# Patient Record
Sex: Female | Born: 1950 | Race: White | Hispanic: No | Marital: Married | Smoking: Never smoker
Health system: Southern US, Community
[De-identification: ages and names within clinical notes are randomized; demographics above are authoritative.]

## PROBLEM LIST (undated history)

## (undated) ENCOUNTER — Emergency Department (HOSPITAL_COMMUNITY): Admission: EM | Payer: PPO | Source: Home / Self Care

## (undated) DIAGNOSIS — R002 Palpitations: Secondary | ICD-10-CM

## (undated) DIAGNOSIS — K7460 Unspecified cirrhosis of liver: Secondary | ICD-10-CM

## (undated) DIAGNOSIS — G43109 Migraine with aura, not intractable, without status migrainosus: Secondary | ICD-10-CM

## (undated) DIAGNOSIS — K7581 Nonalcoholic steatohepatitis (NASH): Secondary | ICD-10-CM

## (undated) DIAGNOSIS — I480 Paroxysmal atrial fibrillation: Secondary | ICD-10-CM

## (undated) DIAGNOSIS — I10 Essential (primary) hypertension: Secondary | ICD-10-CM

## (undated) HISTORY — DX: Palpitations: R00.2

## (undated) HISTORY — DX: Paroxysmal atrial fibrillation: I48.0

---

## 1992-04-02 HISTORY — PX: BREAST BIOPSY: SHX20

## 1998-05-23 ENCOUNTER — Emergency Department (HOSPITAL_COMMUNITY): Admission: EM | Admit: 1998-05-23 | Discharge: 1998-05-23 | Payer: Self-pay | Admitting: Emergency Medicine

## 1998-11-09 ENCOUNTER — Emergency Department (HOSPITAL_COMMUNITY): Admission: EM | Admit: 1998-11-09 | Discharge: 1998-11-09 | Payer: Self-pay | Admitting: Internal Medicine

## 1999-01-08 ENCOUNTER — Emergency Department (HOSPITAL_COMMUNITY): Admission: EM | Admit: 1999-01-08 | Discharge: 1999-01-08 | Payer: Self-pay | Admitting: Emergency Medicine

## 1999-01-08 ENCOUNTER — Encounter: Payer: Self-pay | Admitting: Emergency Medicine

## 1999-06-21 ENCOUNTER — Other Ambulatory Visit: Admission: RE | Admit: 1999-06-21 | Discharge: 1999-06-21 | Payer: Self-pay | Admitting: Obstetrics and Gynecology

## 2000-05-06 ENCOUNTER — Encounter: Payer: Self-pay | Admitting: Emergency Medicine

## 2000-05-06 ENCOUNTER — Emergency Department (HOSPITAL_COMMUNITY): Admission: EM | Admit: 2000-05-06 | Discharge: 2000-05-06 | Payer: Self-pay | Admitting: Emergency Medicine

## 2000-09-02 ENCOUNTER — Encounter: Admission: RE | Admit: 2000-09-02 | Discharge: 2000-09-02 | Payer: Self-pay | Admitting: Obstetrics and Gynecology

## 2000-09-02 ENCOUNTER — Encounter: Payer: Self-pay | Admitting: Obstetrics and Gynecology

## 2001-02-24 ENCOUNTER — Emergency Department (HOSPITAL_COMMUNITY): Admission: EM | Admit: 2001-02-24 | Discharge: 2001-02-24 | Payer: Self-pay | Admitting: Emergency Medicine

## 2001-02-24 ENCOUNTER — Encounter: Payer: Self-pay | Admitting: Emergency Medicine

## 2001-09-23 ENCOUNTER — Other Ambulatory Visit: Admission: RE | Admit: 2001-09-23 | Discharge: 2001-09-23 | Payer: Self-pay | Admitting: Obstetrics and Gynecology

## 2001-09-26 ENCOUNTER — Encounter: Payer: Self-pay | Admitting: Obstetrics and Gynecology

## 2001-09-26 ENCOUNTER — Encounter: Admission: RE | Admit: 2001-09-26 | Discharge: 2001-09-26 | Payer: Self-pay | Admitting: Obstetrics and Gynecology

## 2001-10-01 ENCOUNTER — Encounter (INDEPENDENT_AMBULATORY_CARE_PROVIDER_SITE_OTHER): Payer: Self-pay | Admitting: *Deleted

## 2001-10-01 ENCOUNTER — Ambulatory Visit (HOSPITAL_COMMUNITY): Admission: RE | Admit: 2001-10-01 | Discharge: 2001-10-01 | Payer: Self-pay | Admitting: Gastroenterology

## 2001-11-07 ENCOUNTER — Encounter: Admission: RE | Admit: 2001-11-07 | Discharge: 2001-11-07 | Payer: Self-pay | Admitting: Obstetrics and Gynecology

## 2001-11-07 ENCOUNTER — Encounter: Payer: Self-pay | Admitting: Obstetrics and Gynecology

## 2002-10-11 ENCOUNTER — Emergency Department (HOSPITAL_COMMUNITY): Admission: EM | Admit: 2002-10-11 | Discharge: 2002-10-11 | Payer: Self-pay | Admitting: Emergency Medicine

## 2002-11-16 ENCOUNTER — Other Ambulatory Visit: Admission: RE | Admit: 2002-11-16 | Discharge: 2002-11-16 | Payer: Self-pay | Admitting: Obstetrics and Gynecology

## 2002-12-29 ENCOUNTER — Encounter: Payer: Self-pay | Admitting: Obstetrics and Gynecology

## 2002-12-29 ENCOUNTER — Encounter: Admission: RE | Admit: 2002-12-29 | Discharge: 2002-12-29 | Payer: Self-pay | Admitting: Obstetrics and Gynecology

## 2003-03-23 ENCOUNTER — Emergency Department (HOSPITAL_COMMUNITY): Admission: EM | Admit: 2003-03-23 | Discharge: 2003-03-23 | Payer: Self-pay | Admitting: Emergency Medicine

## 2003-06-02 ENCOUNTER — Encounter: Admission: RE | Admit: 2003-06-02 | Discharge: 2003-06-02 | Payer: Self-pay | Admitting: Internal Medicine

## 2003-12-23 ENCOUNTER — Emergency Department (HOSPITAL_COMMUNITY): Admission: EM | Admit: 2003-12-23 | Discharge: 2003-12-23 | Payer: Self-pay | Admitting: Emergency Medicine

## 2004-02-14 ENCOUNTER — Emergency Department (HOSPITAL_COMMUNITY): Admission: EM | Admit: 2004-02-14 | Discharge: 2004-02-14 | Payer: Self-pay | Admitting: Family Medicine

## 2004-05-05 ENCOUNTER — Emergency Department (HOSPITAL_COMMUNITY): Admission: EM | Admit: 2004-05-05 | Discharge: 2004-05-05 | Payer: Self-pay | Admitting: Emergency Medicine

## 2004-06-05 ENCOUNTER — Other Ambulatory Visit: Admission: RE | Admit: 2004-06-05 | Discharge: 2004-06-05 | Payer: Self-pay | Admitting: Obstetrics and Gynecology

## 2005-02-13 ENCOUNTER — Encounter: Admission: RE | Admit: 2005-02-13 | Discharge: 2005-02-13 | Payer: Self-pay | Admitting: Obstetrics and Gynecology

## 2005-03-12 HISTORY — PX: NM MYOVIEW LTD: HXRAD82

## 2006-06-12 ENCOUNTER — Emergency Department (HOSPITAL_COMMUNITY): Admission: EM | Admit: 2006-06-12 | Discharge: 2006-06-12 | Payer: Self-pay | Admitting: Emergency Medicine

## 2006-09-10 ENCOUNTER — Emergency Department (HOSPITAL_COMMUNITY): Admission: EM | Admit: 2006-09-10 | Discharge: 2006-09-10 | Payer: Self-pay | Admitting: Emergency Medicine

## 2006-11-28 ENCOUNTER — Encounter: Admission: RE | Admit: 2006-11-28 | Discharge: 2006-11-28 | Payer: Self-pay | Admitting: Obstetrics and Gynecology

## 2007-08-26 ENCOUNTER — Emergency Department (HOSPITAL_COMMUNITY): Admission: EM | Admit: 2007-08-26 | Discharge: 2007-08-26 | Payer: Self-pay | Admitting: Family Medicine

## 2007-11-05 ENCOUNTER — Observation Stay (HOSPITAL_COMMUNITY): Admission: EM | Admit: 2007-11-05 | Discharge: 2007-11-05 | Payer: Self-pay | Admitting: *Deleted

## 2007-11-05 HISTORY — PX: CARDIAC CATHETERIZATION: SHX172

## 2007-11-26 HISTORY — PX: US ECHOCARDIOGRAPHY: HXRAD669

## 2008-06-17 ENCOUNTER — Encounter: Admission: RE | Admit: 2008-06-17 | Discharge: 2008-06-17 | Payer: Self-pay | Admitting: Obstetrics and Gynecology

## 2010-02-15 ENCOUNTER — Emergency Department (HOSPITAL_COMMUNITY): Admission: EM | Admit: 2010-02-15 | Discharge: 2010-02-15 | Payer: Self-pay | Admitting: Family Medicine

## 2010-03-11 ENCOUNTER — Emergency Department (HOSPITAL_COMMUNITY)
Admission: EM | Admit: 2010-03-11 | Discharge: 2010-03-11 | Payer: Self-pay | Source: Home / Self Care | Admitting: Family Medicine

## 2010-04-07 ENCOUNTER — Encounter
Admission: RE | Admit: 2010-04-07 | Discharge: 2010-04-07 | Payer: Self-pay | Source: Home / Self Care | Attending: Obstetrics and Gynecology | Admitting: Obstetrics and Gynecology

## 2010-08-15 NOTE — Discharge Summary (Signed)
NAMEFLOREE, ZUNIGA           ACCOUNT NO.:  [id number]   MEDICAL RECORD NO.:  [id number]          PATIENT TYPE:  INP   LOCATION:  2902                         FACILITY:  [facility]   PHYSICIAN:  Madaline Savage, M.D.DATE OF BIRTH:  [birth year]   DATE OF ADMISSION:  11/05/2007  DATE OF DISCHARGE:  11/05/2007                               DISCHARGE SUMMARY   Ms. Isip is a 60 year old Caucasian female patient of Dr. Clarene Duke  presented to the emergency room earlier this morning with complaints of  chest pain.  For the last four days, she had experienced the chest pain  and tightness, smothering sensation usually such episodes would awaken  her from sleep in the morning and then last for several hours and  usually it is not associated with any activity or exertion.  Last night,  she experienced chest tightness with shortness of breath and smothering  sensation.  It was continued for a few hours and eventually the patient  decided to come to the emergency room for evaluation.  She was seen by  Charmian Muff, nurse practitioner and then later Dr. Allyson Sabal saw the patient  and the decision was made to proceed with coronary angiography for  definitive diagnosis.   Dr. Elsie Lincoln performed coronary angio.  It revealed ejection fraction of  60-70%, all the coronaries were free of disease.   Dr. Elsie Lincoln decided to keep the patient in the bed rest and then let him  go home after her bedrest expires if she remains asymptomatic after  ambulation.   HOSPITAL LABORATORY DATA:  Her cardiac enzymes were negative x2.  Hemoglobin was 13.4, hematocrit 39.7, white blood cell count 7.4,  platelet count 251, sodium 142, potassium 3.5, chloride 105, bicarbonate  26, glucose 95, BUN 6, and creatinine 0.78.   DISCHARGE DIAGNOSES:  1. Chest pain ruled out for myocardial infarction status post      catheterization with normal coronaries today.  2. Hypertension.  3. Paroxysmal atrial fibrillation.  4. Mitral  valve prolapse.  5. Possible gastroesophageal reflux disease.   DISCHARGE MEDICATIONS:  1. Metoprolol 100 mg daily.  2. Benicar 20 mg daily.  3. Aspirin 81 mg daily.  4. Allegra 180 mg daily.  5. Vitamin D one pill daily.  6. Protonix 40 mg daily.   The patient will be seen by Dr. Clarene Duke in our office in two weeks and  she was instructed to call and make an appointment.  She was advised to  avoid driving or lifting greater than 5 pounds for three days post cath  and call our office if there are any problems with groin puncture site.      Raymon Mutton, P.A.       ______________________________  Madaline Savage, M.D.    MK/MEDQ  D:  11/05/2007  T:  11/06/2007  Job:  [id number]   cc:   [facility]  Thereasa Solo. Little, M.D.

## 2010-08-15 NOTE — Cardiovascular Report (Signed)
[name], Amanda Frost           ACCOUNT NO.:  [id number]   MEDICAL RECORD NO.:  [id number]          PATIENT TYPE:  INP   LOCATION:  2902                         FACILITY:  [facility]   PHYSICIAN:  Madaline Savage, M.D.DATE OF BIRTH:  1950-12-24   DATE OF PROCEDURE:  11/05/2007  DATE OF DISCHARGE:  11/05/2007                            CARDIAC CATHETERIZATION   PROCEDURES PERFORMED:  1. Selective coronary angiography by Judkins technique.  2. Retrograde left heart catheterization.  3. Left ventricular angiography.   COMPLICATIONS:  None.   ENTRY SITE:  Right femoral.   DYE USED:  Omnipaque.   CATHETERS USED:  5-French Judkins catheters.   COMPLICATIONS:  None.   PATIENT PROFILE:  The patient is a 60 year old white female who is a  medical patient of Dr. Waynard Edwards and a cardiology patient of Dr. Julieanne Manson who has a history of mitral prolapse, palpitations, and  hypertension.  Today, she presented to the [facility] Emergency Room  with chest pain and shortness of breath.  Her EKG showed a normal sinus  rhythm at a rate of 62 and a normal EKG.  Point-of-care markers have  shown a troponin of 0.05 or less.  She has a potassium of 3.3.  Her  creatinine was 0.88.  She was evaluated by Dr. Nanetta Batty in the  emergency room and felt to be in need of catheterization according to  Dr. Hazle Coca evaluation and I performed that procedure in Cath Lab #4  electively and without any complication.  The patient's coronary  arteries were angiographically patent throughout.  The LVEF was 60-70%.  There was no mitral regurgitation.   FINAL IMPRESSIONS:  1. Recent chest pain with several cardiac risk factors.  2. Angiographically patent coronary arteries with a left circumflex      dominant system.  3. Normal left ventricular systolic function.   PLAN:  The patient will be assessed for possible discharge later today  and will follow up with Dr. Waynard Edwards and Dr. Clarene Duke.     ______________________________  Madaline Savage, M.D.     WHG/MEDQ  D:  11/05/2007  T:  11/06/2007  Job:  [id number]   cc:   Loraine Leriche A. Perini, M.D.  Thereasa Solo. Little, M.D.  [facility]

## 2010-08-18 NOTE — Procedures (Signed)
[place]  Patient:    Amanda Frost, Amanda Frost Visit Number: [id number] MRN: [id number]          Service Type: END Location: ENDO Attending Physician:  Rich Brave Dictated by:   Florencia Reasons, M.D. Proc. Date: 10/01/01 Admit Date:  10/01/2001 Discharge Date: 10/01/2001   CC:         Rodrigo Ran, M.D.  Juluis Mire, M.D.   Procedure Report  PROCEDURE:  Colonoscopy with biopsy.  INDICATION:  This is a 60 year old female who had a recent change in bowel habits, which is now pretty well resolved, but who also was desirous of colon cancer screening.  FINDINGS:  Diminutive sessile polyp, biopsied.  Redundant colon.  DESCRIPTION OF PROCEDURE:  The nature, purpose, and risks of the procedure had been discussed with the patient, who provided written consent.  Sedation was fentanyl 80 mcg and Versed 8 mg IV without arrhythmias or desaturation.  The Olympus adjustable-tension pediatric video colonoscope was advanced with considerable looping, such that on future occasions the adult colonoscope might work better.  With the patient in the supine position, right lateral decubitus position, and eventually back in the left lateral decubitus position and with copious external abdominal compression, we were able to reach the base of the cecum and pullback was then performed.  The quality of the prep was quite good, although there was some stool film in the proximal colon.  It is not felt that any significant or sizeable lesions would have been missed.  In the midcolon was a sessile 2-3 mm polyp removed by cold biopsy.  No large polyps, cancer, colitis, vascular malformations, or diverticulosis were noted, and retroflexion in the rectum was unremarkable.  The patient tolerated the procedure well, and there were no apparent complications.  IMPRESSION:  Diminutive sessile polyp, removed as described above.  PLAN:  Await pathology.   Consider follow-up colonoscopy in five years if the patient is adenomatous in character, otherwise perhaps a flexible sigmoidoscopy in five years. Dictated by:   Florencia Reasons, M.D. Attending Physician:  Rich Brave DD:  10/01/01 TD:  10/04/01 Job: [id number]

## 2010-12-29 LAB — BASIC METABOLIC PANEL
BUN: 6
Chloride: 103
GFR calc Af Amer: >60
GFR calc non Af Amer: >60
Potassium: 3.3 — ABNORMAL LOW
Sodium: 141

## 2010-12-29 LAB — COMPREHENSIVE METABOLIC PANEL
ALT: 27
AST: 30
CO2: 26
Calcium: 9.8
Chloride: 105
Creatinine, Ser: 0.78
GFR calc Af Amer: >60
GFR calc non Af Amer: >60
Glucose, Bld: 95
Total Bilirubin: 0.9

## 2010-12-29 LAB — CBC
HCT: 39.7
Hemoglobin: 13.4
MCHC: 33.7
MCV: 92.4
Platelets: 251
RBC: 4.29
RDW: 13.9
WBC: 7.4

## 2010-12-29 LAB — DIFFERENTIAL
Basophils Absolute: 0.1
Basophils Relative: 1
Eosinophils Absolute: 0.1
Eosinophils Relative: 1
Lymphocytes Relative: 40
Lymphs Abs: 3
Monocytes Absolute: 0.6
Monocytes Relative: 8
Neutro Abs: 3.7
Neutrophils Relative %: 50

## 2010-12-29 LAB — CARDIAC PANEL(CRET KIN+CKTOT+MB+TROPI)
CK, MB: 0.8
Total CK: 81
Troponin I: <0.01

## 2010-12-29 LAB — MAGNESIUM: Magnesium: 1.9

## 2010-12-29 LAB — POCT CARDIAC MARKERS
CKMB, poc: <1 — ABNORMAL LOW
Myoglobin, poc: 45.6
Troponin i, poc: <0.05

## 2010-12-29 LAB — CK TOTAL AND CKMB (NOT AT ARMC)
CK, MB: 0.7
Total CK: 98

## 2010-12-29 LAB — LIPID PANEL
LDL Cholesterol: 71
Triglycerides: 145
VLDL: 29

## 2010-12-29 LAB — PROTIME-INR
INR: 0.9
Prothrombin Time: 11.9

## 2010-12-29 LAB — BASIC METABOLIC PANEL WITH GFR
CO2: 27
Calcium: 9.9
Creatinine, Ser: 0.88
Glucose, Bld: 122 — ABNORMAL HIGH

## 2010-12-29 LAB — APTT: aPTT: 25

## 2011-07-11 ENCOUNTER — Other Ambulatory Visit: Payer: Self-pay | Admitting: Obstetrics and Gynecology

## 2011-07-11 DIAGNOSIS — Z1231 Encounter for screening mammogram for malignant neoplasm of breast: Secondary | ICD-10-CM

## 2011-08-02 ENCOUNTER — Ambulatory Visit
Admission: RE | Admit: 2011-08-02 | Discharge: 2011-08-02 | Disposition: A | Discharge status: Home / Self Care | Payer: 59 | Source: Ambulatory Visit | Attending: Obstetrics and Gynecology | Admitting: Obstetrics and Gynecology

## 2011-08-02 DIAGNOSIS — Z1231 Encounter for screening mammogram for malignant neoplasm of breast: Secondary | ICD-10-CM

## 2012-03-25 ENCOUNTER — Encounter (HOSPITAL_COMMUNITY): Payer: Self-pay | Admitting: *Deleted

## 2012-03-25 ENCOUNTER — Emergency Department (HOSPITAL_COMMUNITY)
Admission: EM | Admit: 2012-03-25 | Discharge: 2012-03-25 | Disposition: A | Discharge status: Home / Self Care | Payer: 59 | Attending: Emergency Medicine | Admitting: Emergency Medicine

## 2012-03-25 DIAGNOSIS — Z79899 Other long term (current) drug therapy: Secondary | ICD-10-CM | POA: Insufficient documentation

## 2012-03-25 DIAGNOSIS — H539 Unspecified visual disturbance: Secondary | ICD-10-CM

## 2012-03-25 DIAGNOSIS — I10 Essential (primary) hypertension: Secondary | ICD-10-CM | POA: Insufficient documentation

## 2012-03-25 DIAGNOSIS — Z8679 Personal history of other diseases of the circulatory system: Secondary | ICD-10-CM | POA: Insufficient documentation

## 2012-03-25 HISTORY — DX: Migraine with aura, not intractable, without status migrainosus: G43.109

## 2012-03-25 HISTORY — DX: Essential (primary) hypertension: I10

## 2012-03-25 NOTE — ED Notes (Signed)
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 Never had migraine pain with that, only vision changes with flashing of lights and "zig zag vision". This am began having same symptoms with visual field changes. Took BP med and ASA this am and symptoms went away on way to ED. Pt became anxious and wanted further eval.

## 2012-03-25 NOTE — ED Provider Notes (Signed)
History     CSN: [id number]  Arrival date & time 03/25/12  1039   First MD Initiated Contact with Patient 03/25/12 1116      Chief Complaint  Patient presents with  . Visual Field Change    (Consider location/radiation/quality/duration/timing/severity/associated sxs/prior treatment) HPI  61 year old female with hx of ocular migraine and hypertension presents for evaluation of visual changes.  Pt reports having hx of ocular migraine 25 years ago with only vision changes with flashes of lights and zig zag vision which lasted for 2 weeks and resolved, no headache.  Today she developed same visual field changes with zig zag vision to both eyes, as if she was staring at the bright light.  Her sxs lasted for 10 minutes and resolved without treatment.  No headache.  She denies fever, headache, double vision, curtain closing vision, eye pain, numbness or weakness.  No changes in eye glasses, no recent trauma.  No n/v/d.  She reports she has been performing a lot of housework duty and only has 2 hrs of sleep.  She has an eye specialist, Dr. Elmer Picker.  No prior hx of stroke. No medication changes.    Past Medical History  Diagnosis Date  . Ocular migraine   . Hypertension     History reviewed. No pertinent past surgical history.  No family history on file.  History  Substance Use Topics  . Smoking status: Never Smoker   . Smokeless tobacco: Not on file  . Alcohol Use: No    OB History    Grav Para Term Preterm Abortions TAB SAB Ect Mult Living                  Review of Systems  Constitutional: Negative for chills.  HENT: Negative for ear pain, sore throat and neck pain.   Eyes: Positive for visual disturbance. Negative for photophobia, pain, discharge, redness and itching.  Skin: Negative for rash.  Neurological: Negative for dizziness, tremors, speech difficulty, weakness, numbness and headaches.    Allergies  Sulfa antibiotics  Home Medications   Current Outpatient Rx   Name  Route  Sig  Dispense  Refill  . ASPIRIN 325 MG PO TABS   Oral   Take 325 mg by mouth every 6 (six) hours as needed. Only takes if she feels if she's having a stroke.  Happened last 24 years ago         . METOPROLOL SUCCINATE ER 100 MG PO TB24   Oral   Take 100 mg by mouth daily. Take with or immediately following a meal.         . OLMESARTAN MEDOXOMIL 20 MG PO TABS   Oral   Take 20 mg by mouth daily.         Marland Kitchen VITAMIN D (ERGOCALCIFEROL) 50000 UNITS PO CAPS   Oral   Take 50,000 Units by mouth every 7 (seven) days. Fridays or Saturdays           BP 156/71  Pulse 64  Temp 98.2 F (36.8 C) (Oral)  Resp 18  SpO2 100%  Physical Exam  Nursing note and vitals reviewed. Constitutional: She is oriented to person, place, and time. She appears well-developed and well-nourished.  HENT:  Head: Normocephalic and atraumatic.  Left Ear: External ear normal.  Mouth/Throat: Oropharynx is clear and moist. No oropharyngeal exudate.  Eyes: Conjunctivae normal, EOM and lids are normal. Pupils are equal, round, and reactive to light. No foreign bodies found. Right eye  exhibits no discharge. No foreign body present in the right eye. Left eye exhibits no discharge. No foreign body present in the left eye. Right conjunctiva is not injected. Left conjunctiva is not injected. No scleral icterus. Right eye exhibits normal extraocular motion and no nystagmus. Left eye exhibits normal extraocular motion and no nystagmus. Pupils are equal.  Fundoscopic exam:      The right eye shows no arteriolar narrowing, no hemorrhage and no papilledema.       The left eye shows no arteriolar narrowing, no hemorrhage and no papilledema.  Neck: Neck supple.  Lymphadenopathy:    She has no cervical adenopathy.  Neurological: She is alert and oriented to person, place, and time.  Skin: Skin is warm. No rash noted.    ED Course  Procedures (including critical care time)  Labs Reviewed - No data to  display No results found.   No diagnosis found.  1. Vision changes  MDM  Pt with transient vision changes lasting 10 minutes, now at baseline.  No other complaints.  Has similar sxs 25 years ago which was thought to be due to hormonal changes after child birth.    She has normal visual acuity (20/20 corrected).  Eye examination unremarkable.  No focal neuro deficits. BP unremarkable.  I discussed with my attending, and we felt pt will benefit f/u with her ophthalmologist outpatient for further care.  No treatment here today.   BP 156/71  Pulse 64  Temp 98.2 F (36.8 C) (Oral)  Resp 18  SpO2 100%        Fayrene Helper, PA-C 03/25/12 1220

## 2012-03-25 NOTE — ED Provider Notes (Signed)
Medical screening examination/treatment/procedure(s) were performed by non-physician practitioner and as supervising physician I was immediately available for consultation/collaboration.  Ethelda Chick, MD 03/25/12 [phone]

## 2012-09-12 ENCOUNTER — Other Ambulatory Visit: Payer: Self-pay | Admitting: Gastroenterology

## 2012-10-26 ENCOUNTER — Encounter: Payer: Self-pay | Admitting: *Deleted

## 2012-10-28 ENCOUNTER — Encounter: Payer: Self-pay | Admitting: Cardiovascular Disease

## 2012-10-29 ENCOUNTER — Encounter: Payer: Self-pay | Admitting: Cardiovascular Disease

## 2012-10-29 ENCOUNTER — Ambulatory Visit (INDEPENDENT_AMBULATORY_CARE_PROVIDER_SITE_OTHER): Payer: 59 | Admitting: Cardiovascular Disease

## 2012-10-29 VITALS — BP 160/92 | HR 61 | Resp 16 | Ht 68.0 in | Wt 182.9 lb

## 2012-10-29 DIAGNOSIS — R002 Palpitations: Secondary | ICD-10-CM

## 2012-10-29 DIAGNOSIS — I471 Supraventricular tachycardia: Secondary | ICD-10-CM

## 2012-10-29 DIAGNOSIS — I10 Essential (primary) hypertension: Secondary | ICD-10-CM

## 2012-10-29 NOTE — Patient Instructions (Signed)
Your physician recommends that you schedule a follow-up appointment in: 1 year. Please take your blood pressure medications every day, preferably around the same time every day. Abruptly skipping metoprolol can cause rebound hypertension.

## 2012-10-29 NOTE — Progress Notes (Signed)
Patient ID: Amanda Frost, female   DOB: April 19, 1950, 62 y.o.   MRN: [id number]     Reason for office visit Hypertension, palpitations, paroxysmal atrial tach  The patient has long-standing systemic hypertension with little if any structural heart disease. There is borderline LVH by echo but normal diastolic function and normal left atrial size. The ventricular ejection fraction was normal. She had normal coronary arteries by previous cardiac catheterization.  As always she is troubled by intermittent palpitations, often related to caffeine use. These are uniformly brief and are not associated with dyspnea, chest pain, syncope or focal neurological symptoms.  She is worried because she has seen numerous commercials for products for atrial fibrillation on television. She is also worried because both her mother and maternal grandmother had strokes.  She has never had a stroke or TIA. She is concerned that she may have atrial fibrillation. For similar symptoms and event monitor was done several years ago that showed frequent PACs and occasional very brief bursts of atrial couplets and rare atrial tachycardia. Atrial fibrillation was not seen.  Pressure is high today. She did not take her medications yesterday. States that she'll sometimes skip her medicines and her blood pressure is "low" (she is actually reporting normal range values that she considers to be low)    Allergies  Allergen Reactions  . Sulfa Antibiotics Other (See Comments)    Unsure, childhood reaction    Current Outpatient Prescriptions  Medication Sig Dispense Refill  . fexofenadine (ALLEGRA) 180 MG tablet Take 180 mg by mouth daily as needed.      . metoprolol succinate (TOPROL-XL) 100 MG 24 hr tablet Take 100 mg by mouth daily. Take with or immediately following a meal.      . Multiple Vitamin (MULTIVITAMIN) tablet Take 1 tablet by mouth daily.      Marland Kitchen olmesartan (BENICAR) 20 MG tablet Take 20 mg by mouth daily.        . Vitamin D, Ergocalciferol, (DRISDOL) 50000 UNITS CAPS Take 50,000 Units by mouth every 7 (seven) days. Fridays or Saturdays       No current facility-administered medications for this visit.    Past Medical History  Diagnosis Date  . Ocular migraine   . Hypertension   . Palpitations     PAC's,PVC's,atrial tachycardia    Past Surgical History  Procedure Laterality Date  . Breast biopsy  1994  . Cardiac catheterization  11/05/2007    normal LV fx,patent coronary arteries  . US echocardiography  11/26/2007    mild LVH,trace MR,TR  . Nm myoview ltd  03/12/2005    normal    Family History  Problem Relation Age of Onset  . Diabetes Mother   . Hypertension Mother   . Kidney disease Mother   . Diabetes Father   . Hypertension Father     History   Social History  . Marital Status: Married    Spouse Name: N/A    Number of Children: N/A  . Years of Education: N/A   Occupational History  . Not on file.   Social History Main Topics  . Smoking status: Never Smoker   . Smokeless tobacco: Not on file  . Alcohol Use: No  . Drug Use: No  . Sexually Active:    Other Topics Concern  . Not on file   Social History Narrative  . No narrative on file    Review of systems: The patient specifically denies any chest pain at rest or with exertion,  dyspnea at rest or with exertion, orthopnea, paroxysmal nocturnal dyspnea, syncope, focal neurological deficits, intermittent claudication, lower extremity edema, unexplained weight gain, cough, hemoptysis or wheezing.  The patient also denies abdominal pain, nausea, vomiting, dysphagia, diarrhea, constipation, polyuria, polydipsia, dysuria, hematuria, frequency, urgency, abnormal bleeding or bruising, fever, chills, unexpected weight changes, mood swings, change in skin or hair texture, change in voice quality, auditory or visual problems, allergic reactions or rashes, new musculoskeletal complaints other than usual "aches and  pains".   PHYSICAL EXAM BP 160/92  Pulse 61  Resp 16  Ht 5\' 8"  (1.727 m)  Wt 182 lb 14.4 oz (82.963 kg)  BMI 27.82 kg/m2  General: Alert, oriented x3, no distress Head: no evidence of trauma, PERRL, EOMI, no exophtalmos or lid lag, no myxedema, no xanthelasma; normal ears, nose and oropharynx Neck: normal jugular venous pulsations and no hepatojugular reflux; brisk carotid pulses without delay and no carotid bruits Chest: clear to auscultation, no signs of consolidation by percussion or palpation, normal fremitus, symmetrical and full respiratory excursions Cardiovascular: normal position and quality of the apical impulse, regular rhythm, normal first and second heart sounds, no murmurs, rubs or gallops Abdomen: no tenderness or distention, no masses by palpation, no abnormal pulsatility or arterial bruits, normal bowel sounds, no hepatosplenomegaly Extremities: no clubbing, cyanosis or edema; 2+ radial, ulnar and brachial pulses bilaterally; 2+ right femoral, posterior tibial and dorsalis pedis pulses; 2+ left femoral, posterior tibial and dorsalis pedis pulses; no subclavian or femoral bruits Neurological: grossly nonfocal   EKG: NSR, normal  Lipid Panel     Component Value Date/Time   CHOL  Value: 136        ATP III CLASSIFICATION:  <200     mg/dL   Desirable  161-096  mg/dL   Borderline High  >=045    mg/dL   High 4/0/9811 9147   TRIG 145 11/05/2007 0821   HDL 36* 11/05/2007 0821   CHOLHDL 3.8 11/05/2007 0821   VLDL 29 11/05/2007 0821   LDLCALC  Value: 71        Total Cholesterol/HDL:CHD Risk Coronary Heart Disease Risk Table                     Men   Women  1/2 Average Risk   3.4   3.3 11/05/2007 0821    BMET    Component Value Date/Time   NA 142 11/05/2007 0821   K 3.5 11/05/2007 0821   CL 105 11/05/2007 0821   CO2 26 11/05/2007 0821   GLUCOSE 95 11/05/2007 0821   BUN 6 11/05/2007 0821   CREATININE 0.78 11/05/2007 0821   CALCIUM 9.8 11/05/2007 0821   GFRNONAA >60 11/05/2007 0821   GFRAA   Value: >60        The eGFR has been calculated using the MDRD equation. This calculation has not been validated in all clinical 11/05/2007 0821     ASSESSMENT AND PLAN Paroxysmal atrial tachycardia Her palpitations are generally brief and this would be unusual with atrial fibrillation. She has very little if any structural heart disease. She does not have any serious valvular problems. Left atrial size is normal. Left ventricular systolic function is normal. I suspect that her symptoms are, as before related to premature atrial contractions and possibly brief bursts of atrial tachycardia. I cannot fully exclude that she's having bursts of atrial fibrillation and have offered monitoring either with an external event monitor or with an implantable loop recorder. I also discussed the  fact that she is at low risk of embolic complications, even if she had atrial fibrillation. Both by the CHADS2 and CHADS2Vasc2 scoring system she would fall in the category that would takes only aspirin for embolism prevention. After we discussed these considerations she feels reassured and rather not have any additional testing  HTN (hypertension) Her blood pressure is high today because she missed her previous dose of medications. She tells me that sometimes at home if she sees that her blood pressure is low normal she'll simply skip the medicine. I have told her that this is not a wise thing to do. She may have rebound hypertension from abrupt cessation of beta blocker therapy. She does not seem to have any symptoms of hypotension I would simply recommend taking the medications on a regular daily basis. If her medications need to be adjusted should be done chronically and not intermittently. View the importance of sodium restriction, weight loss, regular exercise and a general healthy lifestyle.  Orders Placed This Encounter  Procedures  . EKG 12-Lead   No orders of the defined types were placed in this encounter.     Junious Silk, MD, [facility] [phone] office [phone] pager

## 2012-10-29 NOTE — Assessment & Plan Note (Addendum)
Her palpitations are generally brief and this would be unusual with atrial fibrillation. She has very little if any structural heart disease. She does not have any serious valvular problems. Left atrial size is normal. Left ventricular systolic function is normal. I suspect that her symptoms are, as before related to premature atrial contractions and possibly brief bursts of atrial tachycardia. I cannot fully exclude that she's having bursts of atrial fibrillation and have offered monitoring either with an external event monitor or with an implantable loop recorder. I also discussed the fact that she is at low risk of embolic complications, even if she had atrial fibrillation. Both by the CHADS2 and CHADS2Vasc2 scoring system she would fall in the category that would takes only aspirin for embolism prevention. After we discussed these considerations she feels reassured and rather not have any additional testing

## 2012-10-29 NOTE — Assessment & Plan Note (Signed)
Her blood pressure is high today because she missed her previous dose of medications. She tells me that sometimes at home if she sees that her blood pressure is low normal she'll simply skip the medicine. I have told her that this is not a wise thing to do. She may have rebound hypertension from abrupt cessation of beta blocker therapy. She does not seem to have any symptoms of hypotension I would simply recommend taking the medications on a regular daily basis. If her medications need to be adjusted should be done chronically and not intermittently. View the importance of sodium restriction, weight loss, regular exercise and a general healthy lifestyle.

## 2012-12-05 ENCOUNTER — Ambulatory Visit (INDEPENDENT_AMBULATORY_CARE_PROVIDER_SITE_OTHER): Payer: 59 | Admitting: Cardiovascular Disease

## 2012-12-05 ENCOUNTER — Encounter: Payer: Self-pay | Admitting: Cardiovascular Disease

## 2012-12-05 VITALS — BP 120/90 | HR 65 | Ht 68.0 in | Wt 180.9 lb

## 2012-12-05 DIAGNOSIS — I10 Essential (primary) hypertension: Secondary | ICD-10-CM

## 2012-12-05 DIAGNOSIS — I4719 Other supraventricular tachycardia: Secondary | ICD-10-CM

## 2012-12-05 DIAGNOSIS — I471 Supraventricular tachycardia: Secondary | ICD-10-CM

## 2012-12-05 DIAGNOSIS — R0602 Shortness of breath: Secondary | ICD-10-CM

## 2012-12-05 NOTE — Patient Instructions (Addendum)
Your physician has recommended that you have a pulmonary function test. Pulmonary Function Tests are a group of tests that measure how well air moves in and out of your lungs.  Your physician has requested that you have an echocardiogram. Echocardiography is a painless test that uses sound waves to create images of your heart. It provides your doctor with information about the size and shape of your heart and how well your heart's chambers and valves are working. This procedure takes approximately one hour. There are no restrictions for this procedure.  Your physician recommends that you schedule a follow-up appointment in: 6 months

## 2012-12-06 NOTE — Progress Notes (Signed)
Patient ID: Amanda Frost, female   DOB: [birth year], 62 y.o.   MRN: [id number]      Reason for office visit Shortness of breath and palpitations, presyncope  The patient is preparing for her son's wedding and has been taking dance lessons. She is noticed that she becomes extremely short of breath with this activity. She cannot keep up with her husband. She sawSue Drinkard NP and was diagnosed with sinusitis; treated with an antibiotic, albuterol inhaler and scheduled for PFT (chest not yet been performed). Since then she has noted in consistent chest discomfort with exertion, heart racing/punding and becomes lightheaded for split second.  A nuclear stress test in 2006 was normal. An echocardiogram in 2009 was normal. Coronary angiography in 2009 showed normal coronary arteries. Note dominant left circumflex system.  An arrhythmia monitor in the past showed PVCs, PACs and some atrial tachycardia, but did not show atrial fibrillation.    Allergies  Allergen Reactions  . Sulfa Antibiotics Other (See Comments)    Unsure, childhood reaction    Current Outpatient Prescriptions  Medication Sig Dispense Refill  . azithromycin (ZITHROMAX) 250 MG tablet 250 mg.      . fexofenadine (ALLEGRA) 180 MG tablet Take 180 mg by mouth daily as needed.      . meclizine (ANTIVERT) 25 MG tablet       . metoprolol succinate (TOPROL-XL) 100 MG 24 hr tablet Take 100 mg by mouth daily. Take with or immediately following a meal.      . Multiple Vitamin (MULTIVITAMIN) tablet Take 1 tablet by mouth daily.      Marland Kitchen olmesartan (BENICAR) 20 MG tablet Take 20 mg by mouth daily.      . Vitamin D, Ergocalciferol, (DRISDOL) 50000 UNITS CAPS Take 50,000 Units by mouth every 7 (seven) days. Fridays or Saturdays       No current facility-administered medications for this visit.    Past Medical History  Diagnosis Date  . Ocular migraine   . Hypertension   . Palpitations     PAC's,PVC's,atrial tachycardia    Past  Surgical History  Procedure Laterality Date  . Breast biopsy  1994  . Cardiac catheterization  11/05/2007    normal LV fx,patent coronary arteries  . US echocardiography  11/26/2007    mild LVH,trace MR,TR  . Nm myoview ltd  03/12/2005    normal    Family History  Problem Relation Age of Onset  . Diabetes Mother   . Hypertension Mother   . Kidney disease Mother   . Diabetes Father   . Hypertension Father     History   Social History  . Marital Status: Married    Spouse Name: N/A    Number of Children: N/A  . Years of Education: N/A   Occupational History  . Not on file.   Social History Main Topics  . Smoking status: Never Smoker   . Smokeless tobacco: Not on file  . Alcohol Use: No  . Drug Use: No  . Sexual Activity:    Other Topics Concern  . Not on file   Social History Narrative  . No narrative on file    Review of systems: The patient specifically denies any chest pain at rest , dyspnea at rest , orthopnea, paroxysmal nocturnal dyspnea, syncope,  focal neurological deficits, intermittent claudication, lower extremity edema, unexplained weight gain, cough, hemoptysis or wheezing.  The patient also denies abdominal pain, nausea, vomiting, dysphagia, diarrhea, constipation, polyuria, polydipsia, dysuria, hematuria, frequency, urgency,  abnormal bleeding or bruising, fever, chills, unexpected weight changes, mood swings, change in skin or hair texture, change in voice quality, auditory or visual problems, allergic reactions or rashes, new musculoskeletal complaints other than usual "aches and pains".   PHYSICAL EXAM BP 120/90  Pulse 65  Ht 5\' 8"  (1.727 m)  Wt 180 lb 14.4 oz (82.056 kg)  BMI 27.51 kg/m2  General: Alert, oriented x3, no distress Head: no evidence of trauma, PERRL, EOMI, no exophtalmos or lid lag, no myxedema, no xanthelasma; normal ears, nose and oropharynx Neck: normal jugular venous pulsations and no hepatojugular reflux; brisk carotid pulses  without delay and no carotid bruits Chest: clear to auscultation, no signs of consolidation by percussion or palpation, normal fremitus, symmetrical and full respiratory excursions Cardiovascular: normal position and quality of the apical impulse, regular rhythm, normal first and second heart sounds, no murmurs, rubs or gallops Abdomen: no tenderness or distention, no masses by palpation, no abnormal pulsatility or arterial bruits, normal bowel sounds, no hepatosplenomegaly Extremities: no clubbing, cyanosis or edema; 2+ radial, ulnar and brachial pulses bilaterally; 2+ right femoral, posterior tibial and dorsalis pedis pulses; 2+ left femoral, posterior tibial and dorsalis pedis pulses; no subclavian or femoral bruits Neurological: grossly nonfocal   EKG: NSR, normal  Lipid Panel     Component Value Date/Time   CHOL  Value: 136        ATP III CLASSIFICATION:  <200     mg/dL   Desirable  409-811  mg/dL   Borderline High  >=914    mg/dL   High 10/08/2954 2130   TRIG 145 11/05/2007 0821   HDL 36* 11/05/2007 0821   CHOLHDL 3.8 11/05/2007 0821   VLDL 29 11/05/2007 0821   LDLCALC  Value: 71        Total Cholesterol/HDL:CHD Risk Coronary Heart Disease Risk Table                     Men   Women  1/2 Average Risk   3.4   3.3 11/05/2007 0821    BMET    Component Value Date/Time   NA 142 11/05/2007 0821   K 3.5 11/05/2007 0821   CL 105 11/05/2007 0821   CO2 26 11/05/2007 0821   GLUCOSE 95 11/05/2007 0821   BUN 6 11/05/2007 0821   CREATININE 0.78 11/05/2007 0821   CALCIUM 9.8 11/05/2007 0821   GFRNONAA >60 11/05/2007 0821   GFRAA  Value: >60        The eGFR has been calculated using the MDRD equation. This calculation has not been validated in all clinical 11/05/2007 0821     ASSESSMENT AND PLAN  Have recommended that she have a repeat echocardiogram. Her exertional symptoms might be related to cardiac problems, although deconditioning is probably also playing a role. Her palpitations might have been exacerbated by the  bronchodilator inhaler. We will expedite her pulmonary function tests as well.   Orders Placed This Encounter  Procedures  . PFT ONLY (MET-TEST)  . EKG 12-Lead  . 2D Echocardiogram with contrast   Meds ordered this encounter  Medications  . azithromycin (ZITHROMAX) 250 MG tablet    Sig: 250 mg.  . meclizine (ANTIVERT) 25 MG tablet    Sig:     Abie Killian  Thurmon Fair, MD, [facility] [phone] office [phone] pager

## 2012-12-11 ENCOUNTER — Ambulatory Visit (HOSPITAL_COMMUNITY)
Admission: RE | Admit: 2012-12-11 | Discharge: 2012-12-11 | Disposition: A | Discharge status: Home / Self Care | Payer: 59 | Source: Ambulatory Visit | Attending: Cardiovascular Disease | Admitting: Cardiovascular Disease

## 2012-12-11 DIAGNOSIS — I10 Essential (primary) hypertension: Secondary | ICD-10-CM | POA: Insufficient documentation

## 2012-12-11 DIAGNOSIS — R0989 Other specified symptoms and signs involving the circulatory and respiratory systems: Secondary | ICD-10-CM | POA: Insufficient documentation

## 2012-12-11 DIAGNOSIS — R0602 Shortness of breath: Secondary | ICD-10-CM

## 2012-12-11 DIAGNOSIS — R0609 Other forms of dyspnea: Secondary | ICD-10-CM | POA: Insufficient documentation

## 2012-12-11 DIAGNOSIS — I498 Other specified cardiac arrhythmias: Secondary | ICD-10-CM | POA: Insufficient documentation

## 2012-12-11 DIAGNOSIS — I471 Supraventricular tachycardia: Secondary | ICD-10-CM

## 2012-12-11 DIAGNOSIS — R000 Tachycardia, unspecified: Secondary | ICD-10-CM

## 2012-12-11 NOTE — Progress Notes (Signed)
2D Echo Performed 12/11/2012    Ronald Londo, RCS  

## 2012-12-15 ENCOUNTER — Telehealth: Payer: Self-pay | Admitting: Cardiovascular Disease

## 2012-12-15 DIAGNOSIS — R0602 Shortness of breath: Secondary | ICD-10-CM

## 2012-12-15 NOTE — Telephone Encounter (Signed)
Returned call.  Pt informed message received and unfortunately this test is done only on Mondays.  Pt stated she was told to have it done before she goes on vacation and will be gone for a week and a half.  Stated she didn't know if Dr. Salena Saner had any restrictions for her since she can't get the test done.  Pt informed Dr. Salena Saner will be notified in case she has certain restrictions he would like her to follow while on vacation.  Pt verbalized understanding and agreed w/ plan.  Pt was also given echo results and verbalized understanding.  Message forwarded to Dr. Royann Shivers.

## 2012-12-15 NOTE — Telephone Encounter (Signed)
Patient had Met-Test scheduled for today but [place] out.  Per patient Dr. Salena Saner wanted her to have this done prior to going out of town on Wednesday.  She had echo last week.  What to do?

## 2012-12-15 NOTE — Telephone Encounter (Signed)
Please bring her in for a plain Bruce protocol treadmill stress test as soon as the schedule permits, preferably Tuesday

## 2012-12-16 ENCOUNTER — Ambulatory Visit (HOSPITAL_COMMUNITY)
Admission: RE | Admit: 2012-12-16 | Discharge: 2012-12-16 | Disposition: A | Discharge status: Home / Self Care | Payer: 59 | Source: Ambulatory Visit | Attending: Cardiovascular Disease | Admitting: Cardiovascular Disease

## 2012-12-16 DIAGNOSIS — R0602 Shortness of breath: Secondary | ICD-10-CM

## 2012-12-16 NOTE — Telephone Encounter (Signed)
Scheduling notified.  Order placed and appt scheduled for today at 3pm.  No caffeine or tobacco products 3 hrs before test.  No perfume or heels.  Call to pt and informed per Dr. Royann Shivers.  Pt verbalized understanding and agreed w/ plan.

## 2014-01-10 ENCOUNTER — Telehealth: Payer: Self-pay | Admitting: Cardiology

## 2014-01-10 NOTE — Telephone Encounter (Signed)
Paged by patient who [place] and developed AF.  She is currently hospitalized with plan for TEE DCCV. She feels well on the whole. She wanted to know details of the procedure including if she should wait to have the procedure by Dr. Loletha Grayer. Given she is remains in rapid AF despite attempts at rate control I recommended proceed with with DCCV locally. All questions answered. She will schedule follow-up with Dr. Loletha Grayer for after discharge.

## 2014-01-12 ENCOUNTER — Telehealth: Payer: Self-pay | Admitting: *Deleted

## 2014-01-12 ENCOUNTER — Telehealth: Payer: Self-pay | Admitting: Cardiovascular Disease

## 2014-01-12 NOTE — Telephone Encounter (Signed)
Given signed release of information form to obtain patients hospital records from [facility] on 01/12/14 lp

## 2014-01-12 NOTE — Telephone Encounter (Signed)
Pt called in stating that while she was on vacation that she went in to Afib and was hospitalized for 2 days. She says that she received shock therapy to regulated her heart rate(which was 160bpm). She was recommended by the doctor to be seen by her cardiologist as soon as possible.   Dr. Loletha Grayer nor the extenders have anything available any time soon. Please call  Thanks

## 2014-01-12 NOTE — Telephone Encounter (Signed)
Spoke with pt, she was in [facility], aware we will get her records and then will be able to get her an appt to be seen. Pt agreed with this plan.  Release of information given to medical records

## 2014-01-12 NOTE — Telephone Encounter (Signed)
Amanda Frost called patient to let her know a records release has been sent to the [facility].  She is currently on her way back to town.  Wants to be seen ASAP.  She does not think she is back in Atrial Fib but wants to discuss everything going on. If we get the records before Thursday we will work in with Dr. Loletha Grayer at 10:30am.

## 2014-01-14 ENCOUNTER — Telehealth: Payer: Self-pay | Admitting: Cardiovascular Disease

## 2014-01-14 MED ORDER — RIVAROXABAN 20 MG PO TABS
20.0000 mg | ORAL_TABLET | Freq: Every day | ORAL | Status: DC
Start: 2014-01-14 — End: 2014-01-20

## 2014-01-14 NOTE — Telephone Encounter (Signed)
Please offer her an appointment with a PA or NP on a day when I am in the office

## 2014-01-14 NOTE — Telephone Encounter (Signed)
Returned call to patient Dr.Croitoru advised schedule appointment with a extender on a day when he is in office.Schedulers will call back to schedule appointment.Xarelto 20 mg prescription sent to pharmacy.

## 2014-01-14 NOTE — Telephone Encounter (Signed)
Patient notified by Dalene Seltzer that Dr. Loletha Grayer has reviewed her records from [facility].  Does not need to be worked in urgently, on appropriate meds and schedule next available appt.  Instructed to call if she has any problems.

## 2014-01-14 NOTE — Telephone Encounter (Signed)
Pt called in stating that she was given Xarelto 20 mg while in the hospital which had no refills. She would like to get a new prescription for this medication called and sent in on 11/11,she says that she will be out around that time. She would like the medication refilled at the [facility] on [address]. Please call  Thanks

## 2014-01-14 NOTE — Telephone Encounter (Signed)
Returned call to patient she stated she was discharged from [facility] in [place] this past Monday 01/11/14 with new onset atrial fib.Stated she was prescribed Xarelto 20 mg daily.Stated appointment was scheduled with Dr.Croitoru 03/05/14 at 9:45 am.Stated she had several questions for Dr.Croitoru and would like a sooner appointment.Stated she was told at discharge she should follow up with him in 2 weeks.Also needs a [facility].Message sent to Dr.Croitoru.

## 2014-01-15 ENCOUNTER — Emergency Department (HOSPITAL_COMMUNITY): Payer: 59

## 2014-01-15 ENCOUNTER — Emergency Department (HOSPITAL_COMMUNITY)
Admission: EM | Admit: 2014-01-15 | Discharge: 2014-01-15 | Disposition: A | Discharge status: Home / Self Care | Payer: 59 | Attending: Emergency Medicine | Admitting: Emergency Medicine

## 2014-01-15 ENCOUNTER — Telehealth: Payer: Self-pay | Admitting: Cardiovascular Disease

## 2014-01-15 ENCOUNTER — Encounter (HOSPITAL_COMMUNITY): Payer: Self-pay | Admitting: Emergency Medicine

## 2014-01-15 DIAGNOSIS — R079 Chest pain, unspecified: Secondary | ICD-10-CM | POA: Diagnosis present

## 2014-01-15 DIAGNOSIS — I10 Essential (primary) hypertension: Secondary | ICD-10-CM | POA: Insufficient documentation

## 2014-01-15 DIAGNOSIS — Z9889 Other specified postprocedural states: Secondary | ICD-10-CM | POA: Insufficient documentation

## 2014-01-15 DIAGNOSIS — Z79899 Other long term (current) drug therapy: Secondary | ICD-10-CM | POA: Insufficient documentation

## 2014-01-15 DIAGNOSIS — I4891 Unspecified atrial fibrillation: Secondary | ICD-10-CM | POA: Insufficient documentation

## 2014-01-15 DIAGNOSIS — Z8719 Personal history of other diseases of the digestive system: Secondary | ICD-10-CM | POA: Diagnosis not present

## 2014-01-15 DIAGNOSIS — G43819 Other migraine, intractable, without status migrainosus: Secondary | ICD-10-CM | POA: Insufficient documentation

## 2014-01-15 HISTORY — DX: Nonalcoholic steatohepatitis (NASH): K75.81

## 2014-01-15 HISTORY — DX: Unspecified cirrhosis of liver: K74.60

## 2014-01-15 LAB — CBC WITH DIFFERENTIAL/PLATELET
Basophils Absolute: 0 10*3/uL (ref 0.0–0.1)
Basophils Relative: 1 % (ref 0–1)
Eosinophils Absolute: 0.1 10*3/uL (ref 0.0–0.7)
Eosinophils Relative: 2 % (ref 0–5)
HEMATOCRIT: 42 % (ref 36.0–46.0)
Hemoglobin: 13.8 g/dL (ref 12.0–15.0)
LYMPHS ABS: 1.9 10*3/uL (ref 0.7–4.0)
LYMPHS PCT: 37 % (ref 12–46)
MCH: 29.9 pg (ref 26.0–34.0)
MCHC: 32.9 g/dL (ref 30.0–36.0)
MCV: 91.1 fL (ref 78.0–100.0)
MONO ABS: 0.4 10*3/uL (ref 0.1–1.0)
Monocytes Relative: 8 % (ref 3–12)
NEUTROS ABS: 2.7 10*3/uL (ref 1.7–7.7)
Neutrophils Relative %: 52 % (ref 43–77)
Platelets: 252 10*3/uL (ref 150–400)
RBC: 4.61 MIL/uL (ref 3.87–5.11)
RDW: 13.9 % (ref 11.5–15.5)
WBC: 5.2 10*3/uL (ref 4.0–10.5)

## 2014-01-15 LAB — COMPREHENSIVE METABOLIC PANEL
ALK PHOS: 157 U/L — AB (ref 39–117)
ALT: 179 U/L — AB (ref 0–35)
AST: 173 U/L — ABNORMAL HIGH (ref 0–37)
Albumin: 4.2 g/dL (ref 3.5–5.2)
Anion gap: 14 (ref 5–15)
BUN: 10 mg/dL (ref 6–23)
CHLORIDE: 102 meq/L (ref 96–112)
CO2: 23 mEq/L (ref 19–32)
Calcium: 9.9 mg/dL (ref 8.4–10.5)
Creatinine, Ser: 0.77 mg/dL (ref 0.50–1.10)
GFR calc Af Amer: >90 mL/min (ref >90)
GFR calc non Af Amer: 88 mL/min — ABNORMAL LOW (ref >90)
GLUCOSE: 113 mg/dL — AB (ref 70–99)
POTASSIUM: 4.4 meq/L (ref 3.7–5.3)
Sodium: 139 mEq/L (ref 137–147)
TOTAL PROTEIN: 7.8 g/dL (ref 6.0–8.3)
Total Bilirubin: 1.3 mg/dL — ABNORMAL HIGH (ref 0.3–1.2)

## 2014-01-15 LAB — LIPASE, BLOOD: Lipase: 37 U/L (ref 11–59)

## 2014-01-15 LAB — I-STAT TROPONIN, ED
TROPONIN I, POC: 0.01 ng/mL (ref 0.00–0.08)
Troponin i, poc: 0.01 ng/mL (ref 0.00–0.08)

## 2014-01-15 NOTE — Discharge Instructions (Signed)
Continue all of your regular medications. Follow-up with Dr. Sallyanne Kuster next week as previously scheduled. Return to the ED for new concerns.

## 2014-01-15 NOTE — ED Provider Notes (Signed)
CSN: [id number]     Arrival date & time 01/15/14  1138 History   First MD Initiated Contact with Patient 01/15/14 1145     Chief Complaint  Patient presents with  . Chest Pain     (Consider location/radiation/quality/duration/timing/severity/associated sxs/prior Treatment) Patient is a 63 y.o. female presenting with chest pain. The history is provided by the patient and medical records.  Chest Pain  This is a 63 year old female with past history significant for palpitations, and newly diagnosed A. Fib on 01/11/14, presented to the ED for chest pain. Patient states she had an episode of midsternal chest pain earlier this morning. There was some radiation to her upper abdomen. She denies any shortness of breath, dizziness, paresthesias, weakness, nausea, diaphoresis, or vomiting. States pain lasted for a few minutes before resolving completely.  Denies recent cough, fever, chills, illness.  No known sick contacts. Patient is followed by cardiology, Dr. Sallyanne Kuster, scheduled to see him next week on 01/20/14.  Patient does note she has had palpitations intermittently since age 5. She has worn event monitor twice, and told that all episodes were benign. Most recent echocardiogram by Dr. Sallyanne Kuster 12-11-12 with EF estimated at 60-65%.  Patient also reports negative stress test last year.  VS stable on arrival.  Past Medical History  Diagnosis Date  . Ocular migraine   . Hypertension   . Palpitations     PAC's,PVC's,atrial tachycardia  . A-fib   . Liver cirrhosis secondary to NASH (nonalcoholic steatohepatitis)    Past Surgical History  Procedure Laterality Date  . Breast biopsy  1994  . Cardiac catheterization  11/05/2007    normal LV fx,patent coronary arteries  . US echocardiography  11/26/2007    mild LVH,trace MR,TR  . Nm myoview ltd  03/12/2005    normal   Family History  Problem Relation Age of Onset  . Diabetes Mother   . Hypertension Mother   . Kidney disease Mother   . Diabetes  Father   . Hypertension Father    History  Substance Use Topics  . Smoking status: Never Smoker   . Smokeless tobacco: Not on file  . Alcohol Use: No   OB History   Grav Para Term Preterm Abortions TAB SAB Ect Mult Living                 Review of Systems  Cardiovascular: Positive for chest pain.  All other systems reviewed and are negative.     Allergies  Sulfa antibiotics  Home Medications   Prior to Admission medications   Medication Sig Start Date End Date Taking? Authorizing Provider  azithromycin (ZITHROMAX) 250 MG tablet 250 mg. 12/04/12   Historical Provider, MD  fexofenadine (ALLEGRA) 180 MG tablet Take 180 mg by mouth daily as needed.    Historical Provider, MD  meclizine (ANTIVERT) 25 MG tablet  12/04/12   Historical Provider, MD  metoprolol succinate (TOPROL-XL) 100 MG 24 hr tablet Take 100 mg by mouth daily. Take with or immediately following a meal.    Historical Provider, MD  Multiple Vitamin (MULTIVITAMIN) tablet Take 1 tablet by mouth daily.    Historical Provider, MD  olmesartan (BENICAR) 20 MG tablet Take 20 mg by mouth daily.    Historical Provider, MD  rivaroxaban (XARELTO) 20 MG TABS tablet Take 1 tablet (20 mg total) by mouth daily with supper. 01/14/14   Mihai Croitoru, MD  Vitamin D, Ergocalciferol, (DRISDOL) 50000 UNITS CAPS Take 50,000 Units by mouth every 7 (seven) days. Fridays  or Saturdays    Historical Provider, MD   BP 144/91  Pulse 75  Temp(Src) 98 F (36.7 C) (Oral)  Resp 18  SpO2 99%  Physical Exam  Nursing note and vitals reviewed. Constitutional: She is oriented to person, place, and time. She appears well-developed and well-nourished. No distress.  HENT:  Head: Normocephalic and atraumatic.  Mouth/Throat: Oropharynx is clear and moist.  Eyes: Conjunctivae and EOM are normal. Pupils are equal, round, and reactive to light.  Neck: Normal range of motion. Neck supple.  Cardiovascular: Normal rate, regular rhythm and normal heart  sounds.   Pulmonary/Chest: Effort normal and breath sounds normal. No respiratory distress. She has no wheezes.  Abdominal: Soft. Bowel sounds are normal. There is no tenderness. There is no guarding.  Musculoskeletal: Normal range of motion.  Neurological: She is alert and oriented to person, place, and time.  Skin: Skin is warm and dry. She is not diaphoretic.  Psychiatric: Her mood appears anxious.  Anxious appearing    ED Course  Procedures (including critical care time) Labs Review Labs Reviewed  COMPREHENSIVE METABOLIC PANEL - Abnormal; Notable for the following:    Glucose, Bld 113 (*)    AST 173 (*)    ALT 179 (*)    Alkaline Phosphatase 157 (*)    Total Bilirubin 1.3 (*)    GFR calc non Af Amer 88 (*)    All other components within normal limits  CBC WITH DIFFERENTIAL  LIPASE, BLOOD  I-STAT TROPOININ, ED  Randolm Idol, ED    Imaging Review Dg Chest 2 View  01/15/2014   CLINICAL DATA:  Mid chest pain .  EXAM: CHEST  2 VIEW  COMPARISON:  11/05/2007.  FINDINGS: The heart size and mediastinal contours are within normal limits. Both lungs are clear. The visualized skeletal structures are unremarkable.  IMPRESSION: No active cardiopulmonary disease.   Electronically Signed   By: Marcello Moores  Register   On: 01/15/2014 13:19     EKG Interpretation   Date/Time:  Friday January 15 2014 11:44:52 EDT Ventricular Rate:  73 PR Interval:  148 QRS Duration: 88 QT Interval:  392 QTC Calculation: 431 R Axis:   50 Text Interpretation:  Normal sinus rhythm Normal ECG No significant change  since last tracing Confirmed by YAO  MD, DAVID ([id number]) on 01/15/2014  11:56:01 AM      MDM   Final diagnoses:  Chest pain, unspecified chest pain type   63 year old female with recently diagnosed atrial fibrillation, presenting to the need for episode of chest pain. States that there was localized and midsternal region, lasting a few minutes before resolving completely. On arrival to the  ED, patient is currently pain free.  She is currently on daily xarelto for anticoagulation, PE unlikely.  EKG sinus rhythm, no acute ischemic changes. Troponin negative. Lab work is reassuring-- patient does have elevated LFT's which she states is not new.  Given cardiac hx, will obtain delta trop.  Delta trop negative.  Patient remains CP free, VS stable on RA.  Low suspicion for ACS, PE, dissection, or other acute cardiac event at this time.  Feel patient safe for discharge home.  She will FU with cardiology next week as previously scheduled.  Discussed plan with patient, he/she acknowledged understanding and agreed with plan of care.  Return precautions given for new or worsening symptoms.  Case discussed with attending physician, Dr. Darl Householder, who agrees with assessment and plan of care.  Larene Pickett, PA-C 01/15/14 1600

## 2014-01-15 NOTE — ED Notes (Signed)
New dx of AFIB on 10/12 had cp this am  No sob, but  had pain in her neck

## 2014-01-15 NOTE — Telephone Encounter (Signed)
01-15-14  Called patient to inform her of her appointment with Dr. Sallyanne Kuster on Wednesday, 01-20-14 at 10:30.

## 2014-01-15 NOTE — ED Provider Notes (Signed)
Medical screening examination/treatment/procedure(s) were performed by non-physician practitioner and as supervising physician I was immediately available for consultation/collaboration.   EKG Interpretation   Date/Time:  Friday January 15 2014 11:44:52 EDT Ventricular Rate:  73 PR Interval:  148 QRS Duration: 88 QT Interval:  392 QTC Calculation: 431 R Axis:   50 Text Interpretation:  Normal sinus rhythm Normal ECG No significant change  since last tracing Confirmed by Phyllicia Dudek  MD, Kalem Rockwell ([id number]) on 01/15/2014  11:56:01 AM        Wandra Arthurs, MD 01/15/14 1911

## 2014-01-20 ENCOUNTER — Encounter: Payer: Self-pay | Admitting: Cardiovascular Disease

## 2014-01-20 ENCOUNTER — Ambulatory Visit (INDEPENDENT_AMBULATORY_CARE_PROVIDER_SITE_OTHER): Payer: 59 | Admitting: Cardiovascular Disease

## 2014-01-20 VITALS — BP 148/86 | HR 65 | Resp 16 | Ht 68.0 in | Wt 178.7 lb

## 2014-01-20 DIAGNOSIS — K7460 Unspecified cirrhosis of liver: Secondary | ICD-10-CM

## 2014-01-20 DIAGNOSIS — I480 Paroxysmal atrial fibrillation: Secondary | ICD-10-CM

## 2014-01-20 DIAGNOSIS — I4891 Unspecified atrial fibrillation: Secondary | ICD-10-CM

## 2014-01-20 DIAGNOSIS — I10 Essential (primary) hypertension: Secondary | ICD-10-CM

## 2014-01-20 DIAGNOSIS — K7581 Nonalcoholic steatohepatitis (NASH): Secondary | ICD-10-CM

## 2014-01-20 DIAGNOSIS — I471 Supraventricular tachycardia: Secondary | ICD-10-CM

## 2014-01-20 MED ORDER — METOPROLOL SUCCINATE ER 50 MG PO TB24: ORAL_TABLET | ORAL | Status: DC

## 2014-01-20 MED ORDER — ASPIRIN EC 81 MG PO TBEC: 81.0000 mg | DELAYED_RELEASE_TABLET | Freq: Every day | ORAL | Status: DC

## 2014-01-20 NOTE — Patient Instructions (Signed)
INCREASE METOPROLOL TO 150MG  DAILY (A NEW RX HAS BEEN SENT TO YOUR PHARMACY FOR THE 50MG  TABLETS).  YOU CAN TAKE AN EXTRA 50MG  WITH INCREASED PALPITATIONS.  STOP XARELTO.  START ASPIRIN 81MG  DAILY.  Dr. Sallyanne Kuster recommends that you schedule a follow-up appointment in: 3 MONTHS.

## 2014-01-21 ENCOUNTER — Encounter: Payer: Self-pay | Admitting: Cardiovascular Disease

## 2014-01-21 DIAGNOSIS — K7581 Nonalcoholic steatohepatitis (NASH): Secondary | ICD-10-CM

## 2014-01-21 DIAGNOSIS — K7460 Unspecified cirrhosis of liver: Secondary | ICD-10-CM | POA: Insufficient documentation

## 2014-01-21 DIAGNOSIS — I480 Paroxysmal atrial fibrillation: Secondary | ICD-10-CM | POA: Insufficient documentation

## 2014-01-21 NOTE — Progress Notes (Signed)
Patient ID: Amanda Frost, female   DOB: 1950/04/06, 63 y.o.   MRN: [id number]     Reason for office visit Recent atrial fibrillation, newly diagnosed  Amanda Frost had paroxysmal atrial fibrillation with RVR during a trip to [facility] and was briefly hospitalized. Xarelto was started and she is still on a beta blocker. The whole episode lasted just over 24 hours (onset around 2AM).   She had normal coronary angio in 2009, normal stress test in Sep 2014 and had an echo at [facility] on January 14, 2014. Both atria are normal in size, the LVEF is normal, no valvular problems.  She does not have DM, CHF, history of stroke/TIA and is young. In the past she has had brief paroxysmal atrial tachycardia, but this is the first AF episode. She has NASH with liver cirrhosis, well compensated.  Allergies  Allergen Reactions  . Sulfa Antibiotics Other (See Comments)    Unsure, childhood reaction    Current Outpatient Prescriptions  Medication Sig Dispense Refill  . metoprolol succinate (TOPROL-XL) 50 MG 24 hr tablet Take with or immediately following a meal.  You can take an extra 50mg  with increased palpitations.  90 tablet  6  . valsartan (DIOVAN) 160 MG tablet Take 160 mg by mouth daily.      . Vitamin D, Ergocalciferol, (DRISDOL) 50000 UNITS CAPS Take 50,000 Units by mouth every 7 (seven) days. Fridays or Saturdays      . aspirin EC 81 MG tablet Take 1 tablet (81 mg total) by mouth daily.  90 tablet  3   No current facility-administered medications for this visit.    Past Medical History  Diagnosis Date  . Ocular migraine   . Hypertension   . Palpitations     PAC's,PVC's,atrial tachycardia  . A-fib   . Liver cirrhosis secondary to NASH (nonalcoholic steatohepatitis)     Past Surgical History  Procedure Laterality Date  . Breast biopsy  1994  . Cardiac catheterization  11/05/2007    normal LV fx,patent coronary arteries  . US echocardiography  11/26/2007    mild  LVH,trace MR,TR  . Nm myoview ltd  03/12/2005    normal    Family History  Problem Relation Age of Onset  . Diabetes Mother   . Hypertension Mother   . Kidney disease Mother   . Diabetes Father   . Hypertension Father     History   Social History  . Marital Status: Married    Spouse Name: N/A    Number of Children: N/A  . Years of Education: N/A   Occupational History  . Not on file.   Social History Main Topics  . Smoking status: Never Smoker   . Smokeless tobacco: Not on file  . Alcohol Use: No  . Drug Use: No  . Sexual Activity: Not on file   Other Topics Concern  . Not on file   Social History Narrative  . No narrative on file    Review of systems: The patient specifically denies any chest pain at rest or with exertion, dyspnea at rest or with exertion, orthopnea, paroxysmal nocturnal dyspnea, syncope, palpitations, focal neurological deficits, intermittent claudication, lower extremity edema, unexplained weight gain, cough, hemoptysis or wheezing.  The patient also denies abdominal pain, nausea, vomiting, dysphagia, diarrhea, constipation, polyuria, polydipsia, dysuria, hematuria, frequency, urgency, abnormal bleeding or bruising, fever, chills, unexpected weight changes, mood swings, change in skin or hair texture, change in voice quality, auditory or visual problems, allergic  reactions or rashes, new musculoskeletal complaints other than usual "aches and pains".   PHYSICAL EXAM BP 148/86  Pulse 65  Resp 16  Ht 5\' 8"  (1.727 m)  Wt 178 lb 11.2 oz (81.058 kg)  BMI 27.18 kg/m2  General: Alert, oriented x3, no distress Head: no evidence of trauma, PERRL, EOMI, no exophtalmos or lid lag, no myxedema, no xanthelasma; normal ears, nose and oropharynx Neck: normal jugular venous pulsations and no hepatojugular reflux; brisk carotid pulses without delay and no carotid bruits Chest: clear to auscultation, no signs of consolidation by percussion or palpation, normal  fremitus, symmetrical and full respiratory excursions Cardiovascular: normal position and quality of the apical impulse, regular rhythm, normal first and second heart sounds, no murmurs, rubs or gallops Abdomen: no tenderness or distention, no masses by palpation, no abnormal pulsatility or arterial bruits, normal bowel sounds, no hepatosplenomegaly Extremities: no clubbing, cyanosis or edema; 2+ radial, ulnar and brachial pulses bilaterally; 2+ right femoral, posterior tibial and dorsalis pedis pulses; 2+ left femoral, posterior tibial and dorsalis pedis pulses; no subclavian or femoral bruits Neurological: grossly nonfocal   EKG: NSR  Lipid Panel     Component Value Date/Time   CHOL  Value: 136        ATP III CLASSIFICATION:  <200     mg/dL   Desirable  200-239  mg/dL   Borderline High  >=240    mg/dL   High 11/05/2007 0821   TRIG 145 11/05/2007 0821   HDL 36* 11/05/2007 0821   CHOLHDL 3.8 11/05/2007 0821   VLDL 29 11/05/2007 0821   LDLCALC  Value: 71        Total Cholesterol/HDL:CHD Risk Coronary Heart Disease Risk Table                     Men   Women  1/2 Average Risk   3.4   3.3 11/05/2007 0821    BMET    Component Value Date/Time   NA 139 01/15/2014 1215   K 4.4 01/15/2014 1215   CL 102 01/15/2014 1215   CO2 23 01/15/2014 1215   GLUCOSE 113* 01/15/2014 1215   BUN 10 01/15/2014 1215   CREATININE 0.77 01/15/2014 1215   CALCIUM 9.9 01/15/2014 1215   GFRNONAA 88* 01/15/2014 1215   GFRAA >90 01/15/2014 1215     ASSESSMENT AND PLAN  Orders Placed This Encounter  Procedures  . EKG 12-Lead   Meds ordered this encounter  Medications  . metoprolol succinate (TOPROL-XL) 50 MG 24 hr tablet    Sig: Take with or immediately following a meal.  You can take an extra 50mg  with increased palpitations.    Dispense:  90 tablet    Refill:  6  . aspirin EC 81 MG tablet    Sig: Take 1 tablet (81 mg total) by mouth daily.    Dispense:  90 tablet    Refill:  [place]  Dela Sweeny, MD, [facility] [phone] office [phone] pager

## 2014-01-21 NOTE — Assessment & Plan Note (Signed)
A little high, may reach normal range with increase in beta blocker

## 2014-01-21 NOTE — Assessment & Plan Note (Signed)
She had RVR on the current metoprolol dose. Increase to 150 mg daily. Consider flecainide or Multaq if the arrhythmia is frequent and symptomatic. Better yet, she may be an excellent candidate for RF ablation with no structural heart disease, normal LA size and history of PAT and PAF. CHADSVasc score is low (2 - female gender and HTN) and HAS-BLED score is elevated due to liver disease. The risk of major bleed with Xarelto (3.1%) is higher than the stroke risk while on aspirin (2.3%). I think she should stop anticoagulation and take ASA.

## 2014-02-19 ENCOUNTER — Encounter: Payer: Self-pay | Admitting: Cardiovascular Disease

## 2014-02-24 ENCOUNTER — Telehealth: Payer: Self-pay | Admitting: Cardiovascular Disease

## 2014-02-24 MED ORDER — METOPROLOL SUCCINATE ER 50 MG PO TB24: 150.0000 mg | ORAL_TABLET | Freq: Every day | ORAL | Status: DC

## 2014-02-24 NOTE — Telephone Encounter (Signed)
Spoke with pt and pharm, aware script is correct at the [facility]

## 2014-02-24 NOTE — Telephone Encounter (Signed)
Please call her,concerning the quainity of her Metoprolol.

## 2014-02-24 NOTE — Telephone Encounter (Signed)
They need a new prescription for her Metoprolol.Thie new prescription will need to reflect new directions and new quainity if this is what is needed.

## 2014-03-01 ENCOUNTER — Encounter: Payer: Self-pay | Admitting: Cardiovascular Disease

## 2014-03-05 ENCOUNTER — Ambulatory Visit: Payer: 59 | Admitting: Cardiovascular Disease

## 2014-04-29 ENCOUNTER — Ambulatory Visit (INDEPENDENT_AMBULATORY_CARE_PROVIDER_SITE_OTHER): Payer: 59 | Admitting: Cardiovascular Disease

## 2014-04-29 ENCOUNTER — Encounter: Payer: Self-pay | Admitting: Cardiovascular Disease

## 2014-04-29 VITALS — BP 148/82 | HR 70 | Resp 16 | Ht 68.0 in | Wt 182.6 lb

## 2014-04-29 DIAGNOSIS — I471 Supraventricular tachycardia: Secondary | ICD-10-CM

## 2014-04-29 DIAGNOSIS — K7460 Unspecified cirrhosis of liver: Secondary | ICD-10-CM

## 2014-04-29 DIAGNOSIS — I480 Paroxysmal atrial fibrillation: Secondary | ICD-10-CM

## 2014-04-29 DIAGNOSIS — K7581 Nonalcoholic steatohepatitis (NASH): Secondary | ICD-10-CM

## 2014-04-29 DIAGNOSIS — I10 Essential (primary) hypertension: Secondary | ICD-10-CM

## 2014-04-29 NOTE — Patient Instructions (Signed)
You have been referred to Dr. Thompson Grayer to discuss radiofrequency ablation for Atrial Fibrillation.   Dr. Sallyanne Kuster recommends that you schedule a follow-up appointment in: 6 months

## 2014-04-30 ENCOUNTER — Encounter: Payer: Self-pay | Admitting: Cardiovascular Disease

## 2014-04-30 NOTE — Progress Notes (Signed)
Patient ID: Amanda Frost, female   DOB: 02-07-1951, 64 y.o.   MRN: [id number]     Reason for office visit Paroxysmal atrial fibrillation, paroxysmal atrial tachycardia  Amanda Frost returns in follow-up for arrhythmia. She has had years of brief episodes of paroxysmal atrial tachycardia and has had a single 24 hour episode of atrial fibrillation that occurred months ago while she was on a trip to [facility]. She does not have a history of stroke or TIA. Her risk factors for embolic events are limited to hypertension and female gender. By echocardiography, her left atrium is normal in size. Since she has a history of liver cirrhosis, I have been ambivalent about prescribing full anticoagulation since the risk of bleeding seems to statistically exceed the benefit and stroke reduction. She is taking aspirin without problems.  The episodes of palpitations have definitely increased in prevalence compared with a year ago. On the average to occur 3-4 times a day. They usually last for 2-3 hours, described as a "rumbling" sensation in her chest. Interestingly she feels is differently than her previous episodes of tachycardia, which she is described as "butterflies in her chest". Other than palpitations she denies any cardiovascular complaints. She has not needed to take any additional "as needed" doses of beta blocker for the palpitations.  Her blood pressure slightly elevated today, but atypical blood pressure recording at home is around 120/70 mmHg.   Allergies  Allergen Reactions  . Sulfa Antibiotics Other (See Comments)    Unsure, childhood reaction    Current Outpatient Prescriptions  Medication Sig Dispense Refill  . aspirin EC 81 MG tablet Take 1 tablet (81 mg total) by mouth daily. 90 tablet 3  . metoprolol succinate (TOPROL-XL) 50 MG 24 hr tablet Take 3 tablets (150 mg total) by mouth daily. Take with or immediately following a meal.  You can take an extra 50mg  with increased palpitations.  90 tablet 6  . valsartan (DIOVAN) 160 MG tablet Take 160 mg by mouth daily.    . Vitamin D, Ergocalciferol, (DRISDOL) 50000 UNITS CAPS Take 50,000 Units by mouth every 7 (seven) days. Fridays or Saturdays     No current facility-administered medications for this visit.    Past Medical History  Diagnosis Date  . Ocular migraine   . Hypertension   . Palpitations     PAC's,PVC's,atrial tachycardia  . A-fib   . Liver cirrhosis secondary to NASH (nonalcoholic steatohepatitis)     Past Surgical History  Procedure Laterality Date  . Breast biopsy  1994  . Cardiac catheterization  11/05/2007    normal LV fx,patent coronary arteries  . US echocardiography  11/26/2007    mild LVH,trace MR,TR  . Nm myoview ltd  03/12/2005    normal    Family History  Problem Relation Age of Onset  . Diabetes Mother   . Hypertension Mother   . Kidney disease Mother   . Diabetes Father   . Hypertension Father     History   Social History  . Marital Status: Married    Spouse Name: N/A    Number of Children: N/A  . Years of Education: N/A   Occupational History  . Not on file.   Social History Main Topics  . Smoking status: Never Smoker   . Smokeless tobacco: Not on file  . Alcohol Use: No  . Drug Use: No  . Sexual Activity: Not on file   Other Topics Concern  . Not on file   Social History  Narrative    Review of systems: The patient specifically denies any chest pain at rest or with exertion, dyspnea at rest or with exertion, orthopnea, paroxysmal nocturnal dyspnea, syncope, palpitations, focal neurological deficits, intermittent claudication, lower extremity edema, unexplained weight gain, cough, hemoptysis or wheezing.  The patient also denies abdominal pain, nausea, vomiting, dysphagia, diarrhea, constipation, polyuria, polydipsia, dysuria, hematuria, frequency, urgency, abnormal bleeding or bruising, fever, chills, unexpected weight changes, mood swings, change in skin or hair  texture, change in voice quality, auditory or visual problems, allergic reactions or rashes, new musculoskeletal complaints other than usual "aches and pains".   PHYSICAL EXAM BP 148/82 mmHg  Pulse 70  Resp 16  Ht 5\' 8"  (1.727 m)  Wt 182 lb 9.6 oz (82.827 kg)  BMI 27.77 kg/m2  General: Alert, oriented x3, no distress Head: no evidence of trauma, PERRL, EOMI, no exophtalmos or lid lag, no myxedema, no xanthelasma; normal ears, nose and oropharynx Neck: normal jugular venous pulsations and no hepatojugular reflux; brisk carotid pulses without delay and no carotid bruits Chest: clear to auscultation, no signs of consolidation by percussion or palpation, normal fremitus, symmetrical and full respiratory excursions Cardiovascular: normal position and quality of the apical impulse, regular rhythm, normal first and second heart sounds, no murmurs, rubs or gallops Abdomen: no tenderness or distention, no masses by palpation, no abnormal pulsatility or arterial bruits, normal bowel sounds, no hepatosplenomegaly Extremities: no clubbing, cyanosis or edema; 2+ radial, ulnar and brachial pulses bilaterally; 2+ right femoral, posterior tibial and dorsalis pedis pulses; 2+ left femoral, posterior tibial and dorsalis pedis pulses; no subclavian or femoral bruits Neurological: grossly nonfocal  Lipid Panel     Component Value Date/Time   CHOL  11/05/2007 0821    136        ATP III CLASSIFICATION:  <200     mg/dL   Desirable  200-239  mg/dL   Borderline High  >=240    mg/dL   High   TRIG 145 11/05/2007 0821   HDL 36* 11/05/2007 0821   CHOLHDL 3.8 11/05/2007 0821   VLDL 29 11/05/2007 0821   LDLCALC  11/05/2007 0821    71        Total Cholesterol/HDL:CHD Risk Coronary Heart Disease Risk Table                     Men   Women  1/2 Average Risk   3.4   3.3    BMET    Component Value Date/Time   NA 139 01/15/2014 1215   K 4.4 01/15/2014 1215   CL 102 01/15/2014 1215   CO2 23 01/15/2014 1215     GLUCOSE 113* 01/15/2014 1215   BUN 10 01/15/2014 1215   CREATININE 0.77 01/15/2014 1215   CALCIUM 9.9 01/15/2014 1215   GFRNONAA 88* 01/15/2014 1215   GFRAA >90 01/15/2014 1215     ASSESSMENT AND PLAN  Amanda Frost symptoms are becoming much more frequent, although there are not particularly severe. I do think we may have to proceed to a more aggressive rhythm control strategy and would like to seek advice regarding the benefits of radiofrequency ablation. I will refer her to Dr. Thompson Grayer. 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 Episodes of PAT have proceeded the atrial fibrillation for years. She had normal coronary arteries by angiography in 2009 and a normal stress test in 2014. Antiarrhythmics are obviously another option.  I would also appreciate his  insight into the risk/benefit ratio of anticoagulation in this relatively young woman with a small left atrium and well compensated NASH-associated liver cirrhosis. CHADSVasc2 is elevated at 2, but I believe her personal embolic risk is relatively low.  Orders Placed This Encounter  Procedures  . Ambulatory referral to Cardiac Electrophysiology   No orders of the defined types were placed in this encounter.    Holli Humbles, MD, [place] [phone] office [phone] pager

## 2014-05-05 ENCOUNTER — Ambulatory Visit (INDEPENDENT_AMBULATORY_CARE_PROVIDER_SITE_OTHER): Payer: 59 | Admitting: Internal Medicine

## 2014-05-05 ENCOUNTER — Encounter: Payer: Self-pay | Admitting: Internal Medicine

## 2014-05-05 VITALS — BP 128/84 | HR 62 | Ht 68.0 in | Wt 180.0 lb

## 2014-05-05 DIAGNOSIS — I10 Essential (primary) hypertension: Secondary | ICD-10-CM

## 2014-05-05 DIAGNOSIS — I471 Supraventricular tachycardia: Secondary | ICD-10-CM

## 2014-05-05 DIAGNOSIS — I480 Paroxysmal atrial fibrillation: Secondary | ICD-10-CM

## 2014-05-05 NOTE — Progress Notes (Signed)
Electrophysiology Office Note   Date:  05/05/2014   ID:  Amanda Frost, DOB [birth year], MRN [id number]  PCP:  Jerlyn Ly, MD  Cardiologist:  Dr Sallyanne Kuster Primary Electrophysiologist: Thompson Grayer, MD    Chief Complaint  Patient presents with  . Atrial Fibrillation     History of Present Illness: Amanda Frost is a 64 y.o. female who presents today for electrophysiology evaluation.   She reports having brief palpitations for "years".  She describes these as last only several seconds but recurring in waves.  She has been found to have pacs, pvcs and atrial tachycardia as the cause.  More recently, she had an episode of sustained irregular tachypalpitations 10/11/15s on vacation and presented to [facility] in [place].  She was diagnosed with atrial fibrillation at that time and initiated on xarelto.  Her metoprolol was increased from 100mg  daily to 150mg  daily.  She has had brief (only lasting seconds) episodes of palpitations occuring throughout the day.  Occasionally this induces a cough response.  She has not had any further sustained atrial fibrillation.  She has NASH and therefore medical therapy has been limited. 1 Today, she denies symptoms of chest pain, shortness of breath, orthopnea, PND, lower extremity edema, claudication, dizziness, presyncope, syncope, bleeding, or neurologic sequela. The patient is tolerating medications without difficulties and is otherwise without complaint today.    Past Medical History  Diagnosis Date  . Ocular migraine   . Hypertension   . Palpitations     PAC's,PVC's,atrial tachycardia  . Paroxysmal atrial fibrillation   . Liver cirrhosis secondary to NASH (nonalcoholic steatohepatitis)    Past Surgical History  Procedure Laterality Date  . Breast biopsy  1994  . Cardiac catheterization  11/05/2007    normal LV fx,patent coronary arteries  . US echocardiography  11/26/2007    mild LVH,trace MR,TR  . Nm myoview ltd   03/12/2005    normal     Current Outpatient Prescriptions  Medication Sig Dispense Refill  . adapalene (DIFFERIN) 0.1 % cream Apply 1 application topically at bedtime as needed (face).    Marland Kitchen aspirin EC 81 MG tablet Take 1 tablet (81 mg total) by mouth daily. 90 tablet 3  . metoprolol succinate (TOPROL-XL) 50 MG 24 hr tablet Take 3 tablets (150 mg total) by mouth daily. Take with or immediately following a meal.  You can take an extra 50mg  with increased palpitations. 90 tablet 6  . valsartan (DIOVAN) 160 MG tablet Take 160 mg by mouth daily.    . Vitamin D, Ergocalciferol, (DRISDOL) 50000 UNITS CAPS Take 50,000 Units by mouth every 7 (seven) days. Fridays or Saturdays    . tretinoin (RETIN-A) 0.025 % cream Apply 1 application topically at bedtime.     No current facility-administered medications for this visit.    Allergies:   Sulfa antibiotics   Social History:  The patient  reports that she has never smoked. She does not have any smokeless tobacco history on file. She reports that she does not drink alcohol or use illicit drugs.   Family History:  The patient's  family history includes Diabetes in her father and mother; Hypertension in her father and mother; Kidney disease in her mother.    ROS:  Please see the history of present illness.   All other systems are reviewed and negative.    PHYSICAL EXAM: VS:  BP 128/84 mmHg  Pulse 62  Ht 5\' 8"  (1.727 m)  Wt 180 lb (81.647 kg)  BMI 27.38 kg/m2 , BMI Body mass index is 27.38 kg/(m^2). GEN: Well nourished, well developed, in no acute distress HEENT: normal Neck: no JVD, carotid bruits, or masses Cardiac: RRR; no murmurs, rubs, or gallops,no edema  Respiratory:  clear to auscultation bilaterally, normal work of breathing GI: soft, nontender, nondistended, + BS MS: no deformity or atrophy Skin: warm and dry  Neuro:  Strength and sensation are intact Psych: euthymic mood, full affect  EKG:  EKG is ordered today. The ekg ordered  today shows sinus rhythm 62 bpm, otherwise normal ekg   Recent Labs: 01/15/2014: ALT 179*; BUN 10; Creatinine 0.77; Hemoglobin 13.8; Platelets 252; Potassium 4.4; Sodium 139    Lipid Panel     Component Value Date/Time   CHOL  11/05/2007 0821    136        ATP III CLASSIFICATION:  <200     mg/dL   Desirable  200-239  mg/dL   Borderline High  >=240    mg/dL   High   TRIG 145 11/05/2007 0821   HDL 36* 11/05/2007 0821   CHOLHDL 3.8 11/05/2007 0821   VLDL 29 11/05/2007 0821   LDLCALC  11/05/2007 0821    71        Total Cholesterol/HDL:CHD Risk Coronary Heart Disease Risk Table                     Men   Women  1/2 Average Risk   3.4   3.3     Wt Readings from Last 3 Encounters:  05/05/14 180 lb (81.647 kg)  04/29/14 182 lb 9.6 oz (82.827 kg)  01/20/14 178 lb 11.2 oz (81.058 kg)      Other studies Reviewed: Additional studies/ records that were reviewed today include: echo 01/10/14 and also Dr Croitoru's notes  Review of the above records today demonstrates: preserved EF without significant valvular disease, LA size 39 mm   ASSESSMENT AND PLAN:  1.  Paroxysmal atrial fibrillation/ PACs She has had documented afib.  She also has frequent PACs and a h/o atach.  She has failed medical therapy with metoprolol.  She has not tried AAD therapy. Her chads2vasc score is 2.  She is not presently on anticoagulation as per Dr Victorino December notes.  Given AVERROES data, I think that anticoagulation with eliquis would be very reasonable.  She will discuss this further with Dr Sallyanne Kuster on follow-up. Therapeutic strategies for afib including medicine and ablation were discussed in detail with the patient today. Risk, benefits, and alternatives to EP study and radiofrequency ablation for afib were also discussed in detail today.   At this time, she would like to avoid ablation.  She would like to continue her current strategy for now. I think that should her AF burden increase, the best approach  for her would be to initiate flecainide.  As flecainide is metabolized by the liver, I would advise 50mg  BID and then to obtain a peak level after 5 doses.  I also think that diltiazem would be a reasonable option.  Again, given liver metabolism, I would suggest low dosing to begin with.  2. HTN Stable No change required today   Current medicines are reviewed at length with the patient today.   The patient does not have concerns regarding her medicines.  The following changes were made today:  none   Follow-up:  She will follow-up with Dr Sallyanne Kuster as scheduled.  I will see as needed going forward.  She will contact  my office should she decide to consider ablation in the future.   SignedThompson Grayer, MD  05/05/2014 3:35 PM     [address] [place] [phone] (office) [phone] (fax)

## 2014-05-05 NOTE — Patient Instructions (Signed)
Your physician recommends that you schedule a follow-up appointment as needed with Dr Allred   

## 2014-05-10 ENCOUNTER — Telehealth: Payer: Self-pay | Admitting: Cardiovascular Disease

## 2014-05-10 ENCOUNTER — Telehealth: Payer: Self-pay | Admitting: *Deleted

## 2014-05-10 MED ORDER — APIXABAN 5 MG PO TABS: 5.0000 mg | ORAL_TABLET | Freq: Two times a day (BID) | ORAL | Status: DC

## 2014-05-10 NOTE — Telephone Encounter (Signed)
Patient notified to start Eliquis 5mg  bid and stop Aspirin.  Rx sent to pharmacy.  Patient voiced understanding.

## 2014-05-10 NOTE — Telephone Encounter (Signed)
Pt called in stating that she just picked up her prescription for Eliquis and she noticed that the instructions on the paper said that if the pt had a liver disease to consult the physician before taking it. She was concerned because of her non-alcoholic fatty liver disease. Please f/u with pt  Thanks

## 2014-05-10 NOTE — Telephone Encounter (Signed)
Please advise 

## 2014-05-10 NOTE — Telephone Encounter (Signed)
-----   Message from Sanda Klein, MD sent at 05/07/2014 10:32 AM EST ----- Thank you, Jeneen Rinks. Pamala Hurry, please ask her to start Eliquis 5 mg BID and stop aspirin MCr ----- Message -----    From: Thompson Grayer, MD    Sent: 05/06/2014  10:32 PM      To: Sanda Klein, MD

## 2014-05-11 NOTE — Telephone Encounter (Signed)
Explained Dr. Lurline Del recommendation to patient, she voiced understanding.  She also had questions related to metoprolol and PRN OTC meds. These were answered as well.

## 2014-05-11 NOTE — Telephone Encounter (Signed)
Pt says she is stilling waiting to hear something from her call from yesterday.Since she is not having hardly any symptoms of atrial fib,she was wondering why Dr C had put her on a blood thinner.

## 2014-05-11 NOTE — Telephone Encounter (Signed)
Unfortunately, the presence or absence of symptoms of atrial fibrillation does not have any bearing on the risk of a stroke with atrial fibrillation. I was also hesitant to start an anticoagulant due to her history of liver disease (all the anticoagulants carry the same risk as far as liver disease), but our arrhythmia specialist (who saw her in consultation) recommended anticoagulation.

## 2014-06-30 ENCOUNTER — Encounter: Payer: Self-pay | Admitting: Cardiovascular Disease

## 2014-06-30 ENCOUNTER — Ambulatory Visit (INDEPENDENT_AMBULATORY_CARE_PROVIDER_SITE_OTHER): Payer: 59 | Admitting: Cardiovascular Disease

## 2014-06-30 VITALS — BP 152/86 | HR 61 | Resp 16 | Ht 68.0 in | Wt 172.4 lb

## 2014-06-30 DIAGNOSIS — I10 Essential (primary) hypertension: Secondary | ICD-10-CM | POA: Diagnosis not present

## 2014-06-30 DIAGNOSIS — I480 Paroxysmal atrial fibrillation: Secondary | ICD-10-CM

## 2014-06-30 DIAGNOSIS — K7581 Nonalcoholic steatohepatitis (NASH): Secondary | ICD-10-CM | POA: Diagnosis not present

## 2014-06-30 DIAGNOSIS — K7460 Unspecified cirrhosis of liver: Secondary | ICD-10-CM

## 2014-06-30 NOTE — Progress Notes (Signed)
Patient ID: Amanda Frost, female   DOB: 17-Jul-1950, 64 y.o.   MRN: [id number]     Cardiology Office Note   Date:  07/03/2014   ID:  Amanda Frost, DOB 12-07-1950, MRN [id number]  PCP:  Jerlyn Ly, MD  Cardiologist:   Sanda Klein, MD   Chief Complaint  Patient presents with  . Follow-up    last OV Jan 2016, was having palps and fluttering but has since seen Dr.Allred.       History of Present Illness: Amanda Frost is a 64 y.o. female who presents for follow up of paroxysmal atrial fibrillation, paroxysmal trial tachycardia and HTN. She is tolerating Eliquis without bleeding complications. No history of CVA/TIA. She has occasional palpitations, but they are short lived. BP is uncharacteristically high today. She discussed ablation, flecainide and diltiazem with Dr. Rayann Heman. So far she prefers the most conservative approach.   Past Medical History  Diagnosis Date  . Ocular migraine   . Hypertension   . Palpitations     PAC's,PVC's,atrial tachycardia  . Paroxysmal atrial fibrillation   . Liver cirrhosis secondary to NASH (nonalcoholic steatohepatitis)     Past Surgical History  Procedure Laterality Date  . Breast biopsy  1994  . Cardiac catheterization  11/05/2007    normal LV fx,patent coronary arteries  . US echocardiography  11/26/2007    mild LVH,trace MR,TR  . Nm myoview ltd  03/12/2005    normal     Current Outpatient Prescriptions  Medication Sig Dispense Refill  . adapalene (DIFFERIN) 0.1 % cream Apply 1 application topically at bedtime as needed (face).    Marland Kitchen apixaban (ELIQUIS) 5 MG TABS tablet Take 1 tablet (5 mg total) by mouth 2 (two) times daily. 60 tablet 6  . metoprolol succinate (TOPROL-XL) 50 MG 24 hr tablet Take 3 tablets (150 mg total) by mouth daily. Take with or immediately following a meal.  You can take an extra 50mg  with increased palpitations. 90 tablet 6  . valsartan (DIOVAN) 160 MG tablet Take 160 mg by mouth daily.    .  Vitamin D, Ergocalciferol, (DRISDOL) 50000 UNITS CAPS Take 50,000 Units by mouth every 7 (seven) days. Fridays or Saturdays    . LORazepam (ATIVAN) 0.5 MG tablet Take 0.5 mg by mouth as needed.  0   No current facility-administered medications for this visit.    Allergies:   Sulfa antibiotics    Social History:  The patient  reports that she has never smoked. She does not have any smokeless tobacco history on file. She reports that she does not drink alcohol or use illicit drugs.   Family History:  The patient's family history includes Diabetes in her father and mother; Hypertension in her father and mother; Kidney disease in her mother.    ROS:  Please see the history of present illness.    Otherwise, review of systems positive for none.   All other systems are reviewed and negative.    PHYSICAL EXAM: VS:  BP 152/86 mmHg  Pulse 61  Resp 16  Ht 5\' 8"  (1.727 m)  Wt 172 lb 6.4 oz (78.2 kg)  BMI 26.22 kg/m2 , BMI Body mass index is 26.22 kg/(m^2).  General: Alert, oriented x3, no distress Head: no evidence of trauma, PERRL, EOMI, no exophtalmos or lid lag, no myxedema, no xanthelasma; normal ears, nose and oropharynx Neck: normal jugular venous pulsations and no hepatojugular reflux; brisk carotid pulses without delay and no carotid bruits Chest: clear to auscultation, no  signs of consolidation by percussion or palpation, normal fremitus, symmetrical and full respiratory excursions Cardiovascular: normal position and quality of the apical impulse, regular rhythm, normal first and second heart sounds, no murmurs, rubs or gallops Abdomen: no tenderness or distention, no masses by palpation, no abnormal pulsatility or arterial bruits, normal bowel sounds, no hepatosplenomegaly Extremities: no clubbing, cyanosis or edema; 2+ radial, ulnar and brachial pulses bilaterally; 2+ right femoral, posterior tibial and dorsalis pedis pulses; 2+ left femoral, posterior tibial and dorsalis pedis  pulses; no subclavian or femoral bruits Neurological: grossly nonfocal Psych: euthymic mood, full affect   EKG:  EKG is ordered today. The ekg ordered today demonstrates NSR, QTc 408 ms   Recent Labs: 01/15/2014: ALT 179*; BUN 10; Creatinine 0.77; Hemoglobin 13.8; Platelets 252; Potassium 4.4; Sodium 139    Lipid Panel    Component Value Date/Time   CHOL  11/05/2007 0821    136        ATP III CLASSIFICATION:  <200     mg/dL   Desirable  200-239  mg/dL   Borderline High  >=240    mg/dL   High   TRIG 145 11/05/2007 0821   HDL 36* 11/05/2007 0821   CHOLHDL 3.8 11/05/2007 0821   VLDL 29 11/05/2007 0821   LDLCALC  11/05/2007 0821    71        Total Cholesterol/HDL:CHD Risk Coronary Heart Disease Risk Table                     Men   Women  1/2 Average Risk   3.4   3.3      Wt Readings from Last 3 Encounters:  06/30/14 172 lb 6.4 oz (78.2 kg)  05/05/14 180 lb (81.647 kg)  04/29/14 182 lb 9.6 oz (82.827 kg)      Other studies Reviewed: Additional studies/ records that were reviewed today include: Dr. Jackalyn Lombard note. Review of the above records demonstrates: enhanced rate control with diltiazem, flecainide antiarrhythmic therapy or ablation all offered.   ASSESSMENT AND PLAN:  Consider flecainide if the arrhythmia is more frequent and symptomatic.  She may be an excellent candidate for RF ablation with no structural heart disease, normal LA size and history of PAT and PAF. Monitor BP - usually much lower at home. May need more meds for BP control  Current medicines are reviewed at length with the patient today.  The patient does not have concerns regarding medicines.  The following changes have been made:  no change  Labs/ tests ordered today include:  No orders of the defined types were placed in this encounter.   Patient Instructions  Dr. Sallyanne Kuster recommends that you schedule a follow-up appointment in: ONE YEAR       SignedSanda Klein, MD  07/03/2014  9:08 PM    Sanda Klein, MD, [facility] [phone] office [phone] pager

## 2014-06-30 NOTE — Patient Instructions (Signed)
Dr. Croitoru recommends that you schedule a follow-up appointment in: ONE YEAR   

## 2014-07-03 ENCOUNTER — Encounter: Payer: Self-pay | Admitting: Cardiovascular Disease

## 2014-07-22 ENCOUNTER — Telehealth: Payer: Self-pay | Admitting: Cardiovascular Disease

## 2014-07-22 NOTE — Telephone Encounter (Signed)
She does not need antibiotics and probably does not need to hold her anticoagulant (unless a tooth extraction or other procedure with higher bleeding risk is planned).

## 2014-07-22 NOTE — Telephone Encounter (Signed)
Returned call to patient no answer.Left message on personal voice mail will send message to Dr.Croitoru for advice.

## 2014-07-22 NOTE — Telephone Encounter (Signed)
Pt called in stating that she will be going to the dentist on Monday and she would like to know if there are any precautions she needs to take . Please f/u with pt  Thanks

## 2014-07-22 NOTE — Telephone Encounter (Signed)
Returned call to patient Dr.Croitoru advised does not need antibiotics,does not need to hold eliquis unless having a tooth extracted or having a procedure with bleeding.Stated she is having dental cleaning.

## 2014-08-29 ENCOUNTER — Emergency Department (HOSPITAL_COMMUNITY): Payer: 59

## 2014-08-29 ENCOUNTER — Encounter (HOSPITAL_COMMUNITY): Payer: Self-pay | Admitting: Family Medicine

## 2014-08-29 ENCOUNTER — Emergency Department (HOSPITAL_COMMUNITY)
Admission: EM | Admit: 2014-08-29 | Discharge: 2014-08-29 | Disposition: A | Discharge status: Home / Self Care | Payer: 59 | Attending: Emergency Medicine | Admitting: Emergency Medicine

## 2014-08-29 DIAGNOSIS — Z7901 Long term (current) use of anticoagulants: Secondary | ICD-10-CM | POA: Diagnosis not present

## 2014-08-29 DIAGNOSIS — G43809 Other migraine, not intractable, without status migrainosus: Secondary | ICD-10-CM | POA: Diagnosis not present

## 2014-08-29 DIAGNOSIS — I480 Paroxysmal atrial fibrillation: Secondary | ICD-10-CM | POA: Diagnosis not present

## 2014-08-29 DIAGNOSIS — Z8719 Personal history of other diseases of the digestive system: Secondary | ICD-10-CM | POA: Diagnosis not present

## 2014-08-29 DIAGNOSIS — I10 Essential (primary) hypertension: Secondary | ICD-10-CM | POA: Diagnosis not present

## 2014-08-29 DIAGNOSIS — Z79899 Other long term (current) drug therapy: Secondary | ICD-10-CM | POA: Insufficient documentation

## 2014-08-29 DIAGNOSIS — Z9889 Other specified postprocedural states: Secondary | ICD-10-CM | POA: Insufficient documentation

## 2014-08-29 DIAGNOSIS — R1011 Right upper quadrant pain: Secondary | ICD-10-CM | POA: Diagnosis present

## 2014-08-29 DIAGNOSIS — R079 Chest pain, unspecified: Secondary | ICD-10-CM | POA: Diagnosis not present

## 2014-08-29 DIAGNOSIS — K219 Gastro-esophageal reflux disease without esophagitis: Secondary | ICD-10-CM | POA: Diagnosis not present

## 2014-08-29 LAB — COMPREHENSIVE METABOLIC PANEL
ALBUMIN: 4.1 g/dL (ref 3.5–5.0)
ALT: 43 U/L (ref 14–54)
AST: 42 U/L — ABNORMAL HIGH (ref 15–41)
Alkaline Phosphatase: 116 U/L (ref 38–126)
Anion gap: 9 (ref 5–15)
BUN: 12 mg/dL (ref 6–20)
CALCIUM: 9.3 mg/dL (ref 8.9–10.3)
CO2: 27 mmol/L (ref 22–32)
Chloride: 102 mmol/L (ref 101–111)
Creatinine, Ser: 0.96 mg/dL (ref 0.44–1.00)
GFR calc Af Amer: >60 mL/min (ref >60)
Glucose, Bld: 162 mg/dL — ABNORMAL HIGH (ref 65–99)
Potassium: 4.1 mmol/L (ref 3.5–5.1)
Sodium: 138 mmol/L (ref 135–145)
TOTAL PROTEIN: 7.5 g/dL (ref 6.5–8.1)
Total Bilirubin: 1 mg/dL (ref 0.3–1.2)

## 2014-08-29 LAB — CBC WITH DIFFERENTIAL/PLATELET
BASOS PCT: 0 % (ref 0–1)
Basophils Absolute: 0 10*3/uL (ref 0.0–0.1)
EOS ABS: 0.2 10*3/uL (ref 0.0–0.7)
Eosinophils Relative: 3 % (ref 0–5)
HCT: 39.6 % (ref 36.0–46.0)
Hemoglobin: 13.2 g/dL (ref 12.0–15.0)
LYMPHS ABS: 3.6 10*3/uL (ref 0.7–4.0)
Lymphocytes Relative: 40 % (ref 12–46)
MCH: 31.2 pg (ref 26.0–34.0)
MCHC: 33.3 g/dL (ref 30.0–36.0)
MCV: 93.6 fL (ref 78.0–100.0)
Monocytes Absolute: 0.8 10*3/uL (ref 0.1–1.0)
Monocytes Relative: 8 % (ref 3–12)
NEUTROS ABS: 4.5 10*3/uL (ref 1.7–7.7)
Neutrophils Relative %: 49 % (ref 43–77)
Platelets: 256 10*3/uL (ref 150–400)
RBC: 4.23 MIL/uL (ref 3.87–5.11)
RDW: 14.1 % (ref 11.5–15.5)
WBC: 9 10*3/uL (ref 4.0–10.5)

## 2014-08-29 LAB — LIPASE, BLOOD: LIPASE: 35 U/L (ref 22–51)

## 2014-08-29 LAB — I-STAT TROPONIN, ED: Troponin i, poc: 0.01 ng/mL (ref 0.00–0.08)

## 2014-08-29 MED ORDER — ONDANSETRON HCL 4 MG/2ML IJ SOLN
4.0000 mg | Freq: Once | INTRAMUSCULAR | Status: AC
  Administered 2014-08-29: 4 mg via INTRAVENOUS
  Filled 2014-08-29: qty 2

## 2014-08-29 MED ORDER — PANTOPRAZOLE SODIUM 40 MG IV SOLR
40.0000 mg | Freq: Once | INTRAVENOUS | Status: AC
  Administered 2014-08-29: 40 mg via INTRAVENOUS
  Filled 2014-08-29: qty 40

## 2014-08-29 MED ORDER — GI COCKTAIL ~~LOC~~
30.0000 mL | Freq: Once | ORAL | Status: AC
  Administered 2014-08-29: 30 mL via ORAL
  Filled 2014-08-29: qty 30

## 2014-08-29 MED ORDER — SODIUM CHLORIDE 0.9 % IV SOLN
1000.0000 mL | INTRAVENOUS | Status: DC
  Administered 2014-08-29: 1000 mL via INTRAVENOUS

## 2014-08-29 MED ORDER — PANTOPRAZOLE SODIUM 20 MG PO TBEC: 20.0000 mg | DELAYED_RELEASE_TABLET | Freq: Every day | ORAL | Status: DC

## 2014-08-29 MED ORDER — SODIUM CHLORIDE 0.9 % IV SOLN
1000.0000 mL | Freq: Once | INTRAVENOUS | Status: AC
  Administered 2014-08-29: 1000 mL via INTRAVENOUS

## 2014-08-29 NOTE — ED Notes (Signed)
THE PT HAS HAD RT SIDED UPPER CHEST AND EPIGASTRIC PAIN FOIR 2 WEEKS WORSE TODAY,  MORE FREQUENT  HEART BURN  SHE HAS PAIN THAT RADIATES POSTERIUOIRLY.  AT PRESENT  MILD PAIN THAT ESCALATES  INTERMITTENTLY

## 2014-08-29 NOTE — ED Notes (Signed)
The pt has not had chest pain since shge had the med

## 2014-08-29 NOTE — Discharge Instructions (Signed)
Abdominal Pain Many things can cause abdominal pain. Usually, abdominal pain is not caused by a disease and will improve without treatment. It can often be observed and treated at home. Your health care provider will do a physical exam and possibly order blood tests and X-rays to help determine the seriousness of your pain. However, in many cases, more time must pass before a clear cause of the pain can be found. Before that point, your health care provider may not know if you need more testing or further treatment. HOME CARE INSTRUCTIONS  Monitor your abdominal pain for any changes. The following actions may help to alleviate any discomfort you are experiencing:  Only take over-the-counter or prescription medicines as directed by your health care provider.  Do not take laxatives unless directed to do so by your health care provider.  Try a clear liquid diet (broth, tea, or water) as directed by your health care provider. Slowly move to a bland diet as tolerated. SEEK MEDICAL CARE IF:  You have unexplained abdominal pain.  You have abdominal pain associated with nausea or diarrhea.  You have pain when you urinate or have a bowel movement.  You experience abdominal pain that wakes you in the night.  You have abdominal pain that is worsened or improved by eating food.  You have abdominal pain that is worsened with eating fatty foods.  You have a fever. SEEK IMMEDIATE MEDICAL CARE IF:   Your pain does not go away within 2 hours.  You keep throwing up (vomiting).  Your pain is felt only in portions of the abdomen, such as the right side or the left lower portion of the abdomen.  You pass bloody or black tarry stools. MAKE SURE YOU:  Understand these instructions.   Will watch your condition.   Will get help right away if you are not doing well or get worse.  Document Released: 12/27/2004 Document Revised: 03/24/2013 Document Reviewed: 11/26/2012 [facility] Patient Information  2015 [place], [place]. This information is not intended to replace advice given to you by your health care provider. Make sure you discuss any questions you have with your health care provider.  Gastroesophageal Reflux Disease, Adult Gastroesophageal reflux disease (GERD) happens when acid from your stomach goes into your food pipe (esophagus). The acid can cause a burning feeling in your chest. Over time, the acid can make small holes (ulcers) in your food pipe.  HOME CARE  Ask your doctor for advice about:  Losing weight.  Quitting smoking.  Alcohol use.  Avoid foods and drinks that make your problems worse. You may want to avoid:  Caffeine and alcohol.  Chocolate.  Mints.  Garlic and onions.  Spicy foods.  Citrus fruits, such as oranges, lemons, or limes.  Foods that contain tomato, such as sauce, chili, salsa, and pizza.  Fried and fatty foods.  Avoid lying down for 3 hours before you go to bed or before you take a nap.  Eat small meals often, instead of large meals.  Wear loose-fitting clothing. Do not wear anything tight around your waist.  Raise (elevate) the head of your bed 6 to 8 inches with wood blocks. Using extra pillows does not help.  Only take medicines as told by your doctor.  Do not take aspirin or ibuprofen. GET HELP RIGHT AWAY IF:   You have pain in your arms, neck, jaw, teeth, or back.  Your pain gets worse or changes.  You feel sick to your stomach (nauseous), throw up (  vomit), or sweat (diaphoresis).  You feel short of breath, or you pass out (faint).  Your throw up is green, yellow, black, or looks like coffee grounds or blood.  Your poop (stool) is red, bloody, or black. MAKE SURE YOU:   Understand these instructions.  Will watch your condition.  Will get help right away if you are not doing well or get worse. Document Released: 09/05/2007 Document Revised: 06/11/2011 Document Reviewed: 10/06/2010 [facility] Patient Information  2015 [place], [place]. This information is not intended to replace advice given to you by your health care provider. Make sure you discuss any questions you have with your health care provider.

## 2014-08-29 NOTE — ED Notes (Signed)
THE PT IS VERY NERVOUS AND APPREHENSIVE

## 2014-08-29 NOTE — ED Notes (Signed)
Iv placed med given 

## 2014-08-29 NOTE — ED Notes (Signed)
1st liter infused 2nd hung

## 2014-08-29 NOTE — ED Notes (Addendum)
Pt here for RUQ pain radiating into right chest. Denies N,V,D. sts it feels like gas and had similar episode 2 weeks ago. sts she took 3 Tums without relief.

## 2014-08-29 NOTE — ED Provider Notes (Signed)
CSN: [id number]     Arrival date & time 08/29/14  1858 History   First MD Initiated Contact with Patient 08/29/14 1947     Chief Complaint  Patient presents with  . Chest Pain  . Abdominal Pain    Patient is a 64 y.o. female presenting with abdominal pain. The history is provided by the patient.  Abdominal Pain Pain location:  RUQ Pain quality: aching and pressure   Pain radiates to:  Chest (right side) Pain severity:  Moderate Onset quality:  Gradual Duration:  1 hour Timing:  Constant Chronicity:  Recurrent (similar episode a few weeks ago.) Relieved by:  Nothing Worsened by:  Deep breathing Associated symptoms: chest pain   Associated symptoms: no cough, no fever, no shortness of breath and no vomiting   Pt feels like it is gurgling and burning inside.  Past Medical History  Diagnosis Date  . Ocular migraine   . Hypertension   . Palpitations     PAC's,PVC's,atrial tachycardia  . Paroxysmal atrial fibrillation   . Liver cirrhosis secondary to NASH (nonalcoholic steatohepatitis)    Past Surgical History  Procedure Laterality Date  . Breast biopsy  1994  . Cardiac catheterization  11/05/2007    normal LV fx,patent coronary arteries  . US echocardiography  11/26/2007    mild LVH,trace MR,TR  . Nm myoview ltd  03/12/2005    normal   Family History  Problem Relation Age of Onset  . Diabetes Mother   . Hypertension Mother   . Kidney disease Mother   . Diabetes Father   . Hypertension Father    History  Substance Use Topics  . Smoking status: Never Smoker   . Smokeless tobacco: Not on file  . Alcohol Use: No   OB History    No data available     Review of Systems  Constitutional: Negative for fever.  Respiratory: Negative for cough and shortness of breath.   Cardiovascular: Positive for chest pain.  Gastrointestinal: Positive for abdominal pain. Negative for vomiting.  All other systems reviewed and are negative.     Allergies  Sulfa antibiotics  Home  Medications   Prior to Admission medications   Medication Sig Start Date End Date Taking? Authorizing Provider  apixaban (ELIQUIS) 5 MG TABS tablet Take 1 tablet (5 mg total) by mouth 2 (two) times daily. 05/10/14  Yes Mihai Croitoru, MD  metoprolol succinate (TOPROL-XL) 50 MG 24 hr tablet Take 3 tablets (150 mg total) by mouth daily. Take with or immediately following a meal.  You can take an extra 50mg  with increased palpitations. Patient taking differently: Take 50 mg by mouth 3 (three) times daily. Take with or immediately following a meal.  You can take an extra 50mg  with increased palpitations. 02/24/14  Yes Mihai Croitoru, MD  valsartan (DIOVAN) 160 MG tablet Take 160 mg by mouth daily.   Yes Historical Provider, MD  Vitamin D, Ergocalciferol, (DRISDOL) 50000 UNITS CAPS Take 50,000 Units by mouth every 7 (seven) days. Fridays or Saturdays   Yes Historical Provider, MD  pantoprazole (PROTONIX) 20 MG tablet Take 1 tablet (20 mg total) by mouth daily. 08/29/14   Dorie Rank, MD   BP 138/65 mmHg  Pulse 62  Temp(Src) 98.4 F (36.9 C) (Oral)  Resp 15  SpO2 94% Physical Exam  Constitutional: She appears well-developed and well-nourished. No distress.  HENT:  Head: Normocephalic and atraumatic.  Right Ear: External ear normal.  Left Ear: External ear normal.  Eyes: Conjunctivae are  normal. Right eye exhibits no discharge. Left eye exhibits no discharge. No scleral icterus.  Neck: Neck supple. No tracheal deviation present.  Cardiovascular: Normal rate, regular rhythm and intact distal pulses.   Pulmonary/Chest: Effort normal and breath sounds normal. No stridor. No respiratory distress. She has no wheezes. She has no rales.  Abdominal: Soft. Bowel sounds are normal. She exhibits no distension. There is tenderness in the epigastric area. There is no rebound and no guarding.  Musculoskeletal: She exhibits no edema or tenderness.  Neurological: She is alert. She has normal strength. No cranial  nerve deficit (no facial droop, extraocular movements intact, no slurred speech) or sensory deficit. She exhibits normal muscle tone. She displays no seizure activity. Coordination normal.  Skin: Skin is warm and dry. No rash noted.  Psychiatric: She has a normal mood and affect.  Nursing note and vitals reviewed.   ED Course  Procedures (including critical care time) Labs Review Labs Reviewed  COMPREHENSIVE METABOLIC PANEL - Abnormal; Notable for the following:    Glucose, Bld 162 (*)    AST 42 (*)    All other components within normal limits  CBC WITH DIFFERENTIAL/PLATELET  LIPASE, BLOOD  URINALYSIS, ROUTINE W REFLEX MICROSCOPIC (NOT AT [facility])  Randolm Idol, ED    Imaging Review Dg Chest 2 View  08/29/2014   CLINICAL DATA:  Left-sided chest pain.  EXAM: CHEST - 2 VIEW  COMPARISON:  Two-view chest 01/15/2014.  FINDINGS: The heart size is normal. Mild pulmonary vascular congestion is new since the prior study. No focal airspace disease is evident. The visualized soft tissues and bony thorax are unremarkable.  IMPRESSION: 1. New mild pulmonary vascular congestion without frank edema. 2. No focal airspace disease.   Electronically Signed   By: San Morelle M.D.   On: 08/29/2014 21:12   US Abdomen Complete  08/29/2014   CLINICAL DATA:  RIGHT upper quadrant pain beginning at 5 p.m. History of liver cirrhosis secondary to nonalcoholic steatohepatitis.  EXAM: ULTRASOUND ABDOMEN COMPLETE  COMPARISON:  None.  FINDINGS: Gallbladder: Contracted gallbladder, gallbladder wall measures 3-4 mm, no pericholecystic fluid, gallstones. No sonographic Murphy's sign elicited.  Common bile duct: Diameter: 7.8 mm  Liver: Diffusely echogenic, with focal fatty sparing adjacent to the gallbladder fossa. No intrahepatic biliary dilatation. Hepatopetal portal vein.  IVC: No abnormality visualized.  Pancreas: Visualized portion unremarkable.  Spleen: Size and appearance within normal limits. Focal echogenic  avascular 1.4 x 1.6 x 1.7 cm lesion.  Right Kidney: Length: 1.9 cm. Echogenicity within normal limits. No mass or hydronephrosis visualized. Mild pelviectasis.  Left Kidney: Length: 11.8 cm. Echogenicity within normal limits. No mass or hydronephrosis visualized.  Abdominal aorta: No aneurysm visualized.  Other findings: None.  IMPRESSION: Contracted gallbladder without sonographic findings of acute cholecystitis. Mildly prominent Common bile duct without sonographic findings of choledocholithiasis.  Avascular 1.7 cm splenic lesion could reflect atypical hemangioma or scarring, without suspicious features.  Echogenic liver consistent with steatosis.   Electronically Signed   By: Elon Alas M.D.   On: 08/29/2014 22:01     EKG Interpretation   Date/Time:  Sunday Aug 29 2014 19:07:13 EDT Ventricular Rate:  74 PR Interval:  140 QRS Duration: 86 QT Interval:  400 QTC Calculation: 444 R Axis:   23 Text Interpretation:  Normal sinus rhythm Normal ECG No significant change  since last tracing Confirmed by Tinita Brooker  MD-J, Niomi Valent ([id number]) on 08/29/2014  8:02:20 PM      MDM   Final diagnoses:  RUQ  pain  Gastroesophageal reflux disease, esophagitis presence not specified    Patient's treatment in improved with a GI cocktail. Suspect her symptoms were related to gastroesophageal reflux disease. Her symptoms are atypical for acute coronary syndrome. EKG and cardiac enzymes are unremarkable.  Have any evidence of cholelithiasis on her ultrasound.  Patient feels much better and is anxious to go home. I discussed the findings with the patient and her husband. She is ready for discharge.    Dorie Rank, MD 08/29/14 [phone]

## 2014-09-02 ENCOUNTER — Other Ambulatory Visit: Payer: Self-pay | Admitting: Obstetrics and Gynecology

## 2014-09-03 LAB — CYTOLOGY - PAP

## 2014-09-13 ENCOUNTER — Telehealth: Payer: Self-pay | Admitting: Cardiovascular Disease

## 2014-09-13 NOTE — Telephone Encounter (Signed)
Pt having filling/cleaning done on Thursday - per protocol, advised no need to d/c Eliquis. Pt voiced understanding.

## 2014-09-13 NOTE — Telephone Encounter (Signed)
Pt is going to have dental work on Thursday. What does she need to do about her Eliquis?

## 2014-11-01 ENCOUNTER — Telehealth: Payer: Self-pay | Admitting: Cardiovascular Disease

## 2014-11-01 NOTE — Telephone Encounter (Signed)
Patient notified of Kristin's advice. She voiced understanding.

## 2014-11-01 NOTE — Telephone Encounter (Signed)
Ok to take flurbiprofen for 2-3 days (with food).  After that please go back to acetaminophen

## 2014-11-01 NOTE — Telephone Encounter (Signed)
Tooth extracted last Thursday. Off eliquis 3 days prior. Pain from this. Was just taking tylenol - didn't want to take codeine. Dentist prescribed Flurbiprofen - NSAID. She is unsure what to do about taking this b/c she is on eliquis.   Routed to [place], PharmD to advise

## 2014-11-01 NOTE — Telephone Encounter (Signed)
Please call,question about some medicine(Flurbiprofen) her dentist prescribed.

## 2014-12-07 ENCOUNTER — Telehealth: Payer: Self-pay | Admitting: Cardiovascular Disease

## 2014-12-07 ENCOUNTER — Ambulatory Visit: Payer: 59

## 2014-12-07 DIAGNOSIS — R002 Palpitations: Secondary | ICD-10-CM

## 2014-12-07 DIAGNOSIS — I4891 Unspecified atrial fibrillation: Secondary | ICD-10-CM

## 2014-12-07 NOTE — Telephone Encounter (Signed)
Pt says she was waiting  to hear something. Dr C came and said for her to come on in for an EKG.Pt was notified as I was on the phone with her.

## 2014-12-07 NOTE — Telephone Encounter (Signed)
Patient called and complains of a tickling sensation at the base of her neck and going down a little farther and relieved by cough that occurs about every 10 minutes today.  Concerned that it could be something related to her arrhythmias. Patient was wondering if it is necessary to have an EKG  Said her HR is in the 60's without irregularity  Goes away with a cough.  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    Will route to Dr. Sallyanne Kuster

## 2014-12-07 NOTE — Telephone Encounter (Signed)
Patient present at office.  She is her to have EKG done. RN completed EKG . EKG reviewed by DR Croitoru. Per order by Dr Sallyanne Kuster , 24 hour holter monitor applied instead of 48 hour . Instruction given to patient.she verbalized understanding. RN escorted patient to her car. She uneasy about riding an elevator by herself.

## 2014-12-07 NOTE — Telephone Encounter (Signed)
Pt is calling in stating that this morning she has been experiencing some flutters and she thinks that she could possibly need an EKG. Please call and speak with her   Thanks

## 2014-12-07 NOTE — Telephone Encounter (Signed)
Would prefer a Holter monitor, since a 10 second ECG might miss an event that occurs only every 10 minutes. OK to come in for ECG, but to get Holter when she comes if the ECG is nondiagnostic

## 2014-12-07 NOTE — Telephone Encounter (Signed)
Called patient LVM to come in for EKG before 5pm today or tomorrow morning.  If EKG non-diagnostic to have Holter monitor.  Both orders have been placed in the system.  Needs nurse visit for EKG

## 2014-12-09 ENCOUNTER — Ambulatory Visit (INDEPENDENT_AMBULATORY_CARE_PROVIDER_SITE_OTHER): Payer: 59

## 2014-12-09 DIAGNOSIS — I4891 Unspecified atrial fibrillation: Secondary | ICD-10-CM | POA: Diagnosis not present

## 2014-12-09 DIAGNOSIS — R002 Palpitations: Secondary | ICD-10-CM | POA: Diagnosis not present

## 2014-12-09 NOTE — Progress Notes (Signed)
Patient came in for EKG. See telephone encounter from 12/07/14.   EKG read and reviewed by Dr Sallyanne Kuster. MC recommended patient wear a 24 hour holter monitor.  Monitor ordered and placed on patient. Patient given instructions and materials for monitor.

## 2014-12-14 ENCOUNTER — Telehealth: Payer: Self-pay | Admitting: Cardiovascular Disease

## 2014-12-14 NOTE — Telephone Encounter (Signed)
Returning your call. °

## 2014-12-16 NOTE — Telephone Encounter (Signed)
Holter results given to patient 12/14/14.  Patient voiced understanding.

## 2015-01-01 ENCOUNTER — Other Ambulatory Visit: Payer: Self-pay | Admitting: Cardiovascular Disease

## 2015-02-05 ENCOUNTER — Encounter (HOSPITAL_COMMUNITY): Payer: Self-pay

## 2015-02-05 ENCOUNTER — Emergency Department (HOSPITAL_COMMUNITY)
Admission: EM | Admit: 2015-02-05 | Discharge: 2015-02-05 | Disposition: A | Discharge status: Home / Self Care | Payer: 59 | Attending: Emergency Medicine | Admitting: Emergency Medicine

## 2015-02-05 DIAGNOSIS — R079 Chest pain, unspecified: Secondary | ICD-10-CM | POA: Diagnosis not present

## 2015-02-05 DIAGNOSIS — T378X5A Adverse effect of other specified systemic anti-infectives and antiparasitics, initial encounter: Secondary | ICD-10-CM | POA: Insufficient documentation

## 2015-02-05 DIAGNOSIS — I480 Paroxysmal atrial fibrillation: Secondary | ICD-10-CM | POA: Insufficient documentation

## 2015-02-05 DIAGNOSIS — T7840XA Allergy, unspecified, initial encounter: Secondary | ICD-10-CM

## 2015-02-05 DIAGNOSIS — L271 Localized skin eruption due to drugs and medicaments taken internally: Secondary | ICD-10-CM | POA: Diagnosis not present

## 2015-02-05 DIAGNOSIS — Z9889 Other specified postprocedural states: Secondary | ICD-10-CM | POA: Diagnosis not present

## 2015-02-05 DIAGNOSIS — I10 Essential (primary) hypertension: Secondary | ICD-10-CM | POA: Diagnosis not present

## 2015-02-05 DIAGNOSIS — Z8719 Personal history of other diseases of the digestive system: Secondary | ICD-10-CM | POA: Insufficient documentation

## 2015-02-05 DIAGNOSIS — R21 Rash and other nonspecific skin eruption: Secondary | ICD-10-CM | POA: Diagnosis present

## 2015-02-05 DIAGNOSIS — Z79899 Other long term (current) drug therapy: Secondary | ICD-10-CM | POA: Diagnosis not present

## 2015-02-05 MED ORDER — FAMOTIDINE 20 MG PO TABS
40.0000 mg | ORAL_TABLET | Freq: Once | ORAL | Status: AC
  Administered 2015-02-05: 40 mg via ORAL
  Filled 2015-02-05: qty 2

## 2015-02-05 MED ORDER — FAMOTIDINE 20 MG PO TABS: 20.0000 mg | ORAL_TABLET | Freq: Two times a day (BID) | ORAL | Status: DC

## 2015-02-05 MED ORDER — CEPHALEXIN 500 MG PO CAPS: 500.0000 mg | ORAL_CAPSULE | Freq: Three times a day (TID) | ORAL | Status: DC

## 2015-02-05 MED ORDER — DIPHENHYDRAMINE HCL 25 MG PO CAPS
25.0000 mg | ORAL_CAPSULE | Freq: Once | ORAL | Status: AC
  Administered 2015-02-05: 25 mg via ORAL
  Filled 2015-02-05: qty 1

## 2015-02-05 MED ORDER — PREDNISONE 10 MG PO TABS: 40.0000 mg | ORAL_TABLET | Freq: Every day | ORAL | Status: DC

## 2015-02-05 MED ORDER — PREDNISONE 20 MG PO TABS
60.0000 mg | ORAL_TABLET | Freq: Once | ORAL | Status: AC
  Administered 2015-02-05: 60 mg via ORAL
  Filled 2015-02-05: qty 3

## 2015-02-05 MED ORDER — EPINEPHRINE 0.3 MG/0.3ML IJ SOAJ: 0.3000 mg | Freq: Once | INTRAMUSCULAR | Status: DC

## 2015-02-05 MED ORDER — DIPHENHYDRAMINE HCL 25 MG PO TABS: 25.0000 mg | ORAL_TABLET | Freq: Four times a day (QID) | ORAL | Status: DC

## 2015-02-05 NOTE — ED Notes (Signed)
She c/o "burning" rash at hands and feet; the commencement of which consides with her beginning to take a course of Nitrofurantoin for uti.  She c/o hx of anxiety, with which she gets some chest discomfort, of which she is complaining now--EKG performed at triage.  She is in no distress.

## 2015-02-05 NOTE — ED Notes (Signed)
EDPs at bedside 

## 2015-02-05 NOTE — ED Notes (Signed)
Patient states she took [place] @ 11:30 this morning for a UTI.  Patient states approximately 2 hours after taking that medication, she developed urticaria to abdomen, back, arms, legs, feet and hands.  Patient denies wheezing or SOB.  Patient denies N/V and sensation that her airway is occluded.  Patient states she had one episode of chest pain for approximately 20 minutes en route to ED.  On exam, no stridor is heard.  Patient's airway is patent.  Lungs clear to auscultation bilaterally, no wheezing.  Patient's heart sounds, S1/S2, no rub, murmur or gallop.  RRR

## 2015-02-05 NOTE — Discharge Instructions (Signed)

## 2015-02-05 NOTE — ED Provider Notes (Signed)
CSN: [id number]     Arrival date & time 02/05/15  1330 History   First MD Initiated Contact with Patient 02/05/15 1352     Chief Complaint  Patient presents with  . Allergic Reaction     (Consider location/radiation/quality/duration/timing/severity/associated sxs/prior Treatment) Patient is a 64 y.o. female presenting with allergic reaction. The history is provided by the patient.  Allergic Reaction Presenting symptoms: rash    patient presents with a rash. Began around 2 hours after taking Macrobid for UTI. She's had Macrobid before without problems. The rashes on her palms and feet and his itchy. It is been worsening little bit. Previous history of allergies to Bactrim when she was child. No trouble breathing. States she got a little anxious and developed a chest pain, which is not unusual for her with her anxiety. No swelling in her mouth.  Past Medical History  Diagnosis Date  . Ocular migraine   . Hypertension   . Palpitations     PAC's,PVC's,atrial tachycardia  . Paroxysmal atrial fibrillation (HCC)   . Liver cirrhosis secondary to NASH (nonalcoholic steatohepatitis)    Past Surgical History  Procedure Laterality Date  . Breast biopsy  1994  . Cardiac catheterization  11/05/2007    normal LV fx,patent coronary arteries  . US echocardiography  11/26/2007    mild LVH,trace MR,TR  . Nm myoview ltd  03/12/2005    normal   Family History  Problem Relation Age of Onset  . Diabetes Mother   . Hypertension Mother   . Kidney disease Mother   . Diabetes Father   . Hypertension Father    Social History  Substance Use Topics  . Smoking status: Never Smoker   . Smokeless tobacco: None  . Alcohol Use: No   OB History    No data available     Review of Systems  Constitutional: Negative for fever.  HENT: Negative for congestion.   Respiratory: Negative for shortness of breath.   Cardiovascular: Positive for chest pain.  Gastrointestinal: Negative for abdominal pain.   Genitourinary: Negative for flank pain.  Musculoskeletal: Negative for back pain.  Skin: Positive for rash.      Allergies  Sulfa antibiotics  Home Medications   Prior to Admission medications   Medication Sig Start Date End Date Taking? Authorizing Provider  acetaminophen (TYLENOL) 325 MG tablet Take 325 mg by mouth every 6 (six) hours as needed for moderate pain or headache.   Yes Historical Provider, MD  adapalene (DIFFERIN) 0.1 % cream Apply 1 application topically daily as needed (breakouts).   Yes Historical Provider, MD  ELIQUIS 5 MG TABS tablet TAKE 1 TABLET (5 MG TOTAL) BY MOUTH 2 (TWO) TIMES DAILY. 01/03/15  Yes Mihai Croitoru, MD  fexofenadine (ALLEGRA) 180 MG tablet Take 180 mg by mouth daily as needed for allergies or rhinitis.   Yes Historical Provider, MD  metoprolol succinate (TOPROL-XL) 50 MG 24 hr tablet Take 3 tablets (150 mg total) by mouth daily. Take with or immediately following a meal.  You can take an extra 50mg  with increased palpitations. Patient taking differently: Take 50-100 mg by mouth 3 (three) times daily. Take 100mg  in the morning and 50mg  in the evening. 02/24/14  Yes Mihai Croitoru, MD  nitrofurantoin, macrocrystal-monohydrate, (MACROBID) 100 MG capsule Take 100 mg by mouth 2 (two) times daily. 02/05/15  Yes Historical Provider, MD  valsartan (DIOVAN) 160 MG tablet Take 160 mg by mouth daily.   Yes Historical Provider, MD  Vitamin D, Ergocalciferol, (DRISDOL)  50000 UNITS CAPS Take 50,000 Units by mouth every 7 (seven) days. Thursdays   Yes Historical Provider, MD  pantoprazole (PROTONIX) 20 MG tablet Take 1 tablet (20 mg total) by mouth daily. 08/29/14   Dorie Rank, MD   BP 124/73 mmHg  Pulse 85  Temp(Src) 98.3 F (36.8 C) (Oral)  Resp 17  SpO2 94% Physical Exam  Constitutional: She appears well-developed.  HENT:  Head: Atraumatic.  Cardiovascular: Normal rate.   Pulmonary/Chest: Effort normal.  Neurological: She is alert.  Skin: Skin is warm.   Hives to hands and feet. Some form abdomen involvement. Worsened over reevaluation.    ED Course  Procedures (including critical care time) Labs Review Labs Reviewed - No data to display  Imaging Review No results found. I have personally reviewed and evaluated these images and lab results as part of my medical decision-making.   EKG Interpretation   Date/Time:  Saturday February 05 2015 13:36:52 EDT Ventricular Rate:  89 PR Interval:  161 QRS Duration: 83 QT Interval:  375 QTC Calculation: 456 R Axis:   29 Text Interpretation:  Sinus rhythm Consider anterior infarct Confirmed by  Alvino Chapel  MD, Ovid Curd [id number]) on 02/05/2015 2:10:31 PM      MDM   Final diagnoses:  Allergic reaction, initial encounter    Patient with apparent allergic reaction likely to Macrobid. Has worsened during evaluation. Still no airway involvement. Will re-dose with Benadryl and give Pepcid. Care turned over to Dr. Melba Coon, MD 02/05/15 1534

## 2015-02-12 ENCOUNTER — Emergency Department (HOSPITAL_COMMUNITY)
Admission: EM | Admit: 2015-02-12 | Discharge: 2015-02-12 | Disposition: A | Discharge status: Home / Self Care | Payer: 59 | Attending: Emergency Medicine | Admitting: Emergency Medicine

## 2015-02-12 ENCOUNTER — Encounter (HOSPITAL_COMMUNITY): Payer: Self-pay | Admitting: Emergency Medicine

## 2015-02-12 DIAGNOSIS — Z79899 Other long term (current) drug therapy: Secondary | ICD-10-CM | POA: Insufficient documentation

## 2015-02-12 DIAGNOSIS — Z792 Long term (current) use of antibiotics: Secondary | ICD-10-CM | POA: Insufficient documentation

## 2015-02-12 DIAGNOSIS — Z9889 Other specified postprocedural states: Secondary | ICD-10-CM | POA: Insufficient documentation

## 2015-02-12 DIAGNOSIS — Z8719 Personal history of other diseases of the digestive system: Secondary | ICD-10-CM | POA: Diagnosis not present

## 2015-02-12 DIAGNOSIS — R0602 Shortness of breath: Secondary | ICD-10-CM | POA: Diagnosis present

## 2015-02-12 DIAGNOSIS — I10 Essential (primary) hypertension: Secondary | ICD-10-CM | POA: Diagnosis not present

## 2015-02-12 DIAGNOSIS — Z7901 Long term (current) use of anticoagulants: Secondary | ICD-10-CM | POA: Diagnosis not present

## 2015-02-12 DIAGNOSIS — F419 Anxiety disorder, unspecified: Secondary | ICD-10-CM | POA: Diagnosis not present

## 2015-02-12 DIAGNOSIS — I480 Paroxysmal atrial fibrillation: Secondary | ICD-10-CM

## 2015-02-12 LAB — COMPREHENSIVE METABOLIC PANEL
ALBUMIN: 4.3 g/dL (ref 3.5–5.0)
ALK PHOS: 127 U/L — AB (ref 38–126)
ALT: 61 U/L — AB (ref 14–54)
ANION GAP: 12 (ref 5–15)
AST: 49 U/L — ABNORMAL HIGH (ref 15–41)
BUN: 6 mg/dL (ref 6–20)
CALCIUM: 9.7 mg/dL (ref 8.9–10.3)
CHLORIDE: 100 mmol/L — AB (ref 101–111)
CO2: 25 mmol/L (ref 22–32)
Creatinine, Ser: 0.91 mg/dL (ref 0.44–1.00)
GFR calc non Af Amer: >60 mL/min (ref >60)
GLUCOSE: 115 mg/dL — AB (ref 65–99)
Potassium: 3.8 mmol/L (ref 3.5–5.1)
SODIUM: 137 mmol/L (ref 135–145)
Total Bilirubin: 1.2 mg/dL (ref 0.3–1.2)
Total Protein: 7 g/dL (ref 6.5–8.1)

## 2015-02-12 LAB — TROPONIN I

## 2015-02-12 LAB — CBC
HEMATOCRIT: 40.9 % (ref 36.0–46.0)
HEMOGLOBIN: 13.4 g/dL (ref 12.0–15.0)
MCH: 30.6 pg (ref 26.0–34.0)
MCHC: 32.8 g/dL (ref 30.0–36.0)
MCV: 93.4 fL (ref 78.0–100.0)
Platelets: 307 10*3/uL (ref 150–400)
RBC: 4.38 MIL/uL (ref 3.87–5.11)
RDW: 13.8 % (ref 11.5–15.5)
WBC: 13 10*3/uL — AB (ref 4.0–10.5)

## 2015-02-12 LAB — MAGNESIUM: Magnesium: 1.9 mg/dL (ref 1.7–2.4)

## 2015-02-12 MED ORDER — FLECAINIDE ACETATE 100 MG PO TABS
300.0000 mg | ORAL_TABLET | Freq: Once | ORAL | Status: AC
  Administered 2015-02-12: 300 mg via ORAL
  Filled 2015-02-12: qty 3

## 2015-02-12 MED ORDER — LORAZEPAM 2 MG/ML IJ SOLN
1.0000 mg | Freq: Once | INTRAMUSCULAR | Status: AC
  Administered 2015-02-12: 1 mg via INTRAVENOUS
  Filled 2015-02-12: qty 1

## 2015-02-12 MED ORDER — DILTIAZEM HCL 25 MG/5ML IV SOLN
10.0000 mg | Freq: Once | INTRAVENOUS | Status: AC
  Administered 2015-02-12: 10 mg via INTRAVENOUS
  Filled 2015-02-12: qty 5

## 2015-02-12 NOTE — ED Provider Notes (Addendum)
CSN: [id number]     Arrival date & time 02/12/15  0135 History  By signing my name below, I, Amanda Frost, attest that this documentation has been prepared under the direction and in the presence of [name], MD . Electronically Signed: Evelene Frost, Scribe. 02/12/2015. 3:45 AM.    Chief Complaint  Patient presents with  . Atrial Fibrillation    The history is provided by the patient. No language interpreter was used.     HPI Comments:  Amanda Frost is a 64 y.o. female  who presents to the Emergency Department complaining of palpations with associated SOB that woke her from her sleep this AM. Pt states she feels like her heart is going to explode. She notes she was fine before bed. Pt has a h/o same secondary to Afib with her first episode ~ 1 year ago. She is currently on metoprolol and denies recent increase in caffeine intake. No alleviating factors noted.  Past Medical History  Diagnosis Date  . Ocular migraine   . Hypertension   . Palpitations     PAC's,PVC's,atrial tachycardia  . Paroxysmal atrial fibrillation (HCC)   . Liver cirrhosis secondary to NASH (nonalcoholic steatohepatitis)    Past Surgical History  Procedure Laterality Date  . Breast biopsy  1994  . Cardiac catheterization  11/05/2007    normal LV fx,patent coronary arteries  . US echocardiography  11/26/2007    mild LVH,trace MR,TR  . Nm myoview ltd  03/12/2005    normal   Family History  Problem Relation Age of Onset  . Diabetes Mother   . Hypertension Mother   . Kidney disease Mother   . Diabetes Father   . Hypertension Father    Social History  Substance Use Topics  . Smoking status: Never Smoker   . Smokeless tobacco: None  . Alcohol Use: No   OB History    No data available     Review of Systems  Constitutional: Negative for fever.  Respiratory: Positive for shortness of breath.   Cardiovascular: Positive for palpitations.  All other systems reviewed and are  negative.   Allergies  Macrodantin and Sulfa antibiotics  Home Medications   Prior to Admission medications   Medication Sig Start Date End Date Taking? Authorizing Provider  acetaminophen (TYLENOL) 325 MG tablet Take 325 mg by mouth every 6 (six) hours as needed for moderate pain or headache.   Yes Historical Provider, MD  adapalene (DIFFERIN) 0.1 % cream Apply 1 application topically daily as needed (breakouts).   Yes Historical Provider, MD  cephALEXin (KEFLEX) 500 MG capsule Take 1 capsule (500 mg total) by mouth 3 (three) times daily. 02/05/15  Yes Leo Grosser, MD  ELIQUIS 5 MG TABS tablet TAKE 1 TABLET (5 MG TOTAL) BY MOUTH 2 (TWO) TIMES DAILY. 01/03/15  Yes Mihai Croitoru, MD  EPINEPHrine 0.3 mg/0.3 mL IJ SOAJ injection Inject 0.3 mLs (0.3 mg total) into the muscle once. Patient taking differently: Inject 0.3 mg into the muscle daily as needed (allergic reaction).  02/05/15  Yes Leo Grosser, MD  fexofenadine (ALLEGRA) 180 MG tablet Take 180 mg by mouth daily as needed for allergies or rhinitis.   Yes Historical Provider, MD  metoprolol succinate (TOPROL-XL) 50 MG 24 hr tablet Take 3 tablets (150 mg total) by mouth daily. Take with or immediately following a meal.  You can take an extra 50mg  with increased palpitations. Patient taking differently: Take 50-100 mg by mouth 3 (three) times daily. Take 100mg  in  the morning and 50mg  in the evening. 02/24/14  Yes Mihai Croitoru, MD  valsartan (DIOVAN) 160 MG tablet Take 160 mg by mouth daily.   Yes Historical Provider, MD  Vitamin D, Ergocalciferol, (DRISDOL) 50000 UNITS CAPS Take 50,000 Units by mouth every 7 (seven) days. Thursdays   Yes Historical Provider, MD   BP 117/77 mmHg  Pulse 108  Temp(Src) 99.4 F (37.4 C) (Oral)  Resp 19  SpO2 95% Physical Exam  Constitutional: She is oriented to person, place, and time. She appears well-developed and well-nourished. No distress.  HENT:  Head: Normocephalic and atraumatic.  Eyes: EOM are  normal.  Neck: Normal range of motion.  Cardiovascular: Normal heart sounds.  An irregularly irregular rhythm present. Tachycardia present.   Pulmonary/Chest: Effort normal and breath sounds normal.  Abdominal: Soft. She exhibits no distension. There is no tenderness.  Musculoskeletal: Normal range of motion.  Neurological: She is alert and oriented to person, place, and time.  Skin: Skin is warm and dry.  Psychiatric: Judgment normal. Her mood appears anxious.  Nursing note and vitals reviewed.   ED Course  Procedures   DIAGNOSTIC STUDIES:  Oxygen Saturation is 95% on RA, adequate by my interpretation.    COORDINATION OF CARE:  1:59 AM Will administer flecainide in the ED. Discussed treatment plan with pt at bedside and pt agreed to plan.  Labs Review Labs Reviewed  CBC - Abnormal; Notable for the following:    WBC 13.0 (*)    All other components within normal limits  COMPREHENSIVE METABOLIC PANEL - Abnormal; Notable for the following:    Chloride 100 (*)    Glucose, Bld 115 (*)    AST 49 (*)    ALT 61 (*)    Alkaline Phosphatase 127 (*)    All other components within normal limits  TROPONIN I  MAGNESIUM    Imaging Review No results found. I have personally reviewed and evaluated these lab results as part of my medical decision-making.   EKG Interpretation #1 Date/Time:  Saturday February 12 2015 01:42:22 EST Ventricular Rate:  168 PR Interval:    QRS Duration: 72 QT Interval:  244 QTC Calculation: 407 R Axis:   48 Text Interpretation:  Atrial fibrillation with rapid ventricular response  Marked ST abnormality, possible inferior subendocardial injury Abnormal  ECG No significant change was found Confirmed by Aaradhya Kysar  MD, Derry Arbogast ([id number])  on 02/12/2015 4:45:45 AM  ECG interpretation #2  Date: 02/12/2015 0529  Rate:  90  Rhythm: atrial fibrillation  QRS Axis: normal  Intervals: normal  ST/T Wave abnormalities: normal  Conduction Disutrbances: none   Narrative Interpretation:   Old EKG Reviewed: rate improved. Remains in afib   ECG interpretation #3  Date: 02/12/2015  Rate: 80  Rhythm: normal sinus rhythm  QRS Axis: normal  Intervals: normal  ST/T Wave abnormalities: normal  Conduction Disutrbances: none  Narrative Interpretation:   Old EKG Reviewed: afib resolved           MDM   Final diagnoses:  None    6:34 AM Patient feels much better at this time.  She converted to normal sinus rhythm.  She converted with pharmacologic therapy with lacunae.  It took 3.5 hours for her to convert with flecainide.  She'll follow-up with her cardiologist.  No change to her medications.  She understands to return to the emergency department for new or worsening symptoms   I personally performed the services described in this documentation, which was scribed in my  presence. The recorded information has been reviewed and is accurate.       [name], MD 02/12/15 [place], MD 02/12/15 [phone]

## 2015-02-12 NOTE — ED Notes (Signed)
MD at bedside. 

## 2015-02-12 NOTE — ED Notes (Signed)
Pt c/o heart beating fast and sob that woke her up approx 30 min ago.  Afib with RVR on arrival.  Hx of same.

## 2015-02-16 ENCOUNTER — Ambulatory Visit (INDEPENDENT_AMBULATORY_CARE_PROVIDER_SITE_OTHER): Payer: 59 | Admitting: Physician Assistant

## 2015-02-16 ENCOUNTER — Encounter: Payer: Self-pay | Admitting: Physician Assistant

## 2015-02-16 VITALS — BP 126/80 | HR 61 | Ht 68.0 in | Wt 177.1 lb

## 2015-02-16 DIAGNOSIS — I480 Paroxysmal atrial fibrillation: Secondary | ICD-10-CM

## 2015-02-16 MED ORDER — FLECAINIDE ACETATE 150 MG PO TABS: 150.0000 mg | ORAL_TABLET | Freq: Two times a day (BID) | ORAL | Status: DC

## 2015-02-16 NOTE — Progress Notes (Signed)
Patient ID: Amanda Frost, female   DOB: [birth year], 64 y.o.   MRN: [id number]    Date:  02/16/2015   ID:  Amanda Frost, DOB 12/27/1950, MRN [id number]  PCP:  Jerlyn Ly, MD  Primary Cardiologist:  Croitoru   Chief complaint:  Atrial flutter with rapid ventricular response   History of Present Illness: Amanda Frost is a 64 y.o. female  female who presents for follow up of paroxysmal atrial fibrillation, paroxysmal trial tachycardia and HTN.  No history of CVA/TIA.   She was seen in the emergency room on February 12, 2015 with atrial fibrillation with a rapid ventricular response. Her EKG rate was 168 bpm.  She was converted with flecainide which took about 3-1/2 half hours.  She reports being under a lot of stress that particular day but no had no excessive caffeine or alcohol. She has discussed ablation, flecainide and diltiazem with Dr. Rayann Heman in the past.   She does report some external hemorrhoid bleeding the other day and did not take the Elliquis for 1 day. This was after the The ER visit.     The patient currently denies nausea, vomiting, fever, chest pain, shortness of breath, orthopnea, dizziness, PND, cough, congestion, abdominal pain, melena, lower extremity edema, claudication.  Wt Readings from Last 3 Encounters:  02/16/15 177 lb 1.6 oz (80.332 kg)  06/30/14 172 lb 6.4 oz (78.2 kg)  05/05/14 180 lb (81.647 kg)     Past Medical History  Diagnosis Date  . Ocular migraine   . Hypertension   . Palpitations     PAC's,PVC's,atrial tachycardia  . Paroxysmal atrial fibrillation (HCC)   . Liver cirrhosis secondary to NASH (nonalcoholic steatohepatitis)     Current Outpatient Prescriptions  Medication Sig Dispense Refill  . acetaminophen (TYLENOL) 325 MG tablet Take 325 mg by mouth every 6 (six) hours as needed for moderate pain or headache.    Marland Kitchen adapalene (DIFFERIN) 0.1 % cream Apply 1 application topically daily as needed (breakouts).    Marland Kitchen ELIQUIS 5 MG  TABS tablet TAKE 1 TABLET (5 MG TOTAL) BY MOUTH 2 (TWO) TIMES DAILY. 60 tablet 6  . EPINEPHrine 0.3 mg/0.3 mL IJ SOAJ injection Inject 0.3 mg into the muscle as needed. (Anaphylaxis)  0  . fexofenadine (ALLEGRA) 180 MG tablet Take 180 mg by mouth daily as needed for allergies or rhinitis.    . metoprolol succinate (TOPROL-XL) 50 MG 24 hr tablet Take two (2) tablets (100 mg total) by mouth every morning and take one (1) tablet (50 mg total) by mouth every evening.  99  . valsartan (DIOVAN) 160 MG tablet Take 160 mg by mouth daily.    . Vitamin D, Ergocalciferol, (DRISDOL) 50000 UNITS CAPS Take 50,000 Units by mouth every 7 (seven) days. Thursdays    . flecainide (TAMBOCOR) 150 MG tablet Take 1 tablet (150 mg total) by mouth 2 (two) times daily. 2 tablet 0   No current facility-administered medications for this visit.    Allergies:    Allergies  Allergen Reactions  . Macrodantin [Nitrofurantoin Macrocrystal] Anaphylaxis  . Sulfa Antibiotics Other (See Comments)    Unsure, childhood reaction    Social History:  The patient  reports that she has never smoked. She has never used smokeless tobacco. She reports that she does not drink alcohol or use illicit drugs.   Family history:   Family History  Problem Relation Age of Onset  . Diabetes Mother   . Hypertension Mother   .  Kidney disease Mother   . Diabetes Father   . Hypertension Father     ROS:  Please see the history of present illness.  All other systems reviewed and negative.   PHYSICAL EXAM: VS:  BP 126/80 mmHg  Pulse 61  Ht 5\' 8"  (1.727 m)  Wt 177 lb 1.6 oz (80.332 kg)  BMI 26.93 kg/m2 Well nourished, well developed, in no acute distress HEENT: Pupils are equal round react to light accommodation extraocular movements are intact.  Neck: no JVDNo cervical lymphadenopathy. Cardiac: Regular rate and rhythm without murmurs rubs or gallops. Lungs:  clear to auscultation bilaterally, no wheezing, rhonchi or rales Ext: no lower  extremity edema.  2+ radial and dorsalis pedis pulses. Skin: warm and dry Neuro:  Grossly normal  EKG:  Normal sinus rhythm rate 61 bpm  ASSESSMENT AND PLAN:  Problem List Items Addressed This Visit    Paroxysmal atrial fibrillation (HCC) - Primary   Relevant Medications   EPINEPHrine 0.3 mg/0.3 mL IJ SOAJ injection   metoprolol succinate (TOPROL-XL) 50 MG 24 hr tablet   flecainide (TAMBOCOR) 150 MG tablet   Other Relevant Orders   EKG 12-Lead     Paroxysmal atrial fibrillation. Patient converted in the emergency room with flecainide. Continue that approach for now the pill in the pocket 300 mg. She is on Elliquis.  If she continues to have problems with the external hemorrhoid bleeding, she will follow-up with her primary care provider before stopping the Elliquis. Continue Toprol 100 mg every morning and 50 mg every afternoon.  Pressures well-controlled. Will have follow-up in 3 months or sooner if she's having more frequent episodes.

## 2015-02-16 NOTE — Patient Instructions (Signed)
Your physician recommends that you schedule a follow-up appointment in: 3 Months with Dr Sallyanne Kuster  Your physician has recommended you make the following change in your medication: TAKE Flecainiade 150 mg, take 2 tablets (300 mg) if heart racing.Marland Kitchen

## 2015-02-28 ENCOUNTER — Ambulatory Visit: Payer: 59 | Admitting: Physician Assistant

## 2015-04-20 ENCOUNTER — Telehealth: Payer: Self-pay | Admitting: Cardiovascular Disease

## 2015-04-20 NOTE — Telephone Encounter (Signed)
Pt prescription had already been transferred,did not need this encounter.

## 2015-05-02 ENCOUNTER — Other Ambulatory Visit: Payer: Self-pay | Admitting: Cardiovascular Disease

## 2015-05-02 NOTE — Telephone Encounter (Signed)
Rx refill sent to pharmacy. 

## 2015-05-19 ENCOUNTER — Other Ambulatory Visit: Payer: Self-pay | Admitting: Cardiovascular Disease

## 2015-05-20 NOTE — Telephone Encounter (Signed)
Rx(s) sent to pharmacy electronically.  

## 2015-05-25 ENCOUNTER — Encounter: Payer: Self-pay | Admitting: Cardiovascular Disease

## 2015-05-25 ENCOUNTER — Ambulatory Visit (INDEPENDENT_AMBULATORY_CARE_PROVIDER_SITE_OTHER): Payer: PRIVATE HEALTH INSURANCE | Admitting: Cardiovascular Disease

## 2015-05-25 VITALS — BP 134/74 | HR 67 | Ht 67.0 in | Wt 177.0 lb

## 2015-05-25 DIAGNOSIS — I480 Paroxysmal atrial fibrillation: Secondary | ICD-10-CM | POA: Diagnosis not present

## 2015-05-25 DIAGNOSIS — I10 Essential (primary) hypertension: Secondary | ICD-10-CM

## 2015-05-25 NOTE — Progress Notes (Signed)
Patient ID: Amanda Frost, female   DOB: 02-19-1951, 65 y.o.   MRN: [id number]     Cardiology Office Note    Date:  05/25/2015   ID:  Amanda Frost, DOB 22-May-1950, MRN [id number]  PCP:  Amanda Ly, MD  Cardiologist:   Amanda Klein, MD   Chief Complaint  Patient presents with  . 3 MONTHS    NO COMPLAINTS.    History of Present Illness:  Amanda Frost is a 65 y.o. 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   She has relatively frequent isolated single beat palpitations. Much less frequently she has longer episodes of "vibration" in her chest lasting for 1 or 2 hours. These only happen once every 4-6 months. They are not associated with dizziness, dyspnea, chest discomfort or other cardiac vascular complaints. Last November she was seen in the emergency room with atrial fibrillation rapid ventricular response and converted after receiving flecainide. However, the conversion happened 3-1/2 hours after the flecainide was administered and it is unclear whether it was really a drug induced cardioversion.  She has not had any focal neurological events. She has occasional nuisance bleeding such as easy bruising after injury, mild gingival bleeding, a droplet to hemorrhoidal bleeding after stools. She has not had any serious bleeding such as hematemesis, melena, frank rectal bleeding, etc.  Past Medical History  Diagnosis Date  . Ocular migraine   . Hypertension   . Palpitations     PAC's,PVC's,atrial tachycardia  . Paroxysmal atrial fibrillation (HCC)   . Liver cirrhosis secondary to NASH (nonalcoholic steatohepatitis)     Past Surgical History  Procedure Laterality Date  . Breast biopsy  1994  . Cardiac catheterization  11/05/2007    normal LV fx,patent coronary arteries  . US  echocardiography  11/26/2007    mild LVH,trace MR,TR  . Nm myoview ltd  03/12/2005    normal    Outpatient Prescriptions Prior to Visit  Medication Sig Dispense Refill  . adapalene (DIFFERIN) 0.1 % cream Apply 1 application topically daily as needed (breakouts).    Marland Kitchen ELIQUIS 5 MG TABS tablet TAKE 1 TABLET (5 MG TOTAL) BY MOUTH 2 (TWO) TIMES DAILY. 60 tablet 6  . fexofenadine (ALLEGRA) 180 MG tablet Take 180 mg by mouth daily as needed for allergies or rhinitis.    . metoprolol succinate (TOPROL-XL) 50 MG 24 hr tablet Take 2 tablets (100 mg total) by mouth every morning and take 1 tablet (50 mg total) by mouth every evening. 270 tablet 3  . valsartan (DIOVAN) 160 MG tablet Take 160 mg by mouth daily.    . Vitamin D, Ergocalciferol, (DRISDOL) 50000 UNITS CAPS Take 50,000 Units by mouth every 7 (seven) days. Thursdays    . acetaminophen (TYLENOL) 325 MG tablet Take 325 mg by mouth every 6 (six) hours as needed for moderate pain or headache.    Marland Kitchen EPINEPHrine 0.3 mg/0.3 mL IJ SOAJ injection Inject 0.3 mg into the muscle as needed. (Anaphylaxis)  0  . flecainide (TAMBOCOR) 150 MG tablet Take 1 tablet (150 mg total) by mouth 2 (two) times daily. 2 tablet 0   No facility-administered medications prior to visit.     Allergies:   Macrodantin and Sulfa antibiotics   Social History   Social History  . Marital Status: Married    Spouse Name: N/A  . Number of Children: N/A  .  Years of Education: N/A   Social History Main Topics  . Smoking status: Never Smoker   . Smokeless tobacco: Never Used  . Alcohol Use: No  . Drug Use: No  . Sexual Activity: Not Asked   Other Topics Concern  . None   Social History Narrative   Pt lives in [place] with spouse.  Retired Control and instrumentation engineer (kindergarten)     Family History:  The patient's family history includes Diabetes in her father and mother; Hypertension in her father and mother; Kidney disease in her mother.   ROS:   Please see the history  of present illness.    ROS All other systems reviewed and are negative.   PHYSICAL EXAM:   VS:  BP 134/74 mmHg  Pulse 67  Ht 5\' 7"  (1.702 m)  Wt 80.287 kg (177 lb)  BMI 27.72 kg/m2   GEN: Well nourished, well developed, in no acute distress HEENT: normal Neck: no JVD, carotid bruits, or masses Cardiac: RRR; no murmurs, rubs, or gallops,no edema  Respiratory:  clear to auscultation bilaterally, normal work of breathing GI: soft, nontender, nondistended, + BS MS: no deformity or atrophy Skin: warm and dry, no rash Neuro:  Alert and Oriented x 3, Strength and sensation are intact Psych: euthymic mood, full affect  Wt Readings from Last 3 Encounters:  05/25/15 80.287 kg (177 lb)  02/16/15 80.332 kg (177 lb 1.6 oz)  06/30/14 78.2 kg (172 lb 6.4 oz)      Studies/Labs Reviewed:   EKG:  EKG is ordered today.  The ekg ordered today demonstrates normal sinus rhythm  Recent Labs: 02/12/2015: ALT 61*; BUN 6; Creatinine, Ser 0.91; Hemoglobin 13.4; Magnesium 1.9; Platelets 307; Potassium 3.8; Sodium 137   Lipid Panel    Component Value Date/Time   CHOL  11/05/2007 0821    136        ATP III CLASSIFICATION:  <200     mg/dL   Desirable  200-239  mg/dL   Borderline High  >=240    mg/dL   High   TRIG 145 11/05/2007 0821   HDL 36* 11/05/2007 0821   CHOLHDL 3.8 11/05/2007 0821   VLDL 29 11/05/2007 0821   LDLCALC  11/05/2007 0821    71        Total Cholesterol/HDL:CHD Risk Coronary Heart Disease Risk Table                     Men   Women  1/2 Average Risk   3.4   3.3     ASSESSMENT:    1. Paroxysmal atrial fibrillation (HCC)   2. Essential hypertension      PLAN:  In order of problems listed above:  Infrequent episodes of symptomatic atrial arrhythmia, well controlled symptomatically on a fairly high dose of beta blocker. At this point daily antiarrhythmic therapy does not appear to be necessary. Consider flecainide "pill-in-the-pocket" for longer events that last more  than several hours, which has not been the case since last November. Continue anticoagulation. CHADSVasc 2 (gender, HTN), will increase to 3 later this year.  Blood pressure is well controlled on current regimen.  Medication Adjustments/Labs and Tests Ordered: Current medicines are reviewed at length with the patient today.  Concerns regarding medicines are outlined above.  Medication changes, Labs and Tests ordered today are listed in the Patient Instructions below. There are no Patient Instructions on file for this visit.     Mikael Spray, MD  05/25/2015 2:41 PM  [facility] [address], [place], [place]  [address] Phone: [phone]; Fax: [phone]

## 2015-05-25 NOTE — Patient Instructions (Signed)
Dr. Croitoru recommends that you schedule a follow-up appointment in: ONE YEAR   

## 2015-05-31 ENCOUNTER — Telehealth: Payer: Self-pay | Admitting: Cardiovascular Disease

## 2015-05-31 NOTE — Telephone Encounter (Signed)
Pt notes hx of atrial fib and palpitations. Questions answered about flecainide "pill in the pocket" dose for sustained events.  Further questions answered about non-sustained, more brief events and considerations for this. Pt had noted an increase in these since appt last week, which she thinks are stress related.  She notes no symptoms other than increased freq of palpitations. These last a few minutes at a time. Could consider extra 1/2 tab of metoprolol if needed for bothersome palpitations more durative than 10-20 minutes, before doing flecainide. She understands use of flecainide was recommended if events sustained for several hours. Advised pt to check BP before taking metoprolol, if doing so, and note if effective. Advised to call if needed. Routed to Dr Sallyanne Kuster for further recommendations.

## 2015-05-31 NOTE — Telephone Encounter (Signed)
Please call,she have something going and she is concerned about it.

## 2015-05-31 NOTE — Telephone Encounter (Signed)
Recommendations communicated to patient.

## 2015-05-31 NOTE — Telephone Encounter (Signed)
I agree. I would not advise flecainide for the frequent but very brief spells that she is describing. I think the metoprolol is a better choice since it is longer lasting and would be best suited to help prevent emotional stress-related events like that. I would reserve treatment with flecainide "pill in the pocket" only for lengthy sustained episodes of atrial fibrillation.

## 2015-07-18 ENCOUNTER — Telehealth: Payer: Self-pay | Admitting: Cardiovascular Disease

## 2015-07-18 NOTE — Telephone Encounter (Signed)
Pt called in stating that she would like to speak with Dr.C about if she should be monitoring her Eliquis dosage or not. Please f/u with her

## 2015-07-18 NOTE — Telephone Encounter (Signed)
SPOKE TO PATIENT  INFORMED HER THAT NO LABS ARE NEEDED FOR DOSING HER ELIQUIS SHE HAD LABS DONE IN NOV 2016 UNLESS SHE HAS SYMPTOMS , CONTACT OFFICE. PATIENT VERBALIZED UNDERSTANDING.

## 2015-08-31 ENCOUNTER — Other Ambulatory Visit: Payer: Self-pay | Admitting: *Deleted

## 2015-08-31 MED ORDER — APIXABAN 5 MG PO TABS: ORAL_TABLET | ORAL | Status: DC

## 2015-09-12 ENCOUNTER — Telehealth: Payer: Self-pay | Admitting: Cardiovascular Disease

## 2015-09-12 NOTE — Telephone Encounter (Deleted)
error 

## 2015-11-23 DIAGNOSIS — N390 Urinary tract infection, site not specified: Secondary | ICD-10-CM | POA: Diagnosis not present

## 2015-11-23 DIAGNOSIS — E559 Vitamin D deficiency, unspecified: Secondary | ICD-10-CM | POA: Diagnosis not present

## 2015-11-23 DIAGNOSIS — E119 Type 2 diabetes mellitus without complications: Secondary | ICD-10-CM | POA: Diagnosis not present

## 2015-11-23 DIAGNOSIS — I10 Essential (primary) hypertension: Secondary | ICD-10-CM | POA: Diagnosis not present

## 2015-11-23 DIAGNOSIS — E784 Other hyperlipidemia: Secondary | ICD-10-CM | POA: Diagnosis not present

## 2015-11-23 DIAGNOSIS — R8299 Other abnormal findings in urine: Secondary | ICD-10-CM | POA: Diagnosis not present

## 2015-11-29 DIAGNOSIS — H6123 Impacted cerumen, bilateral: Secondary | ICD-10-CM | POA: Diagnosis not present

## 2015-11-29 DIAGNOSIS — J45998 Other asthma: Secondary | ICD-10-CM | POA: Diagnosis not present

## 2015-11-29 DIAGNOSIS — Z6827 Body mass index (BMI) 27.0-27.9, adult: Secondary | ICD-10-CM | POA: Diagnosis not present

## 2015-11-29 DIAGNOSIS — R350 Frequency of micturition: Secondary | ICD-10-CM | POA: Diagnosis not present

## 2015-11-29 DIAGNOSIS — I480 Paroxysmal atrial fibrillation: Secondary | ICD-10-CM | POA: Diagnosis not present

## 2015-11-29 DIAGNOSIS — L298 Other pruritus: Secondary | ICD-10-CM | POA: Diagnosis not present

## 2015-11-29 DIAGNOSIS — Z1389 Encounter for screening for other disorder: Secondary | ICD-10-CM | POA: Diagnosis not present

## 2015-11-29 DIAGNOSIS — E784 Other hyperlipidemia: Secondary | ICD-10-CM | POA: Diagnosis not present

## 2015-11-29 DIAGNOSIS — Z0000 Encounter for general adult medical examination without abnormal findings: Secondary | ICD-10-CM | POA: Diagnosis not present

## 2015-11-29 DIAGNOSIS — K760 Fatty (change of) liver, not elsewhere classified: Secondary | ICD-10-CM | POA: Diagnosis not present

## 2015-11-29 DIAGNOSIS — E119 Type 2 diabetes mellitus without complications: Secondary | ICD-10-CM | POA: Diagnosis not present

## 2015-11-29 DIAGNOSIS — D126 Benign neoplasm of colon, unspecified: Secondary | ICD-10-CM | POA: Diagnosis not present

## 2015-12-09 DIAGNOSIS — R8299 Other abnormal findings in urine: Secondary | ICD-10-CM | POA: Diagnosis not present

## 2015-12-09 DIAGNOSIS — R358 Other polyuria: Secondary | ICD-10-CM | POA: Diagnosis not present

## 2015-12-31 DIAGNOSIS — Z23 Encounter for immunization: Secondary | ICD-10-CM | POA: Diagnosis not present

## 2016-02-07 ENCOUNTER — Telehealth: Payer: Self-pay | Admitting: Cardiovascular Disease

## 2016-02-07 NOTE — Telephone Encounter (Signed)
Re: Pill in pocket for A Fib Also refer to documentation from 05/31/15  Spoke to patient, concerning her request. She noted she was not sure what to do with her 2 pills of flecainide that she's been holding on to. I reminded her of our discussion earlier in the year regarding "pill in pocket" and Dr. Victorino December recommendations. She states understanding & just needed reminder about instruction. She states she's not had any recent episodes of palpitations/breakthrough A Fib, noted no cause for concern.   Explains she had recently spoken with a friend who makes use of pill-in-pocket flecainide on a more routine basis & wondered if she should be carrying this with her when she is out. Advised not a bad idea to keep med on hand, but that if she hasn't needed it yet and is well controlled on current regimen, I would want her to call office in the event she has symptoms that she doesn't seek ED evaluation for.   She is aware the best use of this medication would be in the event of a sustained rapid rate.  Pt is advised to call should she have further inquiries, and all of her questions today were addressed to satisfaction. She voiced thanks for call.

## 2016-02-07 NOTE — Telephone Encounter (Signed)
New Message  Pt voiced wanting more information about the pocketpill for afib.  Pt voiced she does not know the name of it but needs more information about it.  Please f/u with pt

## 2016-02-07 NOTE — Telephone Encounter (Signed)
Thanks, agree with all your advice

## 2016-02-15 DIAGNOSIS — E119 Type 2 diabetes mellitus without complications: Secondary | ICD-10-CM | POA: Diagnosis not present

## 2016-02-15 DIAGNOSIS — E784 Other hyperlipidemia: Secondary | ICD-10-CM | POA: Diagnosis not present

## 2016-02-15 DIAGNOSIS — I10 Essential (primary) hypertension: Secondary | ICD-10-CM | POA: Diagnosis not present

## 2016-02-15 DIAGNOSIS — Z6827 Body mass index (BMI) 27.0-27.9, adult: Secondary | ICD-10-CM | POA: Diagnosis not present

## 2016-02-20 DIAGNOSIS — H40013 Open angle with borderline findings, low risk, bilateral: Secondary | ICD-10-CM | POA: Diagnosis not present

## 2016-03-12 DIAGNOSIS — N390 Urinary tract infection, site not specified: Secondary | ICD-10-CM | POA: Diagnosis not present

## 2016-04-11 DIAGNOSIS — L820 Inflamed seborrheic keratosis: Secondary | ICD-10-CM | POA: Diagnosis not present

## 2016-04-11 DIAGNOSIS — Z85828 Personal history of other malignant neoplasm of skin: Secondary | ICD-10-CM | POA: Diagnosis not present

## 2016-04-11 DIAGNOSIS — L821 Other seborrheic keratosis: Secondary | ICD-10-CM | POA: Diagnosis not present

## 2016-05-14 DIAGNOSIS — N390 Urinary tract infection, site not specified: Secondary | ICD-10-CM | POA: Diagnosis not present

## 2016-05-14 DIAGNOSIS — Z124 Encounter for screening for malignant neoplasm of cervix: Secondary | ICD-10-CM | POA: Diagnosis not present

## 2016-05-14 DIAGNOSIS — Z1231 Encounter for screening mammogram for malignant neoplasm of breast: Secondary | ICD-10-CM | POA: Diagnosis not present

## 2016-05-14 DIAGNOSIS — Z6826 Body mass index (BMI) 26.0-26.9, adult: Secondary | ICD-10-CM | POA: Diagnosis not present

## 2016-05-14 DIAGNOSIS — Z01419 Encounter for gynecological examination (general) (routine) without abnormal findings: Secondary | ICD-10-CM | POA: Diagnosis not present

## 2016-05-21 DIAGNOSIS — N3941 Urge incontinence: Secondary | ICD-10-CM | POA: Diagnosis not present

## 2016-05-21 DIAGNOSIS — R351 Nocturia: Secondary | ICD-10-CM | POA: Diagnosis not present

## 2016-05-21 DIAGNOSIS — N390 Urinary tract infection, site not specified: Secondary | ICD-10-CM | POA: Diagnosis not present

## 2016-05-21 DIAGNOSIS — R350 Frequency of micturition: Secondary | ICD-10-CM | POA: Diagnosis not present

## 2016-06-04 DIAGNOSIS — F418 Other specified anxiety disorders: Secondary | ICD-10-CM | POA: Diagnosis not present

## 2016-06-04 DIAGNOSIS — N390 Urinary tract infection, site not specified: Secondary | ICD-10-CM | POA: Diagnosis not present

## 2016-06-04 DIAGNOSIS — I480 Paroxysmal atrial fibrillation: Secondary | ICD-10-CM | POA: Diagnosis not present

## 2016-06-04 DIAGNOSIS — Z6825 Body mass index (BMI) 25.0-25.9, adult: Secondary | ICD-10-CM | POA: Diagnosis not present

## 2016-06-04 DIAGNOSIS — J302 Other seasonal allergic rhinitis: Secondary | ICD-10-CM | POA: Diagnosis not present

## 2016-06-04 DIAGNOSIS — E119 Type 2 diabetes mellitus without complications: Secondary | ICD-10-CM | POA: Diagnosis not present

## 2016-06-04 DIAGNOSIS — I10 Essential (primary) hypertension: Secondary | ICD-10-CM | POA: Diagnosis not present

## 2016-06-22 ENCOUNTER — Other Ambulatory Visit: Payer: Self-pay | Admitting: Cardiovascular Disease

## 2016-06-25 DIAGNOSIS — R351 Nocturia: Secondary | ICD-10-CM | POA: Diagnosis not present

## 2016-06-25 DIAGNOSIS — N3941 Urge incontinence: Secondary | ICD-10-CM | POA: Diagnosis not present

## 2016-06-25 DIAGNOSIS — N390 Urinary tract infection, site not specified: Secondary | ICD-10-CM | POA: Diagnosis not present

## 2016-07-18 DIAGNOSIS — N3000 Acute cystitis without hematuria: Secondary | ICD-10-CM | POA: Diagnosis not present

## 2016-07-18 DIAGNOSIS — R350 Frequency of micturition: Secondary | ICD-10-CM | POA: Diagnosis not present

## 2016-08-23 ENCOUNTER — Telehealth: Payer: Self-pay | Admitting: Cardiovascular Disease

## 2016-08-23 NOTE — Telephone Encounter (Signed)
Spoke with the patient. She stated that she had been having periods where she felt like she was going to black out. Her blood pressure has been running in the 90/60's with heart rate in the high 50's. Her PCP discontinued her Valsartan and decreased her Metoprolol to 25 mg bid. Per Dr. Sallyanne Kuster, discontinuing the valsartan was okay. The concern with decreasing the metoprolol was increased chance of palpitations. The patient was informed to keep the clinic updated with how she felt and to let the clinic know  if she experienced any palpitations. The patient verbalized her understanding.

## 2016-08-23 NOTE — Telephone Encounter (Signed)
New Message     Pt is having bouts of blacking out when she stands up, bp is low

## 2016-08-24 ENCOUNTER — Telehealth: Payer: Self-pay | Admitting: Cardiovascular Disease

## 2016-08-24 DIAGNOSIS — R350 Frequency of micturition: Secondary | ICD-10-CM | POA: Diagnosis not present

## 2016-08-24 DIAGNOSIS — N3000 Acute cystitis without hematuria: Secondary | ICD-10-CM | POA: Diagnosis not present

## 2016-08-24 NOTE — Telephone Encounter (Signed)
New message     Pt is calling about the change to her medication. She is to report how she feels on the medication. Pt phone dropped call as we were speaking. She wanted a call back from [employer]

## 2016-08-24 NOTE — Telephone Encounter (Signed)
Returned call to patient. She spoke with Lattie Haw, RN (see note below). Patient reports she has had a few palpitations today. She wants to know if she shouldn't cut "that much metoprolol out"  BP 143/87, 115/69, 97/67  Patient's metoprolol succinate was decreased from 50mg  BID to 25mg  BID by PCP. Advised may be appropriate to take extra 12.5mg  daily as needed for palpitations - check BP prior to taking. Advised would defer to MD.   She states she has also contacted PCP and Dr. Joylene Draft is out of the office but they would try to call her back. Informed her she should make PCP aware of her desire for med changes since he has recently make dose adjustments.   Patient aware she needs an appointment w/Dr. C - last visit 05/2015. Will have scheduler contact patient for an appointment.    Ricci Barker, RN    08/23/16 3:25 PM  Note    Spoke with the patient. She stated that she had been having periods where she felt like she was going to black out. Her blood pressure has been running in the 90/60's with heart rate in the high 50's. Her PCP discontinued her Valsartan and decreased her Metoprolol to 25 mg bid. Per Dr. Sallyanne Kuster, discontinuing the valsartan was okay. The concern with decreasing the metoprolol was increased chance of palpitations. The patient was informed to keep the clinic updated with how she felt and to let the clinic know  if she experienced any palpitations. The patient verbalized her understanding.

## 2016-08-25 NOTE — Telephone Encounter (Signed)
We also recommend weaning the metoprolol. OK to decrease to 25 mg once daily for 2-3 days, then stop. MCr

## 2016-08-28 NOTE — Telephone Encounter (Signed)
Returned call to patient. She is concerned why she should wean OFF metoprolol. She is worried about palpitations and now that valsartan is d/c'ed and this is the only medication she is on.   Will defer back to MD

## 2016-08-29 NOTE — Telephone Encounter (Signed)
If she does not have dizzy spells, she can stay on metoprolol succinate 25 mg twice daily. If the dizzy spells recur, then please reduce it to once daily only.

## 2016-08-29 NOTE — Telephone Encounter (Signed)
The patient called in with confusion about what dosage of metoprolol she should be taking. She is currently taking Metoprolol 25 mg bid. She was on Metoprolol 50 mg bid until her PCP had reduced it to Metoprolol 25 mg bid and her Valsartan was discontinued.  With the reduction, she has felt better and has not had the dizzy spells anymore. Unfortunetly she has had palpitations since the reduction. She would like to know if she should stay on the Metoprolol 25 mg bid or if she should have the Metoprolol increased again.   An appointment was made for August 16 with Dr. Sallyanne Kuster.

## 2016-08-29 NOTE — Telephone Encounter (Signed)
Decreasing the valsartan will not help with her slow heart rate. It only affects her blood pressure. I think there was also concern that her heart rate was too slow. What dose is she taking right now? A decent compromise might be for her to stay on metoprolol succinate 25 mg once daily.

## 2016-08-29 NOTE — Telephone Encounter (Signed)
New message     Pt is taking 50 mg metoprolol , 25 mg in morning 25 at night

## 2016-08-30 NOTE — Telephone Encounter (Signed)
Spoke with the patient to give her the instructions per Dr. Sallyanne Kuster:  If she does not have dizzy spells, she can stay on metoprolol succinate 25 mg twice daily. If the dizzy spells recur, then please reduce it to once daily only  Patient verbalized understanding.

## 2016-09-10 DIAGNOSIS — D1801 Hemangioma of skin and subcutaneous tissue: Secondary | ICD-10-CM | POA: Diagnosis not present

## 2016-09-10 DIAGNOSIS — Z85828 Personal history of other malignant neoplasm of skin: Secondary | ICD-10-CM | POA: Diagnosis not present

## 2016-09-10 DIAGNOSIS — D225 Melanocytic nevi of trunk: Secondary | ICD-10-CM | POA: Diagnosis not present

## 2016-09-10 DIAGNOSIS — D2261 Melanocytic nevi of right upper limb, including shoulder: Secondary | ICD-10-CM | POA: Diagnosis not present

## 2016-09-10 DIAGNOSIS — L812 Freckles: Secondary | ICD-10-CM | POA: Diagnosis not present

## 2016-09-10 DIAGNOSIS — D2262 Melanocytic nevi of left upper limb, including shoulder: Secondary | ICD-10-CM | POA: Diagnosis not present

## 2016-09-10 DIAGNOSIS — L821 Other seborrheic keratosis: Secondary | ICD-10-CM | POA: Diagnosis not present

## 2016-09-24 ENCOUNTER — Other Ambulatory Visit: Payer: Self-pay | Admitting: Cardiovascular Disease

## 2016-10-24 DIAGNOSIS — H2513 Age-related nuclear cataract, bilateral: Secondary | ICD-10-CM | POA: Diagnosis not present

## 2016-10-24 DIAGNOSIS — H25013 Cortical age-related cataract, bilateral: Secondary | ICD-10-CM | POA: Diagnosis not present

## 2016-10-24 DIAGNOSIS — H3562 Retinal hemorrhage, left eye: Secondary | ICD-10-CM | POA: Diagnosis not present

## 2016-10-24 DIAGNOSIS — H40013 Open angle with borderline findings, low risk, bilateral: Secondary | ICD-10-CM | POA: Diagnosis not present

## 2016-10-29 ENCOUNTER — Telehealth: Payer: Self-pay | Admitting: Cardiovascular Disease

## 2016-10-29 DIAGNOSIS — R002 Palpitations: Secondary | ICD-10-CM | POA: Diagnosis not present

## 2016-10-29 DIAGNOSIS — I480 Paroxysmal atrial fibrillation: Secondary | ICD-10-CM | POA: Diagnosis not present

## 2016-10-29 DIAGNOSIS — I10 Essential (primary) hypertension: Secondary | ICD-10-CM | POA: Diagnosis not present

## 2016-10-29 DIAGNOSIS — F419 Anxiety disorder, unspecified: Secondary | ICD-10-CM | POA: Diagnosis not present

## 2016-10-29 DIAGNOSIS — Z6824 Body mass index (BMI) 24.0-24.9, adult: Secondary | ICD-10-CM | POA: Diagnosis not present

## 2016-10-29 NOTE — Telephone Encounter (Signed)
During irregular rhythm, such as atrial fibrillation, the BP readings with an automatic cuff can be very erratic and erroneous. Please recheck BP when palpitations have abated ands let us know what it is. MCr

## 2016-10-29 NOTE — Telephone Encounter (Signed)
Patient c/o Palpitations:  High priority if patient c/o lightheadedness and shortness of breath.  1. How long have you been having palpitations? 2 hours  2. Are you currently experiencing lightheadedness and shortness of breath? no 3. Have you checked your BP and heart rate? (document readings) 154/90   Hr 60 4. Are you experiencing any other symptoms? bp high 154/90

## 2016-10-29 NOTE — Telephone Encounter (Signed)
Returned call to patient. She states earlier this morning she was experiencing palpitations that have subsided some, described as a "rumbling, strange" feeling in her chest. Denies any symptoms besides "palpitations" but yet when she takes her pulse she doesn't feel skipped beats. She states she got up and took BP and it was 150/105 and HR was 53. She states her BP is running 140/90 - 150/109 - she is unsure if her BP cuff is accurate. She states her HR is now 63-67bpm. Patient takes metoprolol succinate 50mg  QAM and 25mg  QHS. Patient has PAF. Last seen >1 year ago. Appointment 9/7 with MD. Advised patient would need to seek MD advice on what she should do with elevated BP/low HR apart from tracking her VS readings at home for trends.

## 2016-10-29 NOTE — Telephone Encounter (Signed)
Returned call to patient with MD advice & she voiced understanding She states she went to [facility] to check her BP and got the readings below: 133/79 HR 59 and recheck of HR 62

## 2016-11-04 ENCOUNTER — Other Ambulatory Visit: Payer: Self-pay | Admitting: Cardiovascular Disease

## 2016-11-05 NOTE — Telephone Encounter (Signed)
New message  Pt call to f/u up on medication authorization. Please call back to discuss

## 2016-11-15 ENCOUNTER — Ambulatory Visit: Payer: PRIVATE HEALTH INSURANCE | Admitting: Cardiovascular Disease

## 2016-12-05 ENCOUNTER — Ambulatory Visit (INDEPENDENT_AMBULATORY_CARE_PROVIDER_SITE_OTHER): Payer: PPO | Admitting: Cardiology

## 2016-12-05 ENCOUNTER — Encounter: Payer: Self-pay | Admitting: Cardiology

## 2016-12-05 ENCOUNTER — Other Ambulatory Visit: Payer: Self-pay | Admitting: Cardiovascular Disease

## 2016-12-05 VITALS — BP 162/90 | HR 65 | Ht 67.0 in | Wt 159.6 lb

## 2016-12-05 DIAGNOSIS — Z7901 Long term (current) use of anticoagulants: Secondary | ICD-10-CM | POA: Diagnosis not present

## 2016-12-05 DIAGNOSIS — I480 Paroxysmal atrial fibrillation: Secondary | ICD-10-CM

## 2016-12-05 DIAGNOSIS — K7581 Nonalcoholic steatohepatitis (NASH): Secondary | ICD-10-CM

## 2016-12-05 DIAGNOSIS — I10 Essential (primary) hypertension: Secondary | ICD-10-CM | POA: Diagnosis not present

## 2016-12-05 DIAGNOSIS — K7460 Unspecified cirrhosis of liver: Secondary | ICD-10-CM | POA: Diagnosis not present

## 2016-12-05 NOTE — Assessment & Plan Note (Signed)
CHADS VASC=3. On Eliquis 

## 2016-12-05 NOTE — Patient Instructions (Signed)
Medication Instructions: Your physician recommends that you continue on your current medications as directed. Please refer to the Current Medication list given to you today.  Eliquis 5 mg sample given.   Follow-Up: Your physician wants you to follow-up in: 1 year with Dr. Sallyanne Kuster. You will receive a reminder letter in the mail two months in advance. If you don't receive a letter, please call our office to schedule the follow-up appointment.  If you need a refill on your cardiac medications before your next appointment, please call your pharmacy.

## 2016-12-05 NOTE — Assessment & Plan Note (Signed)
H/O PAF, PAT. Currently NSR

## 2016-12-05 NOTE — Progress Notes (Signed)
12/05/2016 Amanda Frost   [birth year]  [id number]  Primary Physician Crist Infante, MD Primary Cardiologist: Dr Sallyanne Kuster  HPI:  Pleasant 66 y/o female followed y Dr Joylene Draft and Dr Sallyanne Kuster with a history of PAF and PAT. She had previously been on Flecainide but now has it only as a "pill in pocket" dose for sustained tachycardia. She is on Toprol now and takes an extra 1/2 if she has palpitations. She tells me she lost 30 when she found out she was borderline diabetic and since then he palpitations have significantly decreased. She admits she and her husband are under stress, they are now guardians of they're son's daughter.    Current Outpatient Prescriptions  Medication Sig Dispense Refill  . adapalene (DIFFERIN) 0.1 % cream Apply 1 application topically daily as needed (breakouts).    . Cholecalciferol (VITAMIN D3) 5000 units CAPS Take 1 capsule by mouth daily.    Marland Kitchen ELIQUIS 5 MG TABS tablet TAKE 1 TABLET BY MOUTH TWICE A DAY 60 tablet 2  . fexofenadine (ALLEGRA) 180 MG tablet Take 180 mg by mouth daily as needed for allergies or rhinitis.    . metoprolol succinate (TOPROL-XL) 50 MG 24 hr tablet Take 1 tablets by mouth in the morning and half tablet at night.Take with or immediately following a meal.    . trimethoprim (TRIMPEX) 100 MG tablet Take 1 tablet by mouth daily.     No current facility-administered medications for this visit.     Allergies  Allergen Reactions  . Macrodantin [Nitrofurantoin Macrocrystal] Anaphylaxis  . Sulfa Antibiotics Other (See Comments)    Unsure, childhood reaction    Past Medical History:  Diagnosis Date  . Hypertension   . Liver cirrhosis secondary to NASH (nonalcoholic steatohepatitis) ([place])   . Ocular migraine   . Palpitations    PAC's,PVC's,atrial tachycardia  . Paroxysmal atrial fibrillation [facility])     Social History   Social History  . Marital status: Married    Spouse name: N/A  . Number of children: N/A  . Years of education:  N/A   Occupational History  . Not on file.   Social History Main Topics  . Smoking status: Never Smoker  . Smokeless tobacco: Never Used  . Alcohol use No  . Drug use: No  . Sexual activity: Not on file   Other Topics Concern  . Not on file   Social History Narrative   Pt lives in [place] with spouse.  Retired Control and instrumentation engineer (kindergarten)     Family History  Problem Relation Age of Onset  . Diabetes Mother   . Hypertension Mother   . Kidney disease Mother   . Diabetes Father   . Hypertension Father      Review of Systems: General: negative for chills, fever, night sweats or weight changes.  Cardiovascular: negative for chest pain, dyspnea on exertion, edema, orthopnea, palpitations, paroxysmal nocturnal dyspnea or shortness of breath Dermatological: negative for rash Respiratory: negative for cough or wheezing Urologic: negative for hematuria Abdominal: negative for nausea, vomiting, diarrhea, bright red blood per rectum, melena, or hematemesis Neurologic: negative for visual changes, syncope, or dizziness All other systems reviewed and are otherwise negative except as noted above.    Blood pressure (!) 162/90, pulse 65, height 5\' 7"  (1.702 m), weight 159 lb 9.6 oz (72.4 kg).  General appearance: alert, cooperative, no distress and slihtly anxiuous Neck: no carotid bruit and no JVD Lungs: clear to auscultation bilaterally Heart: regular rate and rhythm Extremities:  extremities normal, atraumatic, no cyanosis or edema Skin: Skin color, texture, turgor normal. No rashes or lesions Neurologic: Grossly normal  EKG NSR 65  ASSESSMENT AND PLAN:   Paroxysmal atrial fibrillation H/O PAF, PAT. Currently NSR  Chronic anticoagulation CHADS VASC=3. On Eliquis  Liver cirrhosis secondary to NASH .  PLAN: Same cardiac Rx. F/U Dr Sallyanne Kuster in one year. Her B/P was elevated in the office but she tells me it runs 120 at home.   Kerin Ransom PA-C 12/05/2016 3:52  PM

## 2016-12-07 ENCOUNTER — Ambulatory Visit: Payer: PRIVATE HEALTH INSURANCE | Admitting: Cardiovascular Disease

## 2016-12-19 ENCOUNTER — Other Ambulatory Visit: Payer: Self-pay | Admitting: Cardiovascular Disease

## 2016-12-25 ENCOUNTER — Other Ambulatory Visit: Payer: Self-pay | Admitting: Cardiovascular Disease

## 2016-12-25 NOTE — Telephone Encounter (Signed)
Need recent BMET and CBC

## 2016-12-28 DIAGNOSIS — N3941 Urge incontinence: Secondary | ICD-10-CM | POA: Diagnosis not present

## 2016-12-28 DIAGNOSIS — N3000 Acute cystitis without hematuria: Secondary | ICD-10-CM | POA: Diagnosis not present

## 2017-01-07 DIAGNOSIS — J328 Other chronic sinusitis: Secondary | ICD-10-CM | POA: Diagnosis not present

## 2017-01-07 DIAGNOSIS — Z6824 Body mass index (BMI) 24.0-24.9, adult: Secondary | ICD-10-CM | POA: Diagnosis not present

## 2017-01-07 DIAGNOSIS — R05 Cough: Secondary | ICD-10-CM | POA: Diagnosis not present

## 2017-01-07 DIAGNOSIS — J45998 Other asthma: Secondary | ICD-10-CM | POA: Diagnosis not present

## 2017-01-07 DIAGNOSIS — N390 Urinary tract infection, site not specified: Secondary | ICD-10-CM | POA: Diagnosis not present

## 2017-01-09 DIAGNOSIS — I10 Essential (primary) hypertension: Secondary | ICD-10-CM | POA: Diagnosis not present

## 2017-01-09 DIAGNOSIS — E559 Vitamin D deficiency, unspecified: Secondary | ICD-10-CM | POA: Diagnosis not present

## 2017-01-09 DIAGNOSIS — E119 Type 2 diabetes mellitus without complications: Secondary | ICD-10-CM | POA: Diagnosis not present

## 2017-01-09 DIAGNOSIS — Z0000 Encounter for general adult medical examination without abnormal findings: Secondary | ICD-10-CM | POA: Diagnosis not present

## 2017-01-09 DIAGNOSIS — E785 Hyperlipidemia, unspecified: Secondary | ICD-10-CM | POA: Diagnosis not present

## 2017-01-16 DIAGNOSIS — J328 Other chronic sinusitis: Secondary | ICD-10-CM | POA: Diagnosis not present

## 2017-01-16 DIAGNOSIS — M859 Disorder of bone density and structure, unspecified: Secondary | ICD-10-CM | POA: Diagnosis not present

## 2017-01-16 DIAGNOSIS — Z0000 Encounter for general adult medical examination without abnormal findings: Secondary | ICD-10-CM | POA: Diagnosis not present

## 2017-01-16 DIAGNOSIS — H8113 Benign paroxysmal vertigo, bilateral: Secondary | ICD-10-CM | POA: Diagnosis not present

## 2017-01-16 DIAGNOSIS — Z1389 Encounter for screening for other disorder: Secondary | ICD-10-CM | POA: Diagnosis not present

## 2017-01-16 DIAGNOSIS — I480 Paroxysmal atrial fibrillation: Secondary | ICD-10-CM | POA: Diagnosis not present

## 2017-01-16 DIAGNOSIS — N390 Urinary tract infection, site not specified: Secondary | ICD-10-CM | POA: Diagnosis not present

## 2017-01-16 DIAGNOSIS — K760 Fatty (change of) liver, not elsewhere classified: Secondary | ICD-10-CM | POA: Diagnosis not present

## 2017-01-16 DIAGNOSIS — E119 Type 2 diabetes mellitus without complications: Secondary | ICD-10-CM | POA: Diagnosis not present

## 2017-01-16 DIAGNOSIS — J45998 Other asthma: Secondary | ICD-10-CM | POA: Diagnosis not present

## 2017-01-16 DIAGNOSIS — E7849 Other hyperlipidemia: Secondary | ICD-10-CM | POA: Diagnosis not present

## 2017-01-16 DIAGNOSIS — Z6823 Body mass index (BMI) 23.0-23.9, adult: Secondary | ICD-10-CM | POA: Diagnosis not present

## 2017-02-11 DIAGNOSIS — M859 Disorder of bone density and structure, unspecified: Secondary | ICD-10-CM | POA: Diagnosis not present

## 2017-03-15 DIAGNOSIS — S76012A Strain of muscle, fascia and tendon of left hip, initial encounter: Secondary | ICD-10-CM | POA: Diagnosis not present

## 2017-04-11 ENCOUNTER — Other Ambulatory Visit: Payer: Self-pay | Admitting: Cardiovascular Disease

## 2017-04-25 ENCOUNTER — Other Ambulatory Visit: Payer: Self-pay

## 2017-04-25 MED ORDER — METOPROLOL SUCCINATE ER 50 MG PO TB24: ORAL_TABLET | ORAL | 0 refills | Status: DC

## 2017-05-06 ENCOUNTER — Emergency Department (HOSPITAL_COMMUNITY): Payer: PPO

## 2017-05-06 ENCOUNTER — Telehealth: Payer: Self-pay | Admitting: Cardiology

## 2017-05-06 ENCOUNTER — Encounter (HOSPITAL_COMMUNITY): Payer: Self-pay | Admitting: Emergency Medicine

## 2017-05-06 DIAGNOSIS — S0993XA Unspecified injury of face, initial encounter: Secondary | ICD-10-CM | POA: Diagnosis not present

## 2017-05-06 DIAGNOSIS — S0990XA Unspecified injury of head, initial encounter: Secondary | ICD-10-CM | POA: Diagnosis not present

## 2017-05-06 DIAGNOSIS — R51 Headache: Secondary | ICD-10-CM | POA: Diagnosis not present

## 2017-05-06 DIAGNOSIS — Z5321 Procedure and treatment not carried out due to patient leaving prior to being seen by health care provider: Secondary | ICD-10-CM | POA: Insufficient documentation

## 2017-05-06 DIAGNOSIS — J3489 Other specified disorders of nose and nasal sinuses: Secondary | ICD-10-CM | POA: Insufficient documentation

## 2017-05-06 NOTE — Telephone Encounter (Signed)
Spoke with patient and she stated she was getting in her car and hit top of her head so hard that over 30 minutes ago and she still has pain in her nose and cheekbones. She did have visual changes when it happened. She said multiple times that she hit her head really to the point she saw "stars". She was wanting to know if she should get it checked out... Recommended she should get checked out today to make sure there is no bleeding since she is on Eliquis, gave her name of [place]. She stated she would go for evaluation.

## 2017-05-06 NOTE — Telephone Encounter (Signed)
New Message   Pt states she is on Eliquis and bumped her head really hard, she has blurry vision, seeing stars, she not bleeding but she is really sore. Please call

## 2017-05-06 NOTE — ED Triage Notes (Signed)
Pt reports she hit the top of her head in the car an hour ago. No LOC. No edema. Pt reports when she hit her head she began to have pain around her nose and her nose ran.  Pt is on eliquis and wanted to be checked out.

## 2017-05-06 NOTE — ED Notes (Signed)
Patient approached me in waiting room saying her Dr sent her over 3 hours ago "to see if I have a head bleed. I am on blood thinners and hit my head".  Pt c/o facial pain behind cheeks and eyes and said she had nausea when it occurred but not now.  Dr. Alvino Chapel made aware, CT orders placed. Pt alert & oriented x3, denies any nausea or vomiting at this time.No rooms open at this time.

## 2017-05-07 ENCOUNTER — Emergency Department (HOSPITAL_COMMUNITY)
Admission: EM | Admit: 2017-05-07 | Discharge: 2017-05-07 | Disposition: A | Discharge status: Home / Self Care | Payer: PPO | Attending: Emergency Medicine | Admitting: Emergency Medicine

## 2017-05-07 NOTE — ED Notes (Signed)
Pt sts she will just follow up with her PCP in the morning.

## 2017-05-18 ENCOUNTER — Ambulatory Visit (HOSPITAL_COMMUNITY)
Admission: EM | Admit: 2017-05-18 | Discharge: 2017-05-18 | Disposition: A | Discharge status: Home / Self Care | Payer: PPO | Attending: Family Medicine | Admitting: Family Medicine

## 2017-05-18 ENCOUNTER — Encounter (HOSPITAL_COMMUNITY): Payer: Self-pay | Admitting: Family Medicine

## 2017-05-18 DIAGNOSIS — H1132 Conjunctival hemorrhage, left eye: Secondary | ICD-10-CM

## 2017-05-18 NOTE — ED Provider Notes (Signed)
  [facility]   [id number] 05/18/17 Arrival Time: 9924  ASSESSMENT & PLAN:  1. Subconjunctival hemorrhage of left eye    Reassured. May f/u as needed.  Reviewed expectations re: course of current medical issues. Questions answered. Outlined signs and symptoms indicating need for more acute intervention. Patient verbalized understanding. After Visit Summary given.   SUBJECTIVE:  Amanda Frost is a 67 y.o. female who presents with complaint of "looking in the mirror this morning and noticed there was blood in my eye". Left eye normal yesterday. No eye pain or itching. Injury: no. Visual changes: no. Contact lens use: no. Self treatment: none. No recent illnesses. No h/o similar. No specific aggravating or alleviating factors reported.  ROS: As per HPI.  OBJECTIVE:  Vitals:   05/18/17 1811  BP: (!) 156/71  Pulse: 70  Resp: 18  Temp: 98.5 F (36.9 C)  SpO2: 98%    General appearance: alert; no distress Eyes: OS with medial subconjunctival hemorrhage; PERRLA; EOMI Neck: supple Lungs: clear to auscultation bilaterally Heart: regular rate and rhythm Skin: warm and dry Psychological: alert and cooperative; normal mood and affect  Allergies  Allergen Reactions  . Macrodantin [Nitrofurantoin Macrocrystal] Anaphylaxis  . Sulfa Antibiotics Other (See Comments)    Unsure, childhood reaction    Past Medical History:  Diagnosis Date  . Hypertension   . Liver cirrhosis secondary to NASH (nonalcoholic steatohepatitis) ([place])   . Ocular migraine   . Palpitations    PAC's,PVC's,atrial tachycardia  . Paroxysmal atrial fibrillation ([facility])    Social History   Socioeconomic History  . Marital status: Married    Spouse name: Not on file  . Number of children: Not on file  . Years of education: Not on file  . Highest education level: Not on file  Social Needs  . Financial resource strain: Not on file  . Food insecurity - worry: Not on file  . Food  insecurity - inability: Not on file  . Transportation needs - medical: Not on file  . Transportation needs - non-medical: Not on file  Occupational History  . Not on file  Tobacco Use  . Smoking status: Never Smoker  . Smokeless tobacco: Never Used  Substance and Sexual Activity  . Alcohol use: No    Alcohol/week: 0.0 oz  . Drug use: No  . Sexual activity: Not on file  Other Topics Concern  . Not on file  Social History Narrative   Pt lives in [place] with spouse.  Retired Control and instrumentation engineer (kindergarten)   Family History  Problem Relation Age of Onset  . Diabetes Mother   . Hypertension Mother   . Kidney disease Mother   . Diabetes Father   . Hypertension Father    Past Surgical History:  Procedure Laterality Date  . BREAST BIOPSY  1994  . CARDIAC CATHETERIZATION  11/05/2007   normal LV fx,patent coronary arteries  . NM MYOVIEW LTD  03/12/2005   normal  . US ECHOCARDIOGRAPHY  11/26/2007   mild LVH,trace MR,TR     Vanessa Kick, MD 05/22/17 1014

## 2017-05-18 NOTE — ED Triage Notes (Signed)
Pt here for bleeding to left eye. She noticed it earlier wen she was visiting her mom. Denies any pain in the eye or vision disturbances. Reports they took her BP at the nursing home and it was 190. After she calmed down it was normal.

## 2017-05-20 ENCOUNTER — Telehealth: Payer: Self-pay | Admitting: Cardiovascular Disease

## 2017-05-20 NOTE — Telephone Encounter (Signed)
New message    Patient calling with concerns of broken blood vessel in eye, bleeding gums, blood in stool. Please call  Pt c/o medication issue:  1. Name of Medication:ELIQUIS 5 MG TABS tablet  2. How are you currently taking this medication (dosage and times per day)? As prescribed 3. Are you having a reaction (difficulty breathing--STAT)? No  4. What is your medication issue? Patient wants to know if Eliquis is causing these problems

## 2017-05-20 NOTE — Telephone Encounter (Addendum)
Spoke with pt, reassurance given to the patient, she will try moisture eye drops, follow up with the dentist and also try stool softner to help with her hemorrhoids. Questions regarding eliquis and the reason for taking discussed. She will call back with concerns.

## 2017-08-05 ENCOUNTER — Other Ambulatory Visit: Payer: Self-pay | Admitting: Cardiovascular Disease

## 2017-08-09 ENCOUNTER — Other Ambulatory Visit: Payer: Self-pay | Admitting: Cardiovascular Disease

## 2017-08-09 NOTE — Telephone Encounter (Signed)
Rx(s) sent to pharmacy electronically.  

## 2017-08-16 DIAGNOSIS — Z0000 Encounter for general adult medical examination without abnormal findings: Secondary | ICD-10-CM | POA: Diagnosis not present

## 2017-09-17 ENCOUNTER — Telehealth: Payer: Self-pay | Admitting: Cardiovascular Disease

## 2017-09-17 NOTE — Telephone Encounter (Signed)
The patient should discuss this with the vein specialist that would perform the procedure to see if the eliquis needed to be held for the procedure. They would send [place] a clearance request.  Since she has been maintaining sinus rhythm it would probably be fine to hold the Eliquis if necessary but we would go through our pharmacist for current recommendations.  Also she has not been seen since 12/2016. We would probably like to see her in the office before clearing her for surgery.

## 2017-09-17 NOTE — Telephone Encounter (Signed)
New message    Patient wants to have spider veins removed , is this a procedure that you would recommend her having since she has afib and is on eliquis

## 2017-09-18 NOTE — Telephone Encounter (Signed)
Spoke with pt. She is scheduled to see Dr.C on 11/22/17 and will discuss with the doctor rather or not its okay for her to have her spider veins removed while taking eliquis and having a-fib

## 2017-09-24 ENCOUNTER — Telehealth: Payer: Self-pay | Admitting: Cardiovascular Disease

## 2017-09-24 NOTE — Telephone Encounter (Signed)
This same sensation happened 4 yrs ago and was related to extra caffeine.HR is 65 bpm and she admits to having sweet tea recently. Encourage her to avoid caffeine and sugar and continue to monitor HR.She agrees with plan

## 2017-09-24 NOTE — Telephone Encounter (Signed)
New message    Pt states that she has a vibrating type feeling in her chest. She said she has Afib and knows what that feels like but it's not like that. She said it's been going on for about a week for 2-3 times a day. She wants to know if she needs to be seen sooner than scheduled appt.

## 2017-09-25 ENCOUNTER — Telehealth: Payer: Self-pay | Admitting: Cardiovascular Disease

## 2017-09-25 NOTE — Telephone Encounter (Signed)
Follow up    Patient made an appointment for 07/16 for her vibration.

## 2017-09-25 NOTE — Telephone Encounter (Signed)
Pt called to report that she is experiencing a shocking type feeling over her left side for 1 week. She says it lasts for just a second but happens 10-15 times a day. She has some sob and dizziness with it. She says that this happened back in 05/2015 and Lurena Joiner spoke with her re: taking an added metoprolol and that it helped back then. She has checked her pulse recently and it is usually in the 60's but it is she feels pauses. It has also been happening more frequently. She made a sooner appt to be seen with Kerin Ransom 10/15/17 but was wondering if he has any recommendations prior to that visit for some relief such as adjusting her beta blocker or to wear a monitor possibly prior to her visit. Advised her that I will forward message to him and if there is any new recommendations we will let her know. If not, if anything changes or worsens prior to her appt to let us know or she can also go to the ER so they can put her on monitor to see what may be going on. Pt agreed to plan.

## 2017-09-25 NOTE — Telephone Encounter (Signed)
New Message   Patient is calling back about the vibrating feeling she has in her chest. She says that the sensation occurs once or twice an hour. She says that her heart has a fierce vibration. She wants to see about being seen sooner. Please call.

## 2017-09-25 NOTE — Telephone Encounter (Signed)
She needs to come in for an EKG. Have her come tomorrow and I'll review it.  Kerin Ransom PA-C 09/25/2017 4:23 PM

## 2017-09-25 NOTE — Telephone Encounter (Signed)
Pt agrees to come in tomorrow for EKG per Kerin Ransom PA.

## 2017-09-26 ENCOUNTER — Ambulatory Visit (INDEPENDENT_AMBULATORY_CARE_PROVIDER_SITE_OTHER): Payer: PPO | Admitting: *Deleted

## 2017-09-26 ENCOUNTER — Ambulatory Visit: Payer: PPO

## 2017-09-26 DIAGNOSIS — R42 Dizziness and giddiness: Secondary | ICD-10-CM

## 2017-09-26 DIAGNOSIS — I87393 Chronic venous hypertension (idiopathic) with other complications of bilateral lower extremity: Secondary | ICD-10-CM | POA: Diagnosis not present

## 2017-09-26 DIAGNOSIS — R600 Localized edema: Secondary | ICD-10-CM | POA: Diagnosis not present

## 2017-09-26 DIAGNOSIS — R0602 Shortness of breath: Secondary | ICD-10-CM | POA: Diagnosis not present

## 2017-09-26 NOTE — Progress Notes (Signed)
Pt came in for EKG per Kerin Ransom PA for complaints of sob, dizziness, and a shocking type feeling to left side pt continues to experience today but sensation isn't as strong as previous./cy

## 2017-09-30 ENCOUNTER — Other Ambulatory Visit: Payer: Self-pay | Admitting: Cardiovascular Disease

## 2017-09-30 NOTE — Telephone Encounter (Signed)
Rx(s) sent to pharmacy electronically.  

## 2017-10-15 ENCOUNTER — Encounter: Payer: Self-pay | Admitting: Cardiology

## 2017-10-15 ENCOUNTER — Ambulatory Visit (INDEPENDENT_AMBULATORY_CARE_PROVIDER_SITE_OTHER): Payer: PPO | Admitting: Cardiology

## 2017-10-15 VITALS — BP 146/90 | HR 66 | Ht 67.0 in | Wt 165.2 lb

## 2017-10-15 DIAGNOSIS — Z7901 Long term (current) use of anticoagulants: Secondary | ICD-10-CM

## 2017-10-15 DIAGNOSIS — R002 Palpitations: Secondary | ICD-10-CM | POA: Diagnosis not present

## 2017-10-15 DIAGNOSIS — I10 Essential (primary) hypertension: Secondary | ICD-10-CM

## 2017-10-15 DIAGNOSIS — I480 Paroxysmal atrial fibrillation: Secondary | ICD-10-CM

## 2017-10-15 NOTE — Assessment & Plan Note (Signed)
CHADS VASC=3. On Eliquis

## 2017-10-15 NOTE — Progress Notes (Signed)
10/15/2017 Amanda Frost   12-03-1950  [id number]  Primary Physician Crist Infante, MD Primary Cardiologist: Dr Sallyanne Kuster  HPI:  Pleasant 67 y/o female with a history of PAF and PAT in the past. She is on Eliquis and Toprol and has done well from this standpoint. She has been troubled recently by a "vibration" in her chest. She says it does not feel like her arrhythmia. She came in for an EKG 09/25/17 and was in NSR. Today she says she was having the sensation during her EKG which showed NSR- no ectopy. She also had the sensation while I was examining her- no irregular rhythm or muscle twitching noted on exam. She denies chest pain or SOB. Her symptoms are not reproducible by movement or deep inspiration.    Current Outpatient Medications  Medication Sig Dispense Refill  . adapalene (DIFFERIN) 0.1 % cream Apply 1 application topically daily as needed (breakouts).    . Cholecalciferol (VITAMIN D3) 5000 units CAPS Take 2 capsules by mouth daily. Monday - Friday    . ELIQUIS 5 MG TABS tablet TAKE ONE TABLET BY MOUTH TWICE DAILY  180 tablet 1  . fexofenadine (ALLEGRA) 180 MG tablet Take 180 mg by mouth daily as needed for allergies or rhinitis.    . metoprolol succinate (TOPROL-XL) 50 MG 24 hr tablet Take 50 mg in the morning and 25 mg in the evening, Take with or immediately following a meal.    . trimethoprim (TRIMPEX) 100 MG tablet Take 1 tablet by mouth daily.     No current facility-administered medications for this visit.     Allergies  Allergen Reactions  . Macrodantin [Nitrofurantoin Macrocrystal] Hives  . Ciprofloxacin Other (See Comments)    AFIB  . Sulfa Antibiotics Other (See Comments) and Hives    Unsure, childhood reaction Unsure, childhood reaction    Past Medical History:  Diagnosis Date  . Hypertension   . Liver cirrhosis secondary to NASH (nonalcoholic steatohepatitis) ([place])   . Ocular migraine   . Palpitations    PAC's,PVC's,atrial tachycardia  .  Paroxysmal atrial fibrillation (HCC)     Social History   Socioeconomic History  . Marital status: Married    Spouse name: Not on file  . Number of children: Not on file  . Years of education: Not on file  . Highest education level: Not on file  Occupational History  . Not on file  Social Needs  . Financial resource strain: Not on file  . Food insecurity:    Worry: Not on file    Inability: Not on file  . Transportation needs:    Medical: Not on file    Non-medical: Not on file  Tobacco Use  . Smoking status: Never Smoker  . Smokeless tobacco: Never Used  Substance and Sexual Activity  . Alcohol use: No    Alcohol/week: 0.0 oz  . Drug use: No  . Sexual activity: Not on file  Lifestyle  . Physical activity:    Days per week: Not on file    Minutes per session: Not on file  . Stress: Not on file  Relationships  . Social connections:    Talks on phone: Not on file    Gets together: Not on file    Attends religious service: Not on file    Active member of club or organization: Not on file    Attends meetings of clubs or organizations: Not on file    Relationship status: Not on file  .  Intimate partner violence:    Fear of current or ex partner: Not on file    Emotionally abused: Not on file    Physically abused: Not on file    Forced sexual activity: Not on file  Other Topics Concern  . Not on file  Social History Narrative   Pt lives in [place] with spouse.  Retired Control and instrumentation engineer (kindergarten)     Family History  Problem Relation Age of Onset  . Diabetes Mother   . Hypertension Mother   . Kidney disease Mother   . Diabetes Father   . Hypertension Father      Review of Systems: General: negative for chills, fever, night sweats or weight changes.  Cardiovascular: negative for chest pain, dyspnea on exertion, edema, orthopnea, palpitations, paroxysmal nocturnal dyspnea or shortness of breath Dermatological: negative for rash Respiratory: negative  for cough or wheezing Urologic: negative for hematuria Abdominal: negative for nausea, vomiting, diarrhea, bright red blood per rectum, melena, or hematemesis Neurologic: negative for visual changes, syncope, or dizziness All other systems reviewed and are otherwise negative except as noted above.    Blood pressure (!) 146/90, pulse 66, height 5\' 7"  (1.702 m), weight 165 lb 3.2 oz (74.9 kg).  General appearance: alert, cooperative and no distress Neck: no carotid bruit and no JVD Lungs: clear to auscultation bilaterally Heart: regular rate and rhythm Extremities: extremities normal, atraumatic, no cyanosis or edema Skin: Skin color, texture, turgor normal. No rashes or lesions Neurologic: Grossly normal  EKG NSR-HR 66 QTc 406  ASSESSMENT AND PLAN:   Palpitations Actually more of a "vibration" sensation- no arrhythmia documented  Paroxysmal atrial fibrillation NSR on Toprol  Essential hypertension Office HTN  Chronic anticoagulation CHADS VASC=3. On Eliquis   PLAN  Reassurance. I offered a monitor but she tells me they are going to [place] to babysit grandchildren for two weeks. If she continues to have problems she'll contact when she gets back. She has an apt to see dr Sallyanne Kuster in Aug. I suggested she avoid caffeine, she drinks tea daily.   Kerin Ransom PA-C 10/15/2017 8:39 AM

## 2017-10-15 NOTE — Assessment & Plan Note (Signed)
Actually more of a "vibration" sensation- no arrhythmia documented

## 2017-10-15 NOTE — Patient Instructions (Addendum)
Medication Instructions:  Your physician recommends that you continue on your current medications as directed. Please refer to the Current Medication list given to you today.  Follow-Up: August with Dr. Sallyanne Kuster as scheduled  Any Other Special Instructions Will Be Listed Below (If Applicable).  Please call when you return from your trip if you continue to have symptoms   If you need a refill on your cardiac medications before your next appointment, please call your pharmacy.

## 2017-10-15 NOTE — Assessment & Plan Note (Signed)
Office HTN

## 2017-10-15 NOTE — Assessment & Plan Note (Signed)
NSR on Toprol

## 2017-11-07 DIAGNOSIS — Z952 Presence of prosthetic heart valve: Secondary | ICD-10-CM | POA: Diagnosis not present

## 2017-11-07 DIAGNOSIS — M791 Myalgia, unspecified site: Secondary | ICD-10-CM | POA: Diagnosis not present

## 2017-11-07 DIAGNOSIS — I10 Essential (primary) hypertension: Secondary | ICD-10-CM | POA: Diagnosis not present

## 2017-11-07 DIAGNOSIS — Z1231 Encounter for screening mammogram for malignant neoplasm of breast: Secondary | ICD-10-CM | POA: Diagnosis not present

## 2017-11-07 DIAGNOSIS — I480 Paroxysmal atrial fibrillation: Secondary | ICD-10-CM | POA: Diagnosis not present

## 2017-11-07 DIAGNOSIS — W57XXXA Bitten or stung by nonvenomous insect and other nonvenomous arthropods, initial encounter: Secondary | ICD-10-CM | POA: Diagnosis not present

## 2017-11-07 DIAGNOSIS — E119 Type 2 diabetes mellitus without complications: Secondary | ICD-10-CM | POA: Diagnosis not present

## 2017-11-07 DIAGNOSIS — Z01419 Encounter for gynecological examination (general) (routine) without abnormal findings: Secondary | ICD-10-CM | POA: Diagnosis not present

## 2017-11-07 DIAGNOSIS — E559 Vitamin D deficiency, unspecified: Secondary | ICD-10-CM | POA: Diagnosis not present

## 2017-11-07 DIAGNOSIS — R351 Nocturia: Secondary | ICD-10-CM | POA: Diagnosis not present

## 2017-11-07 DIAGNOSIS — Z6825 Body mass index (BMI) 25.0-25.9, adult: Secondary | ICD-10-CM | POA: Diagnosis not present

## 2017-11-11 ENCOUNTER — Other Ambulatory Visit: Payer: Self-pay | Admitting: Obstetrics and Gynecology

## 2017-11-11 DIAGNOSIS — I87393 Chronic venous hypertension (idiopathic) with other complications of bilateral lower extremity: Secondary | ICD-10-CM | POA: Diagnosis not present

## 2017-11-11 DIAGNOSIS — R921 Mammographic calcification found on diagnostic imaging of breast: Secondary | ICD-10-CM

## 2017-11-12 ENCOUNTER — Ambulatory Visit
Admission: RE | Admit: 2017-11-12 | Discharge: 2017-11-12 | Disposition: A | Discharge status: Home / Self Care | Payer: PPO | Source: Ambulatory Visit | Attending: Obstetrics and Gynecology | Admitting: Obstetrics and Gynecology

## 2017-11-12 ENCOUNTER — Other Ambulatory Visit: Payer: Self-pay | Admitting: Obstetrics and Gynecology

## 2017-11-12 DIAGNOSIS — R921 Mammographic calcification found on diagnostic imaging of breast: Secondary | ICD-10-CM

## 2017-11-22 ENCOUNTER — Encounter: Payer: Self-pay | Admitting: Cardiovascular Disease

## 2017-11-22 ENCOUNTER — Emergency Department (HOSPITAL_COMMUNITY)
Admission: EM | Admit: 2017-11-22 | Discharge: 2017-11-22 | Disposition: A | Discharge status: Home / Self Care | Payer: PPO | Attending: Emergency Medicine | Admitting: Emergency Medicine

## 2017-11-22 ENCOUNTER — Other Ambulatory Visit: Payer: Self-pay

## 2017-11-22 ENCOUNTER — Encounter (HOSPITAL_COMMUNITY): Payer: Self-pay | Admitting: Emergency Medicine

## 2017-11-22 ENCOUNTER — Ambulatory Visit: Payer: PPO | Admitting: Cardiovascular Disease

## 2017-11-22 VITALS — BP 142/73 | HR 70 | Ht 68.0 in | Wt 165.0 lb

## 2017-11-22 DIAGNOSIS — Z7901 Long term (current) use of anticoagulants: Secondary | ICD-10-CM

## 2017-11-22 DIAGNOSIS — I480 Paroxysmal atrial fibrillation: Secondary | ICD-10-CM | POA: Diagnosis not present

## 2017-11-22 DIAGNOSIS — R000 Tachycardia, unspecified: Secondary | ICD-10-CM | POA: Diagnosis present

## 2017-11-22 DIAGNOSIS — I10 Essential (primary) hypertension: Secondary | ICD-10-CM

## 2017-11-22 DIAGNOSIS — I4891 Unspecified atrial fibrillation: Secondary | ICD-10-CM | POA: Insufficient documentation

## 2017-11-22 LAB — CBC
HEMATOCRIT: 41 % (ref 36.0–46.0)
HEMOGLOBIN: 13.1 g/dL (ref 12.0–15.0)
MCH: 30.7 pg (ref 26.0–34.0)
MCHC: 32 g/dL (ref 30.0–36.0)
MCV: 96 fL (ref 78.0–100.0)
Platelets: 222 10*3/uL (ref 150–400)
RBC: 4.27 MIL/uL (ref 3.87–5.11)
RDW: 13.3 % (ref 11.5–15.5)
WBC: 9.3 10*3/uL (ref 4.0–10.5)

## 2017-11-22 LAB — BASIC METABOLIC PANEL
ANION GAP: 9 (ref 5–15)
BUN: 10 mg/dL (ref 8–23)
CO2: 25 mmol/L (ref 22–32)
Calcium: 9.5 mg/dL (ref 8.9–10.3)
Chloride: 105 mmol/L (ref 98–111)
Creatinine, Ser: 0.93 mg/dL (ref 0.44–1.00)
GLUCOSE: 115 mg/dL — AB (ref 70–99)
POTASSIUM: 3.5 mmol/L (ref 3.5–5.1)
SODIUM: 139 mmol/L (ref 135–145)

## 2017-11-22 MED ORDER — ETOMIDATE 2 MG/ML IV SOLN
10.0000 mg | Freq: Once | INTRAVENOUS | Status: AC
  Administered 2017-11-22: 10 mg via INTRAVENOUS
  Filled 2017-11-22: qty 10

## 2017-11-22 MED ORDER — SODIUM CHLORIDE 0.9 % IV BOLUS
1000.0000 mL | Freq: Once | INTRAVENOUS | Status: AC
  Administered 2017-11-22: 1000 mL via INTRAVENOUS

## 2017-11-22 NOTE — Progress Notes (Signed)
Patient ID: GIRTIE WIERSMA, female   DOB: 08/24/1950, 67 y.o.   MRN: [id number]     Cardiology Office Note    Date:  11/22/2017   ID:  Shanayah Kaffenberger [place], DOB 09/16/1950, MRN [id number]  PCP:  Crist Infante, MD  Cardiologist:   Sanda Klein, MD   Chief Complaint  Patient presents with  . Follow-up    Emergency room visit for atrial fibrillation and cardioversion last night    History of Present Illness:  Kynnedi Zweig [place] is a 67 y.o. 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   She was woken at 1:30 AM with rapid palpitations.  She has mild shortness of breath but did not have syncope or chest pain.  She felt very anxious and went to the emergency room.  She was treated with an emergency cardioversion and felt much better afterwards.  Other than last night episode she has had a couple of brief periods of rapid rhythm that resolved spontaneously.  This is the first time she has required cardioversion in about 3 years.  Last November she was seen in the emergency room with atrial fibrillation rapid ventricular response and converted after receiving flecainide. However, the conversion happened 3-1/2 hours after the flecainide was administered and it is unclear whether it was really a drug induced cardioversion.She is compliant with daily anticoagulants and has not had any bleeding problems, follows or other complications.  She believes that she may have taken an extra dose of Eliquis last night by mistake.  She has not missed any doses.  She is scheduled for colonoscopy next month with Dr. Cristina Gong.  She is under quite a bit of emotional stress.  Mr. & Mrs. Klenke are raising their granddaughter Alma Friendly, since their son is having to deal with problems related to substance abuse.   Past Medical  History:  Diagnosis Date  . Hypertension   . Liver cirrhosis secondary to NASH (nonalcoholic steatohepatitis) ([place])   . Ocular migraine   . Palpitations    PAC's,PVC's,atrial tachycardia  . Paroxysmal atrial fibrillation [facility])     Past Surgical History:  Procedure Laterality Date  . BREAST BIOPSY  1994  . CARDIAC CATHETERIZATION  11/05/2007   normal LV fx,patent coronary arteries  . NM MYOVIEW LTD  03/12/2005   normal  . US ECHOCARDIOGRAPHY  11/26/2007   mild LVH,trace MR,TR    Outpatient Medications Prior to Visit  Medication Sig Dispense Refill  . Cholecalciferol (VITAMIN D3) 5000 units CAPS Take 2 capsules by mouth every Monday, Tuesday, Wednesday, Thursday, and Friday.     Marland Kitchen ELIQUIS 5 MG TABS tablet TAKE ONE TABLET BY MOUTH TWICE DAILY  180 tablet 1  . fexofenadine (ALLEGRA) 180 MG tablet Take 180 mg by mouth daily as needed for allergies or rhinitis.    . metoprolol succinate (TOPROL-XL) 50 MG 24 hr tablet Take 25-50 mg by mouth See admin instructions. Take 50 mg in the morning and 25 mg in the evening, Take with or immediately following a meal.     . trimethoprim (TRIMPEX) 100 MG tablet Take 1 tablet by mouth daily.     No facility-administered medications prior to visit.      Allergies:   Macrodantin [nitrofurantoin macrocrystal]; Ciprofloxacin; and Sulfa antibiotics   Social History   Socioeconomic History  . Marital status: Married    Spouse name: Not on file  .  Number of children: Not on file  . Years of education: Not on file  . Highest education level: Not on file  Occupational History  . Not on file  Social Needs  . Financial resource strain: Not on file  . Food insecurity:    Worry: Not on file    Inability: Not on file  . Transportation needs:    Medical: Not on file    Non-medical: Not on file  Tobacco Use  . Smoking status: Never Smoker  . Smokeless tobacco: Never Used  Substance and Sexual Activity  . Alcohol use: No    Alcohol/week: 0.0  standard drinks  . Drug use: No  . Sexual activity: Not on file  Lifestyle  . Physical activity:    Days per week: Not on file    Minutes per session: Not on file  . Stress: Not on file  Relationships  . Social connections:    Talks on phone: Not on file    Gets together: Not on file    Attends religious service: Not on file    Active member of club or organization: Not on file    Attends meetings of clubs or organizations: Not on file    Relationship status: Not on file  Other Topics Concern  . Not on file  Social History Narrative   Pt lives in [place] with spouse.  Retired Control and instrumentation engineer (kindergarten)     Family History:  The patient's family history includes Diabetes in her father and mother; Hypertension in her father and mother; Kidney disease in her mother.   ROS:   Please see the history of present illness.    ROS All other systems are reviewed and are negative   PHYSICAL EXAM:   VS:  BP (!) 142/73   Pulse 70   Ht 5\' 8"  (1.727 m)   Wt 165 lb (74.8 kg)   BMI 25.09 kg/m      General: Alert, oriented x3, no distress, normal weight Head: no evidence of trauma, PERRL, EOMI, no exophtalmos or lid lag, no myxedema, no xanthelasma; normal ears, nose and oropharynx Neck: normal jugular venous pulsations and no hepatojugular reflux; brisk carotid pulses without delay and no carotid bruits Chest: clear to auscultation, no signs of consolidation by percussion or palpation, normal fremitus, symmetrical and full respiratory excursions Cardiovascular: normal position and quality of the apical impulse, regular rhythm, normal first and second heart sounds, no murmurs, rubs or gallops Abdomen: no tenderness or distention, no masses by palpation, no abnormal pulsatility or arterial bruits, normal bowel sounds, no hepatosplenomegaly Extremities: no clubbing, cyanosis or edema; 2+ radial, ulnar and brachial pulses bilaterally; 2+ right femoral, posterior tibial and dorsalis  pedis pulses; 2+ left femoral, posterior tibial and dorsalis pedis pulses; no subclavian or femoral bruits Neurological: grossly nonfocal Psych: Normal mood and affect    Wt Readings from Last 3 Encounters:  11/22/17 165 lb (74.8 kg)  11/22/17 165 lb (74.8 kg)  10/15/17 165 lb 3.2 oz (74.9 kg)      Studies/Labs Reviewed:   EKG:  EKG from approximately 3 AM today shows atrial fibrillation with rapid ventricular response and prominent diffuse ST segment depression.  A second tracing from about 4 AM shows normal sinus rhythm following cardioversion, with resolution of the ST segment changes.  Recent Labs: 11/22/2017: BUN 10; Creatinine, Ser 0.93; Hemoglobin 13.1; Platelets 222; Potassium 3.5; Sodium 139   Lipid Panel    Component Value Date/Time   CHOL  11/05/2007 1610  136        ATP III CLASSIFICATION:  <200     mg/dL   Desirable  200-239  mg/dL   Borderline High  >=240    mg/dL   High   TRIG 145 11/05/2007 0821   HDL 36 (L) 11/05/2007 0821   CHOLHDL 3.8 11/05/2007 0821   VLDL 29 11/05/2007 0821   LDLCALC  11/05/2007 0821    71        Total Cholesterol/HDL:CHD Risk Coronary Heart Disease Risk Table                     Men   Women  1/2 Average Risk   3.4   3.3     ASSESSMENT:    1. Paroxysmal atrial fibrillation (HCC)   2. Essential hypertension   3. Long term current use of anticoagulant      PLAN:  In order of problems listed above:  1. AFib: Shylin has episodes of paroxysmal atrial fibrillation that are symptomatic but very infrequent.  This is the first meaningful event in 3 years.  Compliant with anticoagulation. CHADSVasc 3  (age, gender, HTN).  There was questionable success with flecainide 3 years ago.  We reviewed the potential for use of antiarrhythmic drugs on a maintenance basis or referral for radiofrequency ablation, but the infrequent nature of her disorder still makes the risks of these interventions outweigh the potential benefits.  If she has  recurrence of arrhythmia within the next 12 months or so, I would favor referral for ablation since she has features that suggest good chance for long-term benefit (paroxysmal arrhythmia absence of significant structural heart disease, small left atrium).  Also discussed the fact that some episodes of atrial fibrillation are likely to stop spontaneously.  If she does not have symptoms of shortness of breath, angina or syncope, I would recommend taking an extra dose of metoprolol succinate and finding a quiet room to lie down or relax.  It is possible the arrhythmia will go away spontaneously next few hours.  She should always seek emergency attention if she has worrisome accompanying symptoms of angina/dyspnea/syncope.  2. HTN: Blood pressure is fairly well controlled on current regimen.  Her blood pressure is a little high right now, but she spent most of last night in the emergency room.  She looks very tired.  3. Anticoagulation: Unfortunately, I think it is best that she postpones her planned colonoscopy, which is scheduled in a couple of weeks.  I would advise continuation of anticoagulation without even a single day of interruption for 30 days following her cardioversion.  We will ask Dr. Cristina Gong to please reschedule.   Medication Adjustments/Labs and Tests Ordered: Current medicines are reviewed at length with the patient today.  Concerns regarding medicines are outlined above.  Medication changes, Labs and Tests ordered today are listed in the Patient Instructions below. Patient Instructions  Dr Sallyanne Kuster recommends that you schedule a follow-up appointment in 6 months. You will receive a reminder letter in the mail two months in advance. If you don't receive a letter, please call our office to schedule the follow-up appointment.  If you need a refill on your cardiac medications before your next appointment, please call your pharmacy.      Signed, Sanda Klein, MD  11/22/2017 7:04 PM      [address], [place], [place]  [address] Phone: [phone]; Fax: [phone]

## 2017-11-22 NOTE — ED Triage Notes (Addendum)
Pt reports Afib since 2015. Only has had 2 episodes in the past, pt reports palpitations waking her up tonight. Pt reports dry mouth. Denies pain. Pt takes Eliquis and metoprolol daily and reports taking 25mg  before coming to hospital.

## 2017-11-22 NOTE — Patient Instructions (Signed)
Dr Croitoru recommends that you schedule a follow-up appointment in 6 months. You will receive a reminder letter in the mail two months in advance. If you don't receive a letter, please call our office to schedule the follow-up appointment.  If you need a refill on your cardiac medications before your next appointment, please call your pharmacy. 

## 2017-11-22 NOTE — Sedation Documentation (Signed)
Pt cardioverted at 150J

## 2017-11-22 NOTE — ED Provider Notes (Addendum)
[place] EMERGENCY DEPARTMENT Provider Note  CSN: [id number] Arrival date & time: 11/22/17 0255  Chief Complaint(s) Atrial Fibrillation  HPI Amanda Frost is a 67 y.o. female with a history of paroxysmal A. Fib (CHASVasc3) on metoprolol and Eliquis who presents to the emergency department with rapid heart rate and palpitations which began just prior to arrival.  Patient reports being woken up in the middle the night due to the palpitations and mild chest discomfort.  She denies any recent fevers or infections.  No recent nausea or vomiting.  No overt chest pain or shortness of breath.  No abdominal pain.  No urinary symptoms.  She denies any alcohol consumption.  Does not believe she is dehydrated.  States that she has been compliant and has not missed any of her anticoagulation medication.  HPI  Past Medical History Past Medical History:  Diagnosis Date  . Hypertension   . Liver cirrhosis secondary to NASH (nonalcoholic steatohepatitis) ([place])   . Ocular migraine   . Palpitations    PAC's,PVC's,atrial tachycardia  . Paroxysmal atrial fibrillation [facility])    Patient Active Problem List   Diagnosis Date Noted  . Palpitations 10/15/2017  . Chronic anticoagulation 12/05/2016  . Paroxysmal atrial fibrillation ([place]) 01/21/2014  . Liver cirrhosis secondary to NASH ([place]) 01/21/2014  . Essential hypertension 10/29/2012  . Paroxysmal atrial tachycardia ([place]) 10/29/2012   Home Medication(s) Prior to Admission medications   Medication Sig Start Date End Date Taking? Authorizing Provider  adapalene (DIFFERIN) 0.1 % cream Apply 1 application topically daily as needed (breakouts).    [provider]  Cholecalciferol (VITAMIN D3) 5000 units CAPS Take 2 capsules by mouth daily. Monday - Friday    [provider]  ELIQUIS 5 MG TABS tablet TAKE ONE TABLET BY MOUTH TWICE DAILY  08/06/17   Croitoru, Dani Gobble, MD  fexofenadine (ALLEGRA) 180 MG tablet Take 180  mg by mouth daily as needed for allergies or rhinitis.    [provider]  metoprolol succinate (TOPROL-XL) 50 MG 24 hr tablet Take 50 mg in the morning and 25 mg in the evening, Take with or immediately following a meal.    [provider]  trimethoprim (TRIMPEX) 100 MG tablet Take 1 tablet by mouth daily. 11/29/16   [provider]                                                                                                                                    Past Surgical History Past Surgical History:  Procedure Laterality Date  . BREAST BIOPSY  1994  . CARDIAC CATHETERIZATION  11/05/2007   normal LV fx,patent coronary arteries  . NM MYOVIEW LTD  03/12/2005   normal  . US ECHOCARDIOGRAPHY  11/26/2007   mild LVH,trace MR,TR   Family History Family History  Problem Relation Age of Onset  . Diabetes Mother   . Hypertension Mother   . Kidney  disease Mother   . Diabetes Father   . Hypertension Father     Social History Social History   Tobacco Use  . Smoking status: Never Smoker  . Smokeless tobacco: Never Used  Substance Use Topics  . Alcohol use: No    Alcohol/week: 0.0 standard drinks  . Drug use: No   Allergies Macrodantin [nitrofurantoin macrocrystal]; Ciprofloxacin; and Sulfa antibiotics  Review of Systems Review of Systems All other systems are reviewed and are negative for acute change except as noted in the HPI  Physical Exam Vital Signs  I have reviewed the triage vital signs BP (!) 150/106   Pulse 154   Temp 98.7 F (37.1 C) (Oral)   Resp 16   Ht 5' 7.5" (1.715 m)   Wt 74.8 kg   SpO2 100%   BMI 25.46 kg/m   Physical Exam  Constitutional: She is oriented to person, place, and time. She appears well-developed and well-nourished. No distress.  HENT:  Head: Normocephalic and atraumatic.  Nose: Nose normal.  Eyes: Pupils are equal, round, and reactive to light. Conjunctivae and EOM are normal. Right eye exhibits no discharge.  Left eye exhibits no discharge. No scleral icterus.  Neck: Normal range of motion. Neck supple.  Cardiovascular: An irregularly irregular rhythm present. Tachycardia present. Exam reveals no gallop and no friction rub.  No murmur heard. Pulmonary/Chest: Effort normal and breath sounds normal. No stridor. No respiratory distress. She has no rales.  Abdominal: Soft. She exhibits no distension. There is no tenderness.  Musculoskeletal: She exhibits no edema or tenderness.  Neurological: She is alert and oriented to person, place, and time.  Skin: Skin is warm and dry. No rash noted. She is not diaphoretic. No erythema.  Psychiatric: She has a normal mood and affect.  Vitals reviewed.   ED Results and Treatments Labs (all labs ordered are listed, but only abnormal results are displayed) Labs Reviewed  BASIC METABOLIC PANEL - Abnormal; Notable for the following components:      Result Value   Glucose, Bld 115 (*)    All other components within normal limits  CBC                 EKG  EKG Interpretation  Date/Time:  Friday November 22 2017 02:56:21 EDT Ventricular Rate:  148 PR Interval:    QRS Duration: 80 QT Interval:  294 QTC Calculation: 461 R Axis:   51 Text Interpretation: Atrial fibrillation with rapid ventricular response.  Marked ST abnormality, possible inferior subendocardial injury.  Abnormal EKG.                                                                                                                EKG  EKG Interpretation  Date/Time:  Friday November 22 2017 04:04:37 EDT Ventricular Rate:  81 PR Interval:    QRS Duration: 96 QT Interval:  381 QTC Calculation: 443 R Axis:   48 Text Interpretation:  Sinus rhythm Abnormal R-wave progression, early transition Atrial fibrillation RESOLVED SINCE PREVIOUS Confirmed  by Addison Lank [phone]) on 11/22/2017 4:09:06 AM      Radiology No results found. Pertinent labs & imaging results that were available during my  care of the patient were reviewed by me and considered in my medical decision making (see chart for details).  Medications Ordered in ED Medications  sodium chloride 0.9 % bolus 1,000 mL (1,000 mLs Intravenous New Bag/Given 11/22/17 0400)  etomidate (AMIDATE) injection 10 mg (10 mg Intravenous Given 11/22/17 0402)                                                                                                                                    Procedures .Critical Care Performed by: Fatima Blank, MD Authorized by: Fatima Blank, MD   .Sedation Date/Time: 11/22/2017 4:09 AM Performed by: Fatima Blank, MD Authorized by: Fatima Blank, MD   Consent:    Consent obtained:  Verbal   Consent given by:  Patient   Risks discussed:  Allergic reaction, dysrhythmia, inadequate sedation, nausea, prolonged hypoxia resulting in organ damage, prolonged sedation necessitating reversal, respiratory compromise necessitating ventilatory assistance and intubation and vomiting   Alternatives discussed:  Analgesia without sedation, anxiolysis and regional anesthesia Universal protocol:    Procedure explained and questions answered to patient or proxy's satisfaction: yes     Relevant documents present and verified: yes     Test results available and properly labeled: yes     Imaging studies available: yes     Required blood products, implants, devices, and special equipment available: yes     Site/side marked: yes     Immediately prior to procedure a time out was called: yes     Patient identity confirmation method:  Verbally with patient Indications:    Procedure necessitating sedation performed by:  Physician performing sedation   Intended level of sedation:  Deep Pre-sedation assessment:    Time since last food or drink:  6    ASA classification: class 2 - patient with mild systemic disease     Neck mobility: normal     Mouth opening:  3 or more finger widths    Thyromental distance:  4 finger widths   Mallampati score:  I - soft palate, uvula, fauces, pillars visible   Pre-sedation assessments completed and reviewed: airway patency, cardiovascular function, hydration status, mental status, nausea/vomiting, pain level, respiratory function and temperature     Pre-sedation assessment completed:  11/22/2017 3:45 AM Immediate pre-procedure details:    Reassessment: Patient reassessed immediately prior to procedure     Reviewed: vital signs, relevant labs/tests and NPO status     Verified: bag valve mask available, emergency equipment available, intubation equipment available, IV patency confirmed, oxygen available and suction available   Procedure details (see MAR for exact dosages):    Preoxygenation:  Nasal cannula   Sedation:  Etomidate   Intra-procedure monitoring:  Blood pressure monitoring, cardiac monitor, continuous pulse oximetry, frequent  LOC assessments, frequent vital sign checks and continuous capnometry   Intra-procedure events: none     Total Provider sedation time (minutes):  10 Post-procedure details:    Post-sedation assessment completed:  11/22/2017 4:10 AM   Attendance: Constant attendance by certified staff until patient recovered     Recovery: Patient returned to pre-procedure baseline     Post-sedation assessments completed and reviewed: airway patency, cardiovascular function, hydration status, mental status, nausea/vomiting, pain level, respiratory function and temperature     Patient is stable for discharge or admission: yes     Patient tolerance:  Tolerated well, no immediate complications .Cardioversion Date/Time: 11/22/2017 4:11 AM Performed by: Fatima Blank, MD Authorized by: Fatima Blank, MD   Consent:    Consent obtained:  Verbal   Consent given by:  Patient   Risks discussed:  Cutaneous burn, death, induced arrhythmia and pain   Alternatives discussed:  Rate-control medication and alternative  treatment Pre-procedure details:    Cardioversion basis:  Elective   Rhythm:  Atrial fibrillation   Electrode placement:  Anterior-posterior Patient sedated: Yes. Refer to sedation procedure documentation for details of sedation.  Attempt one:    Cardioversion mode:  Synchronous   Waveform:  Biphasic   Shock (Joules):  150   Shock outcome:  Conversion to normal sinus rhythm Post-procedure details:    Patient status:  Awake   Patient tolerance of procedure:  Tolerated well, no immediate complications   CRITICAL CARE Performed by: Grayce Sessions Peggy Loge Total critical care time: 35 minutes Critical care time was exclusive of separately billable procedures and treating other patients. Critical care was necessary to treat or prevent imminent or life-threatening deterioration. Critical care was time spent personally by me on the following activities: development of treatment plan with patient and/or surrogate as well as nursing, discussions with consultants, evaluation of patient's response to treatment, examination of patient, obtaining history from patient or surrogate, ordering and performing treatments and interventions, ordering and review of laboratory studies, ordering and review of radiographic studies, pulse oximetry and re-evaluation of patient's condition.   (including critical care time)  Medical Decision Making / ED Course I have reviewed the nursing notes for this encounter and the patient's prior records (if available in EHR or on provided paperwork).    A. fib RVR.  Discussed chemical versus electrical cardioversion.  Patient chose electrical cardioversion which was performed as above.  Converted to normal sinus rhythm.  Monitored for 1 hour post conversion.  Remained hemodynamically stable with no recurrence.  Patient reports that she already has a follow-up with cardiology later on today.   The patient appears reasonably screened and/or stabilized for discharge and I  doubt any other medical condition or other [facility] requiring further screening, evaluation, or treatment in the ED at this time prior to discharge.  The patient is safe for discharge with strict return precautions.   Final Clinical Impression(s) / ED Diagnoses Final diagnoses:  Atrial fibrillation with RVR ([place])     Disposition: Discharge  Condition: Good  I have discussed the results, Dx and Tx plan with the patient and husband who expressed understanding and agree(s) with the plan. Discharge instructions discussed at great length. The patient and husband were given strict return precautions who verbalized understanding of the instructions. No further questions at time of discharge.    ED Discharge Orders    None       Follow Up: Sanda Klein, [address] [place] [address] [phone]  Go today As scheduled    This chart was dictated using voice recognition software.  Despite best efforts to proofread,  errors can occur which can change the documentation meaning.   Fatima Blank, MD 11/22/17 0052    Fatima Blank, MD 11/25/17 [phone]

## 2017-11-25 ENCOUNTER — Ambulatory Visit (HOSPITAL_COMMUNITY)
Admission: EM | Admit: 2017-11-25 | Discharge: 2017-11-25 | Disposition: A | Discharge status: Home / Self Care | Payer: PPO | Attending: Family Medicine | Admitting: Family Medicine

## 2017-11-25 ENCOUNTER — Other Ambulatory Visit: Payer: Self-pay

## 2017-11-25 ENCOUNTER — Encounter (HOSPITAL_COMMUNITY): Payer: Self-pay | Admitting: Emergency Medicine

## 2017-11-25 DIAGNOSIS — S0033XA Contusion of nose, initial encounter: Secondary | ICD-10-CM

## 2017-11-25 NOTE — ED Provider Notes (Signed)
[name]    CSN: [id number] Arrival date & time: 11/25/17  1829     History   Chief Complaint No chief complaint on file.   HPI Amanda Frost is a 67 y.o. female.   Amanda Frost presents with complaints of nasal and facial pain after her granddaughter accidentally struck Amanda Frost's nose with her head. This occurred two days ago. Did not lose consciousness. No bleeding, minimal swelling. Had immediate nose and facial pain. Pain has improved today. Amanda Frost presents with concern as she is on Eliquis and just recently had a cardioversion as well. No further chest palpitations or pain. Took tylenol which minimally helped. Has not had any runny nose, eye ball pain. No eye swelling or bruising. No bruising to nose. Only some pain to right side of nose with palpation currently but able to wear her glasses without any discomfort. Denies any previous similar. Hx of htn, liver cirrhosis, palpitations, afib.     ROS per HPI.      Past Medical History:  Diagnosis Date  . Hypertension   . Liver cirrhosis secondary to NASH (nonalcoholic steatohepatitis) ([place])   . Ocular migraine   . Palpitations    PAC's,PVC's,atrial tachycardia  . Paroxysmal atrial fibrillation [facility])     Patient Active Problem List   Diagnosis Date Noted  . Palpitations 10/15/2017  . Chronic anticoagulation 12/05/2016  . Paroxysmal atrial fibrillation ([place]) 01/21/2014  . Liver cirrhosis secondary to NASH ([place]) 01/21/2014  . Essential hypertension 10/29/2012  . Paroxysmal atrial tachycardia ([place]) 10/29/2012    Past Surgical History:  Procedure Laterality Date  . BREAST BIOPSY  1994  . CARDIAC CATHETERIZATION  11/05/2007   normal LV fx,patent coronary arteries  . NM MYOVIEW LTD  03/12/2005   normal  . US ECHOCARDIOGRAPHY  11/26/2007   mild LVH,trace MR,TR    OB History   None      Home Medications    Prior to Admission medications   Medication Sig Start Date End Date Taking? Authorizing  Provider  acetaminophen (TYLENOL) 325 MG tablet Take 650 mg by mouth every 6 (six) hours as needed.   Yes [provider]  Cholecalciferol (VITAMIN D3) 5000 units CAPS Take 2 capsules by mouth every Monday, Tuesday, Wednesday, Thursday, and Friday.     [provider]  ELIQUIS 5 MG TABS tablet TAKE ONE TABLET BY MOUTH TWICE DAILY  08/06/17   Croitoru, Dani Gobble, MD  fexofenadine (ALLEGRA) 180 MG tablet Take 180 mg by mouth daily as needed for allergies or rhinitis.    [provider]  metoprolol succinate (TOPROL-XL) 50 MG 24 hr tablet Take 25-50 mg by mouth See admin instructions. Take 50 mg in the morning and 25 mg in the evening, Take with or immediately following a meal.     [provider]  trimethoprim (TRIMPEX) 100 MG tablet Take 1 tablet by mouth daily. 11/29/16   [provider]    Family History Family History  Problem Relation Age of Onset  . Diabetes Mother   . Hypertension Mother   . Kidney disease Mother   . Diabetes Father   . Hypertension Father     Social History Social History   Tobacco Use  . Smoking status: Never Smoker  . Smokeless tobacco: Never Used  Substance Use Topics  . Alcohol use: No    Alcohol/week: 0.0 standard drinks  . Drug use: No     Allergies   Macrodantin [nitrofurantoin macrocrystal]; Ciprofloxacin; and Sulfa antibiotics  Review of Systems Review of Systems   Physical Exam Triage Vital Signs ED Triage Vitals  Enc Vitals Group     BP 11/25/17 1920 (!) 143/83     Pulse Rate 11/25/17 1920 62     Resp 11/25/17 1920 18     Temp 11/25/17 1920 97.9 F (36.6 C)     Temp Source 11/25/17 1920 Oral     SpO2 11/25/17 1920 96 %     Weight --      Height --      Head Circumference --      Peak Flow --      Pain Score 11/25/17 1917 0     Pain Loc --      Pain Edu? --      Excl. in [place]? --    No data found.  Updated Vital Signs BP (!) 143/83 (BP Location: Right Arm)   Pulse 62   Temp 97.9 F  (36.6 C) (Oral)   Resp 18   SpO2 96%    Physical Exam  Constitutional: She is oriented to person, place, and time. She appears well-developed and well-nourished. No distress.  HENT:  Head: Normocephalic and atraumatic. Not macrocephalic and not microcephalic. Head is without raccoon's eyes, without Battle's sign, without abrasion, without contusion, without laceration, without right periorbital erythema and without left periorbital erythema. Hair is normal.  Nose: Sinus tenderness present. No mucosal edema, rhinorrhea, nose lacerations, nasal deformity, septal deviation or nasal septal hematoma. No epistaxis.  No foreign bodies. Right sinus exhibits maxillary sinus tenderness.  Patient with mild tenderness to bridge of nose on right side which extends to under right eye; no crepitus; no swelling, no deformity visually or on palpation  Eyes: Pupils are equal, round, and reactive to light. Conjunctivae, EOM and lids are normal.  Cardiovascular: Normal rate, regular rhythm and normal heart sounds.  Pulmonary/Chest: Effort normal and breath sounds normal.  Neurological: She is alert and oriented to person, place, and time.  Skin: Skin is warm and dry.     UC Treatments / Results  Labs (all labs ordered are listed, but only abnormal results are displayed) Labs Reviewed - No data to display  EKG None  Radiology No results found.  Procedures Procedures (including critical care time)  Medications Ordered in UC Medications - No data to display  Initial Impression / Assessment and Plan / UC Course  I have reviewed the triage vital signs and the nursing notes.  Pertinent labs & imaging results that were available during my care of the patient were reviewed by me and considered in my medical decision making (see chart for details).     Improvement of pain s/p injury to nose two days ago. No swelling, bruising, crepitus, bleeding or sinus drainage. No deformity, eye concerns,  difficulty breathing. No imaging at this time. Return precautions provided. If any persistent symptoms to follow up with ENT. Patient verbalized understanding and agreeable to plan.   Final Clinical Impressions(s) / UC Diagnoses   Final diagnoses:  Contusion of nose, initial encounter     Discharge Instructions     Your exam was reassuring today Continue with ice and tylenol as needed for pain or swelling.  If persistent or worsening of any symptoms, difficulty breathing through nose, persistent runny nose or pain to face please follow up with ENT.    ED Prescriptions    None     Controlled Substance Prescriptions [place] Controlled Substance Registry consulted? Not Applicable  Zigmund Gottron, NP 11/25/17 [phone]

## 2017-11-25 NOTE — ED Triage Notes (Addendum)
8/23 at [facility] for cardioversion and on eliquis.    Grandchild accidentally hit patient in the nose with her own head.  Patient continues to have soreness to nose, forehead and eyes.  Patient is concerned for complications with medication incident occurred 2 days ago.

## 2017-11-25 NOTE — Discharge Instructions (Signed)
Your exam was reassuring today Continue with ice and tylenol as needed for pain or swelling.  If persistent or worsening of any symptoms, difficulty breathing through nose, persistent runny nose or pain to face please follow up with ENT.

## 2017-11-26 ENCOUNTER — Telehealth: Payer: Self-pay | Admitting: Cardiovascular Disease

## 2017-11-26 NOTE — Telephone Encounter (Signed)
New Message:    Pt has questions about her ELIQUIS 5 MG TABS tablet

## 2017-11-26 NOTE — Telephone Encounter (Signed)
Spoke with pt. Pt sts that she is currently taking Eliquis 5mg  bid. She sts that when she scratches or pricks her finger sometimes it does not bleed a lot. She is wondering if Eliquis is working.  Adv pt that because she is not having bruising or excess bleeding does not mean that her Eliquis is not therapeutic. Adv her that it depends on the individual. She should continue to take it as prescribed with no interruption in therapy.  Pt verbalized understanding and voiced appreciation for the call.

## 2017-12-27 DIAGNOSIS — I872 Venous insufficiency (chronic) (peripheral): Secondary | ICD-10-CM | POA: Diagnosis not present

## 2017-12-27 DIAGNOSIS — I8312 Varicose veins of left lower extremity with inflammation: Secondary | ICD-10-CM | POA: Diagnosis not present

## 2017-12-27 DIAGNOSIS — I8311 Varicose veins of right lower extremity with inflammation: Secondary | ICD-10-CM | POA: Diagnosis not present

## 2017-12-27 DIAGNOSIS — Z85828 Personal history of other malignant neoplasm of skin: Secondary | ICD-10-CM | POA: Diagnosis not present

## 2017-12-27 DIAGNOSIS — L308 Other specified dermatitis: Secondary | ICD-10-CM | POA: Diagnosis not present

## 2017-12-31 DIAGNOSIS — N3000 Acute cystitis without hematuria: Secondary | ICD-10-CM | POA: Diagnosis not present

## 2017-12-31 DIAGNOSIS — R350 Frequency of micturition: Secondary | ICD-10-CM | POA: Diagnosis not present

## 2018-01-03 ENCOUNTER — Other Ambulatory Visit: Payer: Self-pay | Admitting: Cardiovascular Disease

## 2018-01-04 DIAGNOSIS — Z23 Encounter for immunization: Secondary | ICD-10-CM | POA: Diagnosis not present

## 2018-02-05 DIAGNOSIS — H2513 Age-related nuclear cataract, bilateral: Secondary | ICD-10-CM | POA: Diagnosis not present

## 2018-02-05 DIAGNOSIS — H40013 Open angle with borderline findings, low risk, bilateral: Secondary | ICD-10-CM | POA: Diagnosis not present

## 2018-02-05 DIAGNOSIS — H25013 Cortical age-related cataract, bilateral: Secondary | ICD-10-CM | POA: Diagnosis not present

## 2018-02-05 DIAGNOSIS — H35033 Hypertensive retinopathy, bilateral: Secondary | ICD-10-CM | POA: Diagnosis not present

## 2018-02-06 DIAGNOSIS — E119 Type 2 diabetes mellitus without complications: Secondary | ICD-10-CM | POA: Diagnosis not present

## 2018-02-06 DIAGNOSIS — R82998 Other abnormal findings in urine: Secondary | ICD-10-CM | POA: Diagnosis not present

## 2018-02-06 DIAGNOSIS — E559 Vitamin D deficiency, unspecified: Secondary | ICD-10-CM | POA: Diagnosis not present

## 2018-02-06 DIAGNOSIS — E7849 Other hyperlipidemia: Secondary | ICD-10-CM | POA: Diagnosis not present

## 2018-02-06 DIAGNOSIS — I10 Essential (primary) hypertension: Secondary | ICD-10-CM | POA: Diagnosis not present

## 2018-02-13 DIAGNOSIS — M810 Age-related osteoporosis without current pathological fracture: Secondary | ICD-10-CM | POA: Diagnosis not present

## 2018-02-13 DIAGNOSIS — I480 Paroxysmal atrial fibrillation: Secondary | ICD-10-CM | POA: Diagnosis not present

## 2018-02-13 DIAGNOSIS — Z1389 Encounter for screening for other disorder: Secondary | ICD-10-CM | POA: Diagnosis not present

## 2018-02-13 DIAGNOSIS — Z0000 Encounter for general adult medical examination without abnormal findings: Secondary | ICD-10-CM | POA: Diagnosis not present

## 2018-02-13 DIAGNOSIS — E7849 Other hyperlipidemia: Secondary | ICD-10-CM | POA: Diagnosis not present

## 2018-02-13 DIAGNOSIS — I10 Essential (primary) hypertension: Secondary | ICD-10-CM | POA: Diagnosis not present

## 2018-02-13 DIAGNOSIS — Z6825 Body mass index (BMI) 25.0-25.9, adult: Secondary | ICD-10-CM | POA: Diagnosis not present

## 2018-02-13 DIAGNOSIS — E119 Type 2 diabetes mellitus without complications: Secondary | ICD-10-CM | POA: Diagnosis not present

## 2018-02-13 DIAGNOSIS — H6123 Impacted cerumen, bilateral: Secondary | ICD-10-CM | POA: Diagnosis not present

## 2018-02-13 DIAGNOSIS — D126 Benign neoplasm of colon, unspecified: Secondary | ICD-10-CM | POA: Diagnosis not present

## 2018-02-13 DIAGNOSIS — N390 Urinary tract infection, site not specified: Secondary | ICD-10-CM | POA: Diagnosis not present

## 2018-02-13 DIAGNOSIS — E559 Vitamin D deficiency, unspecified: Secondary | ICD-10-CM | POA: Diagnosis not present

## 2018-03-12 DIAGNOSIS — I10 Essential (primary) hypertension: Secondary | ICD-10-CM | POA: Diagnosis not present

## 2018-04-07 DIAGNOSIS — N3000 Acute cystitis without hematuria: Secondary | ICD-10-CM | POA: Diagnosis not present

## 2018-04-07 DIAGNOSIS — R350 Frequency of micturition: Secondary | ICD-10-CM | POA: Diagnosis not present

## 2018-04-07 DIAGNOSIS — R351 Nocturia: Secondary | ICD-10-CM | POA: Diagnosis not present

## 2018-04-17 ENCOUNTER — Other Ambulatory Visit: Payer: Self-pay | Admitting: Cardiovascular Disease

## 2018-04-23 DIAGNOSIS — N3000 Acute cystitis without hematuria: Secondary | ICD-10-CM | POA: Diagnosis not present

## 2018-05-05 ENCOUNTER — Telehealth: Payer: Self-pay | Admitting: Cardiovascular Disease

## 2018-05-05 NOTE — Telephone Encounter (Signed)
Patient is having a colonoscopy tomorrow is has some questions about the prep she has to do tonight. Since she has A-Fib.

## 2018-05-05 NOTE — Telephone Encounter (Signed)
Called patient had questions regarding solution for the colonoscopy, and to stop her Eliquis the day of, I spoke with Dr.Croitoru who advised that the solution was safe for her condition, and it was correct regarding eliquis.  Patient was thankful for the call.

## 2018-05-06 DIAGNOSIS — Z86010 Personal history of colonic polyps: Secondary | ICD-10-CM | POA: Diagnosis not present

## 2018-05-06 DIAGNOSIS — D120 Benign neoplasm of cecum: Secondary | ICD-10-CM | POA: Diagnosis not present

## 2018-05-09 DIAGNOSIS — D120 Benign neoplasm of cecum: Secondary | ICD-10-CM | POA: Diagnosis not present

## 2018-05-19 ENCOUNTER — Telehealth: Payer: Self-pay | Admitting: Cardiovascular Disease

## 2018-05-19 NOTE — Telephone Encounter (Signed)
Spoke with patient regarding shortness of breath she has been having off and on over the last week. It comes on with no specific reason not relieved by anything, not worse with exertion.  She has had this before and was told secondary to anxiety. She has been under more stress lately. She stated this did not feel like her Afib and she can always feel that. She has appointment with Arnold Long DNP next week. Discussed with Janan Ridge PA continue to monitor or call back if worse,  follow up with PCP since like what she had with anxiety in past.

## 2018-05-21 ENCOUNTER — Emergency Department (HOSPITAL_COMMUNITY): Payer: PPO

## 2018-05-21 ENCOUNTER — Emergency Department (HOSPITAL_COMMUNITY)
Admission: EM | Admit: 2018-05-21 | Discharge: 2018-05-21 | Disposition: A | Discharge status: Home / Self Care | Payer: PPO | Attending: Emergency Medicine | Admitting: Emergency Medicine

## 2018-05-21 ENCOUNTER — Other Ambulatory Visit: Payer: Self-pay

## 2018-05-21 ENCOUNTER — Encounter (HOSPITAL_COMMUNITY): Payer: Self-pay | Admitting: Emergency Medicine

## 2018-05-21 DIAGNOSIS — I10 Essential (primary) hypertension: Secondary | ICD-10-CM | POA: Diagnosis not present

## 2018-05-21 DIAGNOSIS — R519 Headache, unspecified: Secondary | ICD-10-CM

## 2018-05-21 DIAGNOSIS — Z7901 Long term (current) use of anticoagulants: Secondary | ICD-10-CM | POA: Diagnosis not present

## 2018-05-21 DIAGNOSIS — Z79899 Other long term (current) drug therapy: Secondary | ICD-10-CM | POA: Insufficient documentation

## 2018-05-21 DIAGNOSIS — R51 Headache: Secondary | ICD-10-CM | POA: Insufficient documentation

## 2018-05-21 LAB — CBC
HEMATOCRIT: 40.7 % (ref 36.0–46.0)
Hemoglobin: 12.8 g/dL (ref 12.0–15.0)
MCH: 29.7 pg (ref 26.0–34.0)
MCHC: 31.4 g/dL (ref 30.0–36.0)
MCV: 94.4 fL (ref 80.0–100.0)
Platelets: 271 10*3/uL (ref 150–400)
RBC: 4.31 MIL/uL (ref 3.87–5.11)
RDW: 13.6 % (ref 11.5–15.5)
WBC: 7.3 10*3/uL (ref 4.0–10.5)
nRBC: 0 % (ref 0.0–0.2)

## 2018-05-21 LAB — DIFFERENTIAL
Abs Immature Granulocytes: 0.02 10*3/uL (ref 0.00–0.07)
Basophils Absolute: 0.1 10*3/uL (ref 0.0–0.1)
Basophils Relative: 1 %
Eosinophils Absolute: 0.1 10*3/uL (ref 0.0–0.5)
Eosinophils Relative: 2 %
Immature Granulocytes: 0 %
LYMPHS ABS: 2.6 10*3/uL (ref 0.7–4.0)
Lymphocytes Relative: 35 %
MONOS PCT: 9 %
Monocytes Absolute: 0.7 10*3/uL (ref 0.1–1.0)
Neutro Abs: 3.9 10*3/uL (ref 1.7–7.7)
Neutrophils Relative %: 53 %

## 2018-05-21 LAB — COMPREHENSIVE METABOLIC PANEL
ALK PHOS: 83 U/L (ref 38–126)
ALT: 17 U/L (ref 0–44)
AST: 20 U/L (ref 15–41)
Albumin: 4.3 g/dL (ref 3.5–5.0)
Anion gap: 9 (ref 5–15)
BUN: 9 mg/dL (ref 8–23)
CO2: 25 mmol/L (ref 22–32)
Calcium: 9.6 mg/dL (ref 8.9–10.3)
Chloride: 105 mmol/L (ref 98–111)
Creatinine, Ser: 0.8 mg/dL (ref 0.44–1.00)
GFR calc Af Amer: >60 mL/min (ref >60)
GFR calc non Af Amer: >60 mL/min (ref >60)
Glucose, Bld: 102 mg/dL — ABNORMAL HIGH (ref 70–99)
Potassium: 4.3 mmol/L (ref 3.5–5.1)
Sodium: 139 mmol/L (ref 135–145)
Total Bilirubin: 1.4 mg/dL — ABNORMAL HIGH (ref 0.3–1.2)
Total Protein: 7.2 g/dL (ref 6.5–8.1)

## 2018-05-21 LAB — PROTIME-INR
INR: 1.06
Prothrombin Time: 13.7 seconds (ref 11.4–15.2)

## 2018-05-21 LAB — APTT: aPTT: 31 seconds (ref 24–36)

## 2018-05-21 MED ORDER — SODIUM CHLORIDE 0.9% FLUSH: 3.0000 mL | Freq: Once | INTRAVENOUS | Status: DC

## 2018-05-21 NOTE — Discharge Instructions (Addendum)
Please return for any problem.  Follow-up with your regular care provider as instructed. °

## 2018-05-21 NOTE — Progress Notes (Signed)
Cardiology Office Note   Date:  05/26/2018   ID:  Amanda Frost, DOB [birth year], MRN [id number]  PCP:  Amanda Infante, MD  Cardiologist:  Dr.Croitoru  Chief Complaint  Patient presents with  . Atrial Fibrillation     History of Present Illness: Amanda Frost is a 68 y.o. female who presents for ongoing assessment and management of PAF and paroxysmal atrial tachycardia, HTN, Non-Alcoholic steatohepatitis.   She has a history of rapid palpitations leading to ED visits. She has a history of DCCV, but not for over 3 years. She was given Flecainide in the ED and converted to NSR She remains on Eliquis.   A discussion was had with the patient by Dr. Sallyanne Kuster on last office visit on 11/22/2017 for referral to EP for ablation if she had recurrences of rapid HR within the next 12 months. She was to take an extra dose of metoprolol if she had recurrence of symptoms. She was to continue anticoagulation as directed.   She called our office on 05/19/2018 with complaints of shortness of breath occurring on and office for about a week. She reported that she is under a lot of stress at this time, and felt it may be related to anxiety. She denies similar symptoms that occur with Atrial fib.  She comes today with multiple questions about Atrial fib and her medications. She believes a lot of her symptoms are related to anxiety.  She currently is asymptomatic, medically compliant.   Past Medical History:  Diagnosis Date  . Hypertension   . Liver cirrhosis secondary to NASH (nonalcoholic steatohepatitis) ([place])   . Ocular migraine   . Palpitations    PAC's,PVC's,atrial tachycardia  . Paroxysmal atrial fibrillation [facility])     Past Surgical History:  Procedure Laterality Date  . BREAST BIOPSY  1994  . CARDIAC CATHETERIZATION  11/05/2007   normal LV fx,patent coronary arteries  . NM MYOVIEW LTD  03/12/2005   normal  . US ECHOCARDIOGRAPHY  11/26/2007   mild LVH,trace MR,TR     Current  Outpatient Medications  Medication Sig Dispense Refill  . adapalene (DIFFERIN) 0.1 % cream Apply 1 application topically daily.    . Ascorbic Acid (VITAMIN C) 100 MG tablet Take 100 mg by mouth daily.    . Cholecalciferol (VITAMIN D3) 5000 units CAPS Take 2 capsules by mouth every Monday, Tuesday, Wednesday, Thursday, and Friday.     Marland Kitchen ELIQUIS 5 MG TABS tablet TAKE 1 TABLET TWICE DAILY 180 tablet 1  . fexofenadine (ALLEGRA) 180 MG tablet Take 180 mg by mouth daily as needed for allergies or rhinitis.    . metoprolol succinate (TOPROL-XL) 50 MG 24 hr tablet Take 1 (50 mg) by mouth each morning and one (1/2) tablet (25 mg) by mouth each evening. 135 tablet 1  . trimethoprim (TRIMPEX) 100 MG tablet Take 1 tablet by mouth daily.     No current facility-administered medications for this visit.     Allergies:   Macrodantin [nitrofurantoin macrocrystal]; Ciprofloxacin; and Sulfa antibiotics    Social History:  The patient  reports that she has never smoked. She has never used smokeless tobacco. She reports that she does not drink alcohol or use drugs.   Family History:  The patient's family history includes Diabetes in her father and mother; Hypertension in her father and mother; Kidney disease in her mother.    ROS: All other systems are reviewed and negative. Unless otherwise mentioned in H&P    PHYSICAL EXAM: VS:  BP (!) 144/76   Pulse 62   Ht 5\' 8"  (1.727 m)   Wt 167 lb 6.4 oz (75.9 kg)   SpO2 98%   BMI 25.45 kg/m  , BMI Body mass index is 25.45 kg/m. GEN: Well nourished, well developed, in no acute distress HEENT: normal Neck: no JVD, carotid bruits, or masses Cardiac: RRR; no murmurs, rubs, or gallops,no edema  Respiratory:  Clear to auscultation bilaterally, normal work of breathing GI: soft, nontender, nondistended, + BS MS: no deformity or atrophy Skin: warm and dry, no rash Neuro:  Strength and sensation are intact Psych: euthymic mood, full affect   EKG:  NSR rate of  62 bpm.   Recent Labs: 05/21/2018: ALT 17; BUN 9; Creatinine, Ser 0.80; Hemoglobin 12.8; Platelets 271; Potassium 4.3; Sodium 139    Lipid Panel    Component Value Date/Time   CHOL  11/05/2007 0821    136        ATP III CLASSIFICATION:  <200     mg/dL   Desirable  200-239  mg/dL   Borderline High  >=240    mg/dL   High   TRIG 145 11/05/2007 0821   HDL 36 (L) 11/05/2007 0821   CHOLHDL 3.8 11/05/2007 0821   VLDL 29 11/05/2007 0821   LDLCALC  11/05/2007 0821    71        Total Cholesterol/HDL:CHD Risk Coronary Heart Disease Risk Table                     Men   Women  1/2 Average Risk   3.4   3.3      Wt Readings from Last 3 Encounters:  05/26/18 167 lb 6.4 oz (75.9 kg)  11/22/17 165 lb (74.8 kg)  11/22/17 165 lb (74.8 kg)      Other studies Reviewed: See scanned documents.   ASSESSMENT AND PLAN:  1. PAF: Multiple questions are answered concerning this diagnosis along with her medications and reasons for taking them. She is currently in NSR although she sometime fell "extra thumps" at night or when she is anxious. I will not make any changes in her medications at this time. She verbalized understanding concerning explanations on diagnosis and medications.   2. Hypertension:  BP is slightly elevated this am. Will continue to monitor to have a goal of 130/70.   3. Anxiety:  She states that her shortness of breath is related to her issues with anxiety. She hyperventilates when she is upset. 'She is on antianxiety medications currently which she feels are beneficial to her. See PCP for ongoing management.    Current medicines are reviewed at length with the patient today.  I have spent over 30 minutes with this patient in face to face interaction answering questions.   Labs/ tests ordered today include: None  Phill Myron. West Pugh, ANP, [facility]   05/26/2018 10:19 AM    [address] Office [phone] Fax [phone]

## 2018-05-21 NOTE — ED Provider Notes (Signed)
[place] EMERGENCY DEPARTMENT Provider Note   CSN: [id number] Arrival date & time: 05/21/18  1503    History   Chief Complaint Chief Complaint  Patient presents with  . Headache    HPI Amanda Frost is a 68 y.o. female.     68 year old female with prior medical history as detailed below presents for evaluation of reported headache.  Patient's headache is now resolved.  She reports acute onset of right-sided posterior headache.  Pain was described as sharp.  She is currently on Eliquis.  She is concerned about the possibility of an intracranial head bleed.  She denies any significant recent trauma.  She denies associated symptoms with her headache such as nausea, vomiting, visual change, weakness, or other acute complaint.  The history is provided by the patient and medical records.  Headache  Pain location:  R parietal Quality:  Sharp Radiates to:  Does not radiate Severity currently:  0/10 Severity at highest:  0/10 Onset quality:  Sudden Duration:  3 hours Timing:  Rare Progression:  Resolved Chronicity:  New Similar to prior headaches: no   Relieved by:  Nothing Worsened by:  Nothing Ineffective treatments:  None tried Associated symptoms: no fever, no neck stiffness, no numbness, no seizures, no syncope, no tingling, no visual change, no vomiting and no weakness     Past Medical History:  Diagnosis Date  . Hypertension   . Liver cirrhosis secondary to NASH (nonalcoholic steatohepatitis) ([place])   . Ocular migraine   . Palpitations    PAC's,PVC's,atrial tachycardia  . Paroxysmal atrial fibrillation [facility])     Patient Active Problem List   Diagnosis Date Noted  . Palpitations 10/15/2017  . Chronic anticoagulation 12/05/2016  . Paroxysmal atrial fibrillation ([place]) 01/21/2014  . Liver cirrhosis secondary to NASH ([place]) 01/21/2014  . Essential hypertension 10/29/2012  . Paroxysmal atrial tachycardia ([place]) 10/29/2012    Past Surgical  History:  Procedure Laterality Date  . BREAST BIOPSY  1994  . CARDIAC CATHETERIZATION  11/05/2007   normal LV fx,patent coronary arteries  . NM MYOVIEW LTD  03/12/2005   normal  . US ECHOCARDIOGRAPHY  11/26/2007   mild LVH,trace MR,TR     OB History   No obstetric history on file.      Home Medications    Prior to Admission medications   Medication Sig Start Date End Date Taking? Authorizing Provider  adapalene (DIFFERIN) 0.1 % cream Apply 1 application topically daily.   Yes [provider]  Ascorbic Acid (VITAMIN C) 100 MG tablet Take 100 mg by mouth daily.   Yes [provider]  Cholecalciferol (VITAMIN D3) 5000 units CAPS Take 2 capsules by mouth every Monday, Tuesday, Wednesday, Thursday, and Friday.    Yes [provider]  ELIQUIS 5 MG TABS tablet TAKE 1 TABLET TWICE DAILY Patient taking differently: Take 5 mg by mouth 2 (two) times daily.  04/17/18  Yes Croitoru, Mihai, MD  fexofenadine (ALLEGRA) 180 MG tablet Take 180 mg by mouth daily as needed for allergies or rhinitis.   Yes [provider]  metoprolol succinate (TOPROL-XL) 50 MG 24 hr tablet Take two (2) tablets (100 mg) by mouth each morning and one (1) tablet (50 mg) by mouth each evening. Patient taking differently: Take 25-50 mg by mouth See admin instructions. Take 50mg  by mouth each morning and 25mg  by mouth each evening. 01/03/18  Yes Croitoru, Mihai, MD  trimethoprim (TRIMPEX) 100 MG tablet Take 1 tablet by mouth daily. 11/29/16  Yes [provider]    Family History Family History  Problem Relation Age of Onset  . Diabetes Mother   . Hypertension Mother   . Kidney disease Mother   . Diabetes Father   . Hypertension Father     Social History Social History   Tobacco Use  . Smoking status: Never Smoker  . Smokeless tobacco: Never Used  Substance Use Topics  . Alcohol use: No    Alcohol/week: 0.0 standard drinks  . Drug use: No     Allergies   Macrodantin  [nitrofurantoin macrocrystal]; Ciprofloxacin; and Sulfa antibiotics   Review of Systems Review of Systems  Constitutional: Negative for fever.  Cardiovascular: Negative for syncope.  Gastrointestinal: Negative for vomiting.  Musculoskeletal: Negative for neck stiffness.  Neurological: Positive for headaches. Negative for seizures, weakness and numbness.  All other systems reviewed and are negative.    Physical Exam Updated Vital Signs BP (!) 154/71   Pulse 64   Temp 97.8 F (36.6 C) (Oral)   Resp 20   SpO2 95%   Physical Exam Vitals signs and nursing note reviewed.  Constitutional:      General: She is not in acute distress.    Appearance: She is well-developed.  HENT:     Head: Normocephalic and atraumatic.  Eyes:     General: No visual field deficit.    Conjunctiva/sclera: Conjunctivae normal.     Pupils: Pupils are equal, round, and reactive to light.  Neck:     Musculoskeletal: Normal range of motion and neck supple.  Cardiovascular:     Rate and Rhythm: Normal rate and regular rhythm.     Heart sounds: Normal heart sounds.  Pulmonary:     Effort: Pulmonary effort is normal. No respiratory distress.     Breath sounds: Normal breath sounds.  Abdominal:     General: There is no distension.     Palpations: Abdomen is soft.     Tenderness: There is no abdominal tenderness.  Musculoskeletal: Normal range of motion.        General: No deformity.  Skin:    General: Skin is warm and dry.  Neurological:     Mental Status: She is alert and oriented to person, place, and time. Mental status is at baseline.     GCS: GCS eye subscore is 4. GCS verbal subscore is 5. GCS motor subscore is 6.     Cranial Nerves: No cranial nerve deficit, dysarthria or facial asymmetry.     Sensory: No sensory deficit.     Motor: No weakness.     Coordination: Romberg sign negative. Coordination normal.      ED Treatments / Results  Labs (all labs ordered are listed, but only  abnormal results are displayed) Labs Reviewed  COMPREHENSIVE METABOLIC PANEL - Abnormal; Notable for the following components:      Result Value   Glucose, Bld 102 (*)    Total Bilirubin 1.4 (*)    All other components within normal limits  PROTIME-INR  APTT  CBC  DIFFERENTIAL    EKG EKG Interpretation  Date/Time:  Wednesday May 21 2018 15:20:01 EST Ventricular Rate:  67 PR Interval:  150 QRS Duration: 88 QT Interval:  410 QTC Calculation: 433 R Axis:   39 Text Interpretation:  Normal sinus rhythm Normal ECG Confirmed by Dene Gentry [phone]) on 05/21/2018 4:13:34 PM   Radiology Ct Head Wo Contrast  Result Date: 05/21/2018 CLINICAL DATA:  68 y/o F; pain to the back right-sided  head on and off for the past 3 hours. EXAM: CT HEAD WITHOUT CONTRAST TECHNIQUE: Contiguous axial images were obtained from the base of the skull through the vertex without intravenous contrast. COMPARISON:  05/06/2017 CT head FINDINGS: Brain: No evidence of acute infarction, hemorrhage, hydrocephalus, extra-axial collection or mass lesion/mass effect. Vascular: No hyperdense vessel or unexpected calcification. Skull: Normal. Negative for fracture or focal lesion. Sinuses/Orbits: No acute finding. Other: None. IMPRESSION: Stable negative CT of the head. Electronically Signed   By: Kristine Garbe M.D.   On: 05/21/2018 15:57    Procedures Procedures (including critical care time)  Medications Ordered in ED Medications  sodium chloride flush (NS) 0.9 % injection 3 mL (has no administration in time range)     Initial Impression / Assessment and Plan / ED Course  I have reviewed the triage vital signs and the nursing notes.  Pertinent labs & imaging results that were available during my care of the patient were reviewed by me and considered in my medical decision making (see chart for details).       MDM  Screen complete  Patient is presenting for evaluation of reported transient  headache.  Patient's described symptoms not consistent with more concerning pathology such as intracranial bleed, CVA, or other acute pathology.  Workup today including CT of the head is without evidence of significant acute pathology.  Patient is asymptomatic at time of my evaluation.    She does understand need for close follow-up.  Strict return precautions given and understood.  Final Clinical Impressions(s) / ED Diagnoses   Final diagnoses:  Nonintractable headache, unspecified chronicity pattern, unspecified headache type    ED Discharge Orders    None       Valarie Merino, MD 05/21/18 1656

## 2018-05-21 NOTE — ED Triage Notes (Signed)
C/o intermittent pain to R side of head since 12pm today.  Denies weakness or any other symptoms.  VAN negative.  No arm drift.  Pt denies pain at present.  Merry Proud, [place] notified of pt and will do stroke work-up without activating Code Stroke.  PT takes Eliquis.

## 2018-05-21 NOTE — ED Notes (Signed)
Declined W/C at D/C and was escorted to lobby by RN. 

## 2018-05-26 ENCOUNTER — Encounter: Payer: Self-pay | Admitting: Adult Health

## 2018-05-26 ENCOUNTER — Ambulatory Visit (INDEPENDENT_AMBULATORY_CARE_PROVIDER_SITE_OTHER): Payer: PPO | Admitting: Adult Health

## 2018-05-26 VITALS — BP 144/76 | HR 62 | Ht 68.0 in | Wt 167.4 lb

## 2018-05-26 DIAGNOSIS — I480 Paroxysmal atrial fibrillation: Secondary | ICD-10-CM | POA: Diagnosis not present

## 2018-05-26 DIAGNOSIS — R0602 Shortness of breath: Secondary | ICD-10-CM | POA: Diagnosis not present

## 2018-05-26 MED ORDER — METOPROLOL SUCCINATE ER 50 MG PO TB24: ORAL_TABLET | ORAL | 1 refills | Status: DC

## 2018-05-26 NOTE — Patient Instructions (Signed)
Follow-Up: You will need a follow up appointment in 6 months.  Please call our office 2 months in advance (June 2020)to schedule this (AUGUST 2020) appointment.  You may see Sanda Klein, MD Jory Sims, DNP, [place] or one of the following Advanced Practice Providers on your designated Care Team:  Almyra Deforest, PA-C  Fabian Sharp, [place]       Medication Instructions:  NO CHANGES- Your physician recommends that you continue on your current medications as directed. Please refer to the Current Medication list given to you today. If you need a refill on your cardiac medications before your next appointment, please call your pharmacy. Labwork: When you have labs (blood work) and your tests are completely normal, you will receive your results ONLY by [place] (if you have MyChart) -OR- A paper copy in the mail.  At [facility], you and your health needs are our priority.  As part of our continuing mission to provide you with exceptional heart care, we have created designated Provider Care Teams.  These Care Teams include your primary Cardiologist (physician) and Advanced Practice Providers (APPs -  Physician Assistants and Nurse Practitioners) who all work together to provide you with the care you need, when you need it.  Thank you for choosing [facility]!!

## 2018-05-26 NOTE — Progress Notes (Signed)
Anxiety has always been a prominent component of her presentations.  I agree with no changes to her medications. MCr

## 2018-05-27 DIAGNOSIS — N3000 Acute cystitis without hematuria: Secondary | ICD-10-CM | POA: Diagnosis not present

## 2018-06-04 ENCOUNTER — Other Ambulatory Visit: Payer: Self-pay | Admitting: Obstetrics and Gynecology

## 2018-06-04 ENCOUNTER — Ambulatory Visit
Admission: RE | Admit: 2018-06-04 | Discharge: 2018-06-04 | Disposition: A | Discharge status: Home / Self Care | Payer: PPO | Source: Ambulatory Visit | Attending: Obstetrics and Gynecology | Admitting: Obstetrics and Gynecology

## 2018-06-04 DIAGNOSIS — R921 Mammographic calcification found on diagnostic imaging of breast: Secondary | ICD-10-CM

## 2018-06-10 ENCOUNTER — Other Ambulatory Visit: Payer: Self-pay | Admitting: Obstetrics and Gynecology

## 2018-07-08 DIAGNOSIS — R42 Dizziness and giddiness: Secondary | ICD-10-CM | POA: Diagnosis not present

## 2018-07-08 DIAGNOSIS — D649 Anemia, unspecified: Secondary | ICD-10-CM | POA: Diagnosis not present

## 2018-07-08 DIAGNOSIS — I480 Paroxysmal atrial fibrillation: Secondary | ICD-10-CM | POA: Diagnosis not present

## 2018-07-08 DIAGNOSIS — Z79899 Other long term (current) drug therapy: Secondary | ICD-10-CM | POA: Diagnosis not present

## 2018-07-08 DIAGNOSIS — J302 Other seasonal allergic rhinitis: Secondary | ICD-10-CM | POA: Diagnosis not present

## 2018-08-13 DIAGNOSIS — I480 Paroxysmal atrial fibrillation: Secondary | ICD-10-CM | POA: Diagnosis not present

## 2018-08-13 DIAGNOSIS — R51 Headache: Secondary | ICD-10-CM | POA: Diagnosis not present

## 2018-08-13 DIAGNOSIS — Z7901 Long term (current) use of anticoagulants: Secondary | ICD-10-CM | POA: Diagnosis not present

## 2018-08-13 DIAGNOSIS — I10 Essential (primary) hypertension: Secondary | ICD-10-CM | POA: Diagnosis not present

## 2018-08-22 ENCOUNTER — Telehealth: Payer: Self-pay | Admitting: Cardiovascular Disease

## 2018-08-22 NOTE — Telephone Encounter (Signed)
As long as she is having minor dental work- like a cleaning, 1-2 tooth extraction or filling she does not need to hold her Eliquis.

## 2018-08-22 NOTE — Telephone Encounter (Signed)
Yes, as long as it is not more than 2 teeth being extracted, no need to hold.

## 2018-08-22 NOTE — Telephone Encounter (Signed)
Patient called stating she is having dental work done on Tuesday, she wants to know if she needs to stop her Eliquis prior to that.  She said the dentist told her no, but she wants to make sure.

## 2018-08-22 NOTE — Telephone Encounter (Signed)
Pt notified of [place] further directions. She wil CB with any adverse reactions.

## 2018-08-22 NOTE — Telephone Encounter (Signed)
Returning call to pt she states that she is having an extraction, post and a crown(implant), maybe more might be needed. Still no need to hold Eliquis?

## 2018-08-26 ENCOUNTER — Telehealth: Payer: Self-pay | Admitting: Cardiovascular Disease

## 2018-08-26 NOTE — Telephone Encounter (Signed)
New message   Patient had dental work done and wants to know if she can take an ibuprofen along with the eliquis. Please advise.

## 2018-08-26 NOTE — Telephone Encounter (Signed)
Returned call to patient. She had dental work this am and was recommended to take ibuprofen 600 mg tid for pain/inflammation.  Pt on Eliquis and was unsure if the combination was safe.  Assured patient that she could take some ibuprofen, but I would only recommend 600 mg bid for up to 4 doses total.  Explained increased risk for cardiothrombotic events as well as increased risk for GI bleed.  Pt to take ibuprofen with food.  She states she will take today and determine tomorrow if she will need any further doses.

## 2018-09-09 ENCOUNTER — Telehealth: Payer: Self-pay | Admitting: Cardiovascular Disease

## 2018-09-09 NOTE — Telephone Encounter (Signed)
The patient stated that she recently had a dental procedure done and was put on antibiotics. Due to the antibiotics she has gotten a yeast infection and is now prescribed Fluconazole. She read online that it can cause a racing heart. She wanted to make sure it was safe to take.

## 2018-09-09 NOTE — Telephone Encounter (Signed)
New Message      Pt c/o medication issue:  1. Name of Medication: Fluconazole   2. How are you currently taking this medication (dosage and times per day)? Not taking it yet    3. Are you having a reaction (difficulty breathing--STAT)? No   4. What is your medication issue? Pt developed a yeast infection from an antibiotic and was prescribed to take this medication and she is calling to make sure it is okay that she takes it

## 2018-09-09 NOTE — Telephone Encounter (Signed)
She should take the prescribed fluconazole - very unlikely to give her a problem with arrhythmia. Only an issue for patients with certain underlying heart conditions or taking certain cardiac meds; not her case.

## 2018-09-10 NOTE — Telephone Encounter (Signed)
The patient has been made aware of the recommendations and verbalized her understanding.

## 2018-09-10 NOTE — Telephone Encounter (Signed)
Left a message for the patient to call back.  

## 2018-09-30 ENCOUNTER — Ambulatory Visit: Payer: PPO | Admitting: Neurology

## 2018-11-05 ENCOUNTER — Telehealth: Payer: Self-pay | Admitting: Cardiovascular Disease

## 2018-11-05 ENCOUNTER — Other Ambulatory Visit: Payer: Self-pay | Admitting: Cardiovascular Disease

## 2018-11-05 NOTE — Telephone Encounter (Signed)
Called pt she states that the pharmacy told her that it was refill too [employer] they state that Eliquis is ready for her to p/u but her co-pay is $350 her deductible started again in June.  Pt notified she will call and discuss with pharnacy

## 2018-11-05 NOTE — Telephone Encounter (Signed)
25f 75.9kg Scr 0.8 05/21/18 [facility]/[place] 05/26/18

## 2018-11-05 NOTE — Telephone Encounter (Signed)
New message   Patient calling the office for samples of medication:   1.  What medication and dosage are you requesting samples for?ELIQUIS 5 MG TABS tablet  2.  Are you currently out of this medication? No, patient has a couple of pills left

## 2018-11-07 ENCOUNTER — Ambulatory Visit: Payer: PPO | Admitting: Podiatry

## 2018-11-07 ENCOUNTER — Other Ambulatory Visit: Payer: Self-pay

## 2018-11-07 ENCOUNTER — Ambulatory Visit (INDEPENDENT_AMBULATORY_CARE_PROVIDER_SITE_OTHER): Payer: PPO

## 2018-11-07 ENCOUNTER — Encounter: Payer: Self-pay | Admitting: Podiatry

## 2018-11-07 VITALS — BP 104/81 | Temp 97.3°F

## 2018-11-07 DIAGNOSIS — T148XXA Other injury of unspecified body region, initial encounter: Secondary | ICD-10-CM

## 2018-11-07 DIAGNOSIS — M7662 Achilles tendinitis, left leg: Secondary | ICD-10-CM

## 2018-11-07 MED ORDER — NITROGLYCERIN 0.2 MG/HR TD PT24: 0.2000 mg | MEDICATED_PATCH | Freq: Every day | TRANSDERMAL | 12 refills | Status: DC

## 2018-11-12 NOTE — Progress Notes (Signed)
Subjective:   Patient ID: Amanda Frost, female   DOB: 68 y.o.   MRN: [id number]   HPI Patient presents with a lot of pain of the back of the left heel and states that it is making it hard for her to be active and it is been going on for several months and gradually worsening over that time.  Patient does not smoke and would like to be more active   Review of Systems  All other systems reviewed and are negative.       Objective:  Physical Exam Vitals signs and nursing note reviewed.  Constitutional:      Appearance: She is well-developed.  Pulmonary:     Effort: Pulmonary effort is normal.  Musculoskeletal: Normal range of motion.  Skin:    General: Skin is warm.  Neurological:     Mental Status: She is alert.     Neurovascular status intact muscle strength is adequate range of motion within normal limits with patient found to have exquisite discomfort at the muscle tendon junction of the left heel with pain and no indication of tendon dysfunction with mild equinus condition.  Patient is found to have good digital perfusion well oriented x3     Assessment:  Achilles tendinitis left with muscle tendon involvement that is quite sore and making it hard for the patient to be comfortable.  Patient states that it has gradually gotten worse     Plan:  X-ray and H&P reviewed with patient and discussed muscle tendon junction Achilles tendinitis and that it could be a pre-tear kind of situation we need to be very careful.  At this point I have recommended complete immobilization and applied air fracture walker and patient tolerated well with all instructions on usage and patient will wear this for 2 weeks and then slowly try to reverse it and will be seen back again in 3 weeks.  May require other treatment depending on the response to immobilization and I did instruct heat ice therapy and gradual stretching  X-ray indicates minimal spur formation no indications of other pathology  associated with this

## 2018-11-18 ENCOUNTER — Ambulatory Visit: Payer: PPO | Admitting: Neurology

## 2018-11-18 ENCOUNTER — Encounter: Payer: Self-pay | Admitting: Neurology

## 2018-11-18 ENCOUNTER — Other Ambulatory Visit: Payer: Self-pay

## 2018-11-18 VITALS — BP 150/80 | HR 68 | Temp 99.1°F | Ht 68.0 in | Wt 175.0 lb

## 2018-11-18 DIAGNOSIS — M7918 Myalgia, other site: Secondary | ICD-10-CM | POA: Diagnosis not present

## 2018-11-18 DIAGNOSIS — M5481 Occipital neuralgia: Secondary | ICD-10-CM | POA: Insufficient documentation

## 2018-11-18 NOTE — Patient Instructions (Addendum)
- suggested trying flexeril at bedtime - She prefers no medications, will try Therapy and conservative measures - Dry needling for cervical myofascial pain possibly - [address] - good posture, her posture and working on the computer and phone is a trigger, or trying a soft cervical collar, ergonomic workspace - Occipital Nerve Blocks - consider in the future if needed - If above does not improve the headache, consider MRI cervical spine   Occipital Neuralgia  Occipital neuralgia is a type of headache that causes brief episodes of very bad pain in the back of your head. Pain from occipital neuralgia may spread (radiate) to other parts of your head. These headaches may be caused by irritation of the nerves that leave your spinal cord high up in your neck, just below the base of your skull (occipital nerves). Your occipital nerves transmit sensations from the back of your head, the top of your head, and the areas behind your ears. What are the causes? This condition can occur without any known cause (primary headache syndrome). In other cases, this condition is caused by pressure on or irritation of one of the two occipital nerves. Pressure and irritation may be due to:  Muscle spasm in the neck.  Neck injury.  Wear and tear of the vertebrae in the neck (osteoarthritis).  Disease of the disks that separate the vertebrae.  Swollen blood vessels that put pressure on the occipital nerves.  Infections.  Tumors.  Diabetes. What are the signs or symptoms? This condition causes brief burning, stabbing, electric, shocking, or shooting pain which can radiate to the top of the head. It can happen on one side or both sides of the head. It can also cause:  Pain behind the eye.  Pain triggered by neck movement or hair brushing.  Scalp tenderness.  Aching in the back of the head between episodes of very bad pain.  Pain gets worse with exposure to bright lights. How is this diagnosed?  There is no test that diagnoses this condition. Your health care provider may diagnose this condition based on a physical exam and your symptoms. Other tests may be done, such as:  Imaging studies of the brain and neck (cervical spine), such as an MRI or CT scan. These look for causes of pinched nerves.  Applying pressure to the nerves in the neck to try to re-create the pain.  Injection of numbing medicine into the occipital nerve areas to see if pain goes away (diagnostic nerve block). How is this treated? Treatment for this condition may begin with simple measures, such as:  Rest.  Massage.  Applying heat or cold on the area.  Over-the-counter pain relievers. If these measures do not work, you may need other treatments, including:  Medicines, such as: ? Prescription-strength anti-inflammatory medicines. ? Muscle relaxants. ? Anti-seizure medicines, which can relieve pain. ? Antidepressants, which can relieve pain. ? Injected medicines, such as medicines that numb the area (local anesthetic) and steroids.  Pulsed radiofrequency ablation. This is when wires are implanted to deliver electrical impulses that block pain signals from the occipital nerve.  Surgery to relieve nerve pressure.  Physical therapy. Follow these instructions at home: Pain management      Avoid any activities that cause pain.  Rest when you have an attack of pain.  Try gentle massage to relieve pain.  Try a different pillow or sleeping position.  If directed, apply heat to the affected area as told by your health care provider. Use the heat  source that your health care provider recommends, such as a moist heat pack or a heating pad. ? Place a towel between your skin and the heat source. ? Leave the heat on for 20-30 minutes. ? Remove the heat if your skin turns bright red. This is especially important if you are unable to feel pain, heat, or cold. You may have a greater risk of getting burned.   If directed, apply ice to the back of the head and neck area as told by your health care provider. ? Put ice in a plastic bag. ? Place a towel between your skin and the bag. ? Leave the ice on for 20 minutes, 2-3 times per day. General instructions  Take over-the-counter and prescription medicines only as told by your health care provider.  Avoid things that make your symptoms worse, such as bright lights.  Try to stay active. Get regular exercise that does not cause pain. Ask your health care provider to suggest safe exercises for you.  Work with a physical therapist to learn stretching exercises you can do at home.  Practice good posture.  Keep all follow-up visits as told by your health care provider. This is important. Contact a health care provider if:  Your medicine is not working.  You have new or worsening symptoms. Get help right away if:  You have very bad head pain that does not go away.  You have a sudden change in vision, balance, or speech. Summary  Occipital neuralgia is a type of headache that causes brief episodes of very bad pain in the back of your head.  Pain from occipital neuralgia may spread (radiate) to other parts of your head.  Treatment for this condition includes rest, massage, and medicines. This information is not intended to replace advice given to you by your health care provider. Make sure you discuss any questions you have with your health care provider. Document Released: 03/13/2001 Document Revised: 03/05/2017 Document Reviewed: 05/24/2016 Elsevier Patient Education  [place].   Occipital Nerve Block Patient Information  Description: The occipital nerves originate in the cervical (neck) spinal cord and travel upward through muscle and tissue to supply sensation to the back of the head and top of the scalp.  In addition, the nerves control some of the muscles of the scalp.  Occipital neuralgia is an irritation of these nerves  which can cause headaches, numbness of the scalp, and neck discomfort.     The occipital nerve block will interrupt nerve transmission through these nerves and can relieve pain and spasm.  The block consists of insertion of a small needle under the skin in the back of the head to deposit local anesthetic (numbing medicine) and/or steroids around the nerve.  The entire block usually lasts less than 5 minutes.  Conditions which may be treated by occipital blocks:   Muscular pain and spasm of the scalp  Nerve irritation, back of the head  Headaches  Upper neck pain  Preparation for the injection:  1. Do not eat any solid food or dairy products within 8 hours of your appointment. 2. You may drink clear liquids up to 3 hours before appointment.  Clear liquids include water, black coffee, juice or soda.  No milk or cream please. 3. You may take your regular medication, including pain medications, with a sip of water before you appointment.  Diabetics should hold regular insulin (if taken separately) and take 1/2 normal NPH dose the morning of the procedure.  Carry some sugar containing items with you to your appointment. 4. A driver must accompany you and be prepared to drive you home after your procedure. 5. Bring all your current medications with you. 6. An IV may be inserted and sedation may be given at the discretion of the physician. 7. A blood pressure cuff, EKG, and other monitors will often be applied during the procedure.  Some patients may need to have extra oxygen administered for a short period. 8. You will be asked to provide medical information, including your allergies and medications, prior to the procedure.  We must know immediately if you are taking blood thinners (like Coumadin/Warfarin) or if you are allergic to IV iodine contrast (dye).  We must know if you could possible be pregnant.  9. Do not wear a high collared shirt or turtleneck.  Tie long hair up in the back if  possible.  Possible side-effects:   Bleeding from needle site  Infection (rare, may require surgery)  Nerve injury (rare)  Hair on back of neck can be tinged with iodine scrub (this will wash out)  Light-headedness (temporary)  Pain at injection site (several days)  Decreased blood pressure (rare, temporary)  Seizure (very rare)  Call if you experience:   Hives or difficulty breathing ( go to the emergency room)  Inflammation or drainage at the injection site(s)  Please note:  Although the local anesthetic injected can often make your painful muscles or headache feel good for several hours after the injection, the pain may return.  It takes 3-7 days for steroids to work.  You may not notice any pain relief for at least one week.  If effective, we will often do a series of injections spaced 3-6 weeks apart to maximally decrease your pain.  If you have any questions, please call [phone] [facility]

## 2018-11-18 NOTE — Progress Notes (Signed)
[facility]    Provider:  Dr Jaynee Eagles Requesting Provider: Crist Infante, MD Primary Care Provider:  Crist Infante, MD  CC:  Headaches  HPI:  Amanda Frost is a 68 y.o. female here as requested by Crist Infante, MD for headaches.  She has a past medical history of paroxysmal atrial tachycardia and fibrillation, ocular migraine, hypertension, liver cirrhosis secondary to [facility], on chronic anticoagulation, anxiety and panic attacks, remote ocular migraines, menopause age 31, heart murmur, osteoporosis November 2018. Headaches started this past spring April 2020  in the setting of computer work and this time it started again with increased computer use. It lasted 2 weeks last year and it was fine. The headaches came back 4 days ago. Tylenol helps in 45 minutes. Feels like dull ache in the right occipital, then throbbing 9not sharp, not shooting, not burning), moving neck to the right worsens it, she has soreness (point to the emergence of the occipital nerve), no light/sound sensitivity, no nausea or vomiting or other migrainous features. It happens when waking or can happen when working on the computer all day, it can last at least 3-4 hours. The last was yesterday and the day before, she denies radiation to the eye or temple but her eyes do not feel tired.   Reviewed notes, labs and imaging from outside physicians, which showed:   I reviewed Dr. Tempie Donning notes.  Patient complaining of posterior headaches, eye pain on the right, sometimes the pain extends to the neck, she feels that she is being hit with a hammer, can last up to 45 minutes, sporadic, Tylenol, she feels a stabbing pain.  Headaches are intermittent every couple of days typically at night and then late morning several times a week.  Accompanied with neck tightness.  Typically right cervical pain is stabbing intermittent and residual aching.  Last 20 to 30 minutes.  Last bad headache was 2 days ago.  Multiple mild aching  headaches today.  Worse with lateral neck movement.  Typically relieved with Tylenol.  History of ocular migraines without pain, last one was 3 years ago.  Denied fevers, chills, vision changes, neurologic deficits, chest pain, shortness of breath, nausea or vomiting.  Dr. Haynes Kerns suspected tension in the neck shoulder regions, prescribed cyclobenzaprine as needed for pain and spasm, Tylenol as needed acutely, alternate cold and heat for 20 minutes each day every 2 hours while awake, gave her neck stretches, and referred her to neurology.  I reviewed emergency room notes from February 2020.  Patient presented complaining of intermittent pain to the right side of head.  She denied any other associated symptoms such as weakness.  No arm drift.  She reported headache which was resolved in the emergency room.  Acute onset of right sided posterior headache.  Pain was described as sharp.  Patient, being on Eliquis, was worried about up possible intracranial head bleed.  There were no other associated symptoms with her headache such as nausea, vomiting, visual change, weakness or other acute complaint.  It lasted for 3 hours.  It was not similar to prior headaches this was new.  CT of the head was negative and since patient was asymptomatic at the time she was discharged home.  CT 05/2018 showed No acute intracranial abnormalities including mass lesion or mass effect, hydrocephalus, extra-axial fluid collection, midline shift, hemorrhage, or acute infarction, large ischemic events (personally reviewed images)    Review of Systems: Patient complains of symptoms per HPI as well as the following symptoms ,.  Pertinent negatives and positives per HPI. All others negative.   Social History   Socioeconomic History   Marital status: Married    Spouse name: Not on file   Number of children: 4   Years of education: Not on file   Highest education level: Some college, no degree  Occupational History   Not on  file  Social Needs   Financial resource strain: Not on file   Food insecurity    Worry: Not on file    Inability: Not on file   Transportation needs    Medical: Not on file    Non-medical: Not on file  Tobacco Use   Smoking status: Never Smoker   Smokeless tobacco: Never Used  Substance and Sexual Activity   Alcohol use: No    Alcohol/week: 0.0 standard drinks   Drug use: No   Sexual activity: Not on file  Lifestyle   Physical activity    Days per week: Not on file    Minutes per session: Not on file   Stress: Not on file  Relationships   Social connections    Talks on phone: Not on file    Gets together: Not on file    Attends religious service: Not on file    Active member of club or organization: Not on file    Attends meetings of clubs or organizations: Not on file    Relationship status: Not on file   Intimate partner violence    Fear of current or ex partner: Not on file    Emotionally abused: Not on file    Physically abused: Not on file    Forced sexual activity: Not on file  Other Topics Concern   Not on file  Social History Narrative   Pt lives in [place] with spouse and 1 yr old granddaughter.  Retired Control and instrumentation engineer (kindergarten)   Right handed   Caffeine: tea, 1 cup/day    Family History  Problem Relation Age of Onset   Diabetes Mother    Hypertension Mother    Kidney disease Mother    Diabetes Father    Hypertension Father    Migraines Maternal Grandmother    Migraines Son        childhood   Migraines Son        childhood    Past Medical History:  Diagnosis Date   Hypertension    Liver cirrhosis secondary to NASH (nonalcoholic steatohepatitis) ([place])    Ocular migraine    Palpitations    PAC's,PVC's,atrial tachycardia   Paroxysmal atrial fibrillation [facility])     Patient Active Problem List   Diagnosis Date Noted   Cervico-occipital neuralgia of right side 11/18/2018   Palpitations 10/15/2017    Chronic anticoagulation 12/05/2016   Paroxysmal atrial fibrillation ([place]) 01/21/2014   Liver cirrhosis secondary to NASH ([place]) 01/21/2014   Essential hypertension 10/29/2012   Paroxysmal atrial tachycardia ([place]) 10/29/2012    Past Surgical History:  Procedure Laterality Date   BREAST BIOPSY  1994   CARDIAC CATHETERIZATION  11/05/2007   normal LV fx,patent coronary arteries   NM MYOVIEW LTD  03/12/2005   normal   US ECHOCARDIOGRAPHY  11/26/2007   mild LVH,trace MR,TR    Current Outpatient Medications  Medication Sig Dispense Refill   adapalene (DIFFERIN) 0.1 % cream Apply 1 application topically daily.     Ascorbic Acid (VITAMIN C) 100 MG tablet Take 100 mg by mouth daily.     cephALEXin (KEFLEX) 250 MG capsule Take 250  mg by mouth daily.     Cholecalciferol (VITAMIN D3) 5000 units CAPS Take 2 capsules by mouth every Monday, Tuesday, Wednesday, Thursday, and Friday.      ELIQUIS 5 MG TABS tablet TAKE 1 TABLET BY MOUTH TWICE A DAY 180 tablet 1   fexofenadine (ALLEGRA) 180 MG tablet Take 180 mg by mouth daily as needed for allergies or rhinitis.     metoprolol succinate (TOPROL-XL) 50 MG 24 hr tablet Take 1 (50 mg) by mouth each morning and one (1/2) tablet (25 mg) by mouth each evening. 135 tablet 1   nitroGLYCERIN (NITRO-DUR) 0.2 mg/hr patch Place 1 patch (0.2 mg total) onto the skin daily. (Patient not taking: Reported on 11/18/2018) 30 patch 12   No current facility-administered medications for this visit.     Allergies as of 11/18/2018 - Review Complete 11/18/2018  Allergen Reaction Noted   Macrodantin [nitrofurantoin macrocrystal] Hives 02/12/2015   Ciprofloxacin Other (See Comments) 04/03/2016   Sulfa antibiotics Other (See Comments) and Hives 03/25/2012    Vitals: BP (!) 150/80 (BP Location: Right Arm, Patient Position: Sitting)    Pulse 68    Temp 99.1 F (37.3 C)    Ht 5\' 8"  (1.727 m)    Wt 175 lb (79.4 kg)    BMI 26.61 kg/m  Last Weight:  Wt Readings  from Last 1 Encounters:  11/18/18 175 lb (79.4 kg)   Last Height:   Ht Readings from Last 1 Encounters:  11/18/18 5\' 8"  (1.727 m)     Physical exam: Exam: Gen: NAD, conversant, well nourised, well groomed                     CV: RRR, no MRG. No Carotid Bruits. No peripheral edema, warm, nontender Eyes: Conjunctivae clear without exudates or hemorrhage  Neuro: Detailed Neurologic Exam  Speech:    Speech is normal; fluent and spontaneous with normal comprehension.  Cognition:    The patient is oriented to person, place, and time;     recent and remote memory intact;     language fluent;     normal attention, concentration,     fund of knowledge Cranial Nerves:    The pupils are equal, round, and reactive to light. The fundi are flat.. Visual fields are full to finger confrontation. Extraocular movements are intact. Trigeminal sensation is intact and the muscles of mastication are normal. The face is symmetric. The palate elevates in the midline. Hearing intact. Voice is normal. Shoulder shrug is normal. The tongue has normal motion without fasciculations.   Coordination:    Normal finger to nose and heel to shin.   Gait:    Heel-toe and tandem gait are normal.   Motor Observation:    No asymmetry, no atrophy, and no involuntary movements noted. Tone:    Normal muscle tone.    Posture:    Posture is normal. normal erect    Strength:    Strength is V/V in the upper and lower limbs.      Sensation: intact to LT     Reflex Exam:  DTR's:    Deep tendon reflexes in the upper and lower extremities are normal bilaterally.   Toes:    The toes are downgoing bilaterally.   Clonus:    Clonus is absent.    Assessment/Plan:   [age] year old with occpital head pain on the right in the setting of more computer and phone use. Feels like dull ache in the  right occipital area, moving neck to the right worsens it, she has soreness (point to the emergence of the occipital  nerve), no light/sound sensitivity, no nausea or vomiting or other migrainous features. Likely Cervico-occipital neuralgia.   - She did not take the flexeril yet, suggested trying it at bedtime - She prefers no medications, will try Therapy and conservative measures - Dry needling for cervical myofascial pain - [address] - Discussed good posture, her posture and working on the computer and phone is a trigger, or trying a soft cervical collar, ergonomic workspace - Occipital Nerve Blocks - consider in the future if needed - If above does not improve the headache, consider MRI cervical spine    Orders Placed This Encounter  Procedures   Ambulatory referral to Physical Therapy    Cc: Crist Infante, MD,    Sarina Ill, MD  [facility] [address] [place], [place] [address]  Phone [phone] Fax [phone]  A total of 60 minutes was spent face-to-face with this patient. Over half this time was spent on counseling patient on the  1. Cervico-occipital neuralgia of right side   2. Cervical myofascial pain syndrome    diagnosis and different diagnostic and therapeutic options, counseling and coordination of care, risks ans benefits of management, compliance, or risk factor reduction and education.

## 2018-11-26 ENCOUNTER — Ambulatory Visit: Payer: PPO | Admitting: Physical Therapy

## 2018-11-28 ENCOUNTER — Ambulatory Visit: Payer: PPO | Admitting: Podiatry

## 2018-12-09 ENCOUNTER — Other Ambulatory Visit: Payer: Self-pay

## 2018-12-09 ENCOUNTER — Ambulatory Visit
Admission: RE | Admit: 2018-12-09 | Discharge: 2018-12-09 | Disposition: A | Discharge status: Home / Self Care | Payer: PPO | Source: Ambulatory Visit | Attending: Obstetrics and Gynecology | Admitting: Obstetrics and Gynecology

## 2018-12-09 DIAGNOSIS — R921 Mammographic calcification found on diagnostic imaging of breast: Secondary | ICD-10-CM

## 2018-12-09 DIAGNOSIS — R922 Inconclusive mammogram: Secondary | ICD-10-CM | POA: Diagnosis not present

## 2018-12-10 ENCOUNTER — Ambulatory Visit: Payer: PPO | Admitting: Physical Therapy

## 2018-12-13 DIAGNOSIS — Z23 Encounter for immunization: Secondary | ICD-10-CM | POA: Diagnosis not present

## 2018-12-15 DIAGNOSIS — Z01419 Encounter for gynecological examination (general) (routine) without abnormal findings: Secondary | ICD-10-CM | POA: Diagnosis not present

## 2018-12-15 DIAGNOSIS — Z6827 Body mass index (BMI) 27.0-27.9, adult: Secondary | ICD-10-CM | POA: Diagnosis not present

## 2018-12-17 ENCOUNTER — Other Ambulatory Visit: Payer: Self-pay | Admitting: Adult Health

## 2019-01-23 DIAGNOSIS — R350 Frequency of micturition: Secondary | ICD-10-CM | POA: Diagnosis not present

## 2019-01-23 DIAGNOSIS — N3941 Urge incontinence: Secondary | ICD-10-CM | POA: Diagnosis not present

## 2019-01-23 DIAGNOSIS — N3000 Acute cystitis without hematuria: Secondary | ICD-10-CM | POA: Diagnosis not present

## 2019-03-25 NOTE — Telephone Encounter (Signed)
error 

## 2019-03-30 DIAGNOSIS — E119 Type 2 diabetes mellitus without complications: Secondary | ICD-10-CM | POA: Diagnosis not present

## 2019-03-30 DIAGNOSIS — E7849 Other hyperlipidemia: Secondary | ICD-10-CM | POA: Diagnosis not present

## 2019-03-30 DIAGNOSIS — I10 Essential (primary) hypertension: Secondary | ICD-10-CM | POA: Diagnosis not present

## 2019-03-30 DIAGNOSIS — M810 Age-related osteoporosis without current pathological fracture: Secondary | ICD-10-CM | POA: Diagnosis not present

## 2019-04-06 DIAGNOSIS — K219 Gastro-esophageal reflux disease without esophagitis: Secondary | ICD-10-CM | POA: Diagnosis not present

## 2019-04-06 DIAGNOSIS — N390 Urinary tract infection, site not specified: Secondary | ICD-10-CM | POA: Diagnosis not present

## 2019-04-06 DIAGNOSIS — Z0000 Encounter for general adult medical examination without abnormal findings: Secondary | ICD-10-CM | POA: Diagnosis not present

## 2019-04-06 DIAGNOSIS — K760 Fatty (change of) liver, not elsewhere classified: Secondary | ICD-10-CM | POA: Diagnosis not present

## 2019-04-06 DIAGNOSIS — D126 Benign neoplasm of colon, unspecified: Secondary | ICD-10-CM | POA: Diagnosis not present

## 2019-04-06 DIAGNOSIS — J302 Other seasonal allergic rhinitis: Secondary | ICD-10-CM | POA: Diagnosis not present

## 2019-04-06 DIAGNOSIS — I480 Paroxysmal atrial fibrillation: Secondary | ICD-10-CM | POA: Diagnosis not present

## 2019-04-06 DIAGNOSIS — E785 Hyperlipidemia, unspecified: Secondary | ICD-10-CM | POA: Diagnosis not present

## 2019-04-06 DIAGNOSIS — E119 Type 2 diabetes mellitus without complications: Secondary | ICD-10-CM | POA: Diagnosis not present

## 2019-04-06 DIAGNOSIS — M810 Age-related osteoporosis without current pathological fracture: Secondary | ICD-10-CM | POA: Diagnosis not present

## 2019-04-06 DIAGNOSIS — Z791 Long term (current) use of non-steroidal anti-inflammatories (NSAID): Secondary | ICD-10-CM | POA: Diagnosis not present

## 2019-04-06 DIAGNOSIS — F419 Anxiety disorder, unspecified: Secondary | ICD-10-CM | POA: Diagnosis not present

## 2019-04-08 ENCOUNTER — Other Ambulatory Visit: Payer: Self-pay | Admitting: Internal Medicine

## 2019-04-08 DIAGNOSIS — E785 Hyperlipidemia, unspecified: Secondary | ICD-10-CM

## 2019-04-15 NOTE — Progress Notes (Signed)
Cardiology Office Note:    Date:  04/16/2019   ID:  Amanda Frost, DOB [birth year], MRN [id number]  PCP:  Crist Infante, MD  Cardiologist:  Sanda Klein, MD   Referring MD: Crist Infante, MD   Chief Complaint  Patient presents with  . Follow-up    atypical chest pressure    History of Present Illness:    Amanda Frost is a 69 y.o. 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 Her last DCCV was 11/22/17 in which she went to the ER for SOB found to be in rapid Afib. DCCV with conversion to NSR. She is anticoagulated with eliquis and is compliant. She was last seen in clinic on 05/26/18 and reported significant anxiety surrounding raising a grandchild. No medication changes.  Of note, she had a heart cath in 2009 with normal coronaries.   She presents today for follow up. She reports three episodes of weakness associated with chest pressure in the pat 1.5 weeks. Chest pressure lasted less than 1 minutes. No relieving or aggravating factors. Chest pressure is not exertional. No palpitations, rapid heart rate, or SOB. She denies orthopnea and lower extremity swelling. She reports that this chest pressure is new and not like her prior chest tightness associated with anxiety/stress. Chest pressure is in the center of her chest without radiation.    A1c was 6.8% now improved to under 6% through diet and exercise. Her PCP had ordered a calcium score.   Past Medical History:  Diagnosis Date  . Hypertension   . 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 Ocular migraine   . Palpitations    PAC's,PVC's,atrial tachycardia  . Paroxysmal atrial fibrillation [facility])     Past Surgical History:  Procedure Laterality Date  . BREAST BIOPSY  1994  . CARDIAC CATHETERIZATION  11/05/2007   normal LV fx,patent coronary arteries  . NM MYOVIEW LTD  03/12/2005   normal  . US ECHOCARDIOGRAPHY   11/26/2007   mild LVH,trace MR,TR    Current Medications: Current Meds  Medication Sig  . adapalene (DIFFERIN) 0.1 % cream Apply 1 application topically daily.  . Ascorbic Acid (VITAMIN C) 100 MG tablet Take 100 mg by mouth daily.  . cephALEXin (KEFLEX) 250 MG capsule Take 250 mg by mouth daily.  . Cholecalciferol (VITAMIN D3) 5000 units CAPS Take 2 capsules by mouth every Monday, Tuesday, Wednesday, Thursday, and Friday.   Marland Kitchen ELIQUIS 5 MG TABS tablet TAKE 1 TABLET BY MOUTH TWICE A DAY  . fexofenadine (ALLEGRA) 180 MG tablet Take 180 mg by mouth daily as needed for allergies or rhinitis.  . metoprolol succinate (TOPROL-XL) 50 MG 24 hr tablet TAKE 1 (50 MG) BY MOUTH EACH MORNING AND ONE (1/2) TABLET (25 MG) BY MOUTH EACH EVENING.     Allergies:   Macrodantin [nitrofurantoin macrocrystal], Ciprofloxacin, and Sulfa antibiotics   Social History   Socioeconomic History  . Marital status: Married    Spouse name: Not on file  . Number of children: 4  . Years of education: Not on file  . Highest education level: Some college, no degree  Occupational History  . Not on file  Tobacco Use  . Smoking status: Never Smoker  . Smokeless tobacco: Never Used  Substance and Sexual Activity  . Alcohol use: No    Alcohol/week: 0.0 standard drinks  . Drug use: No  . Sexual activity: Not on file  Other Topics  Concern  . Not on file  Social History Narrative   Pt lives in [place] with spouse and 45 yr old granddaughter.  Retired Control and instrumentation engineer (kindergarten)   Right handed   Caffeine: tea, 1 cup/day   Social Determinants of Health   Financial Resource Strain:   . Difficulty of Paying Living Expenses: Not on file  Food Insecurity:   . Worried About Charity fundraiser in the Last Year: Not on file  . Ran Out of Food in the Last Year: Not on file  Transportation Needs:   . Lack of Transportation (Medical): Not on file  . Lack of Transportation (Non-Medical): Not on file  Physical  Activity:   . Days of Exercise per Week: Not on file  . Minutes of Exercise per Session: Not on file  Stress:   . Feeling of Stress : Not on file  Social Connections:   . Frequency of Communication with Friends and Family: Not on file  . Frequency of Social Gatherings with Friends and Family: Not on file  . Attends Religious Services: Not on file  . Active Member of Clubs or Organizations: Not on file  . Attends Archivist Meetings: Not on file  . Marital Status: Not on file     Family History: The patient's family history includes Diabetes in her father and mother; Hypertension in her father and mother; Kidney disease in her mother; Migraines in her maternal grandmother, son, and son.  ROS:   Please see the history of present illness.     All other systems reviewed and are negative.  EKGs/Labs/Other Studies Reviewed:    The following studies were reviewed today:  Left heart cath 2009 - normal coronaries  EKG:  EKG is ordered today.  The ekg ordered today demonstrates sinus rhythm with HR 66  Recent Labs: 05/21/2018: ALT 17; BUN 9; Creatinine, Ser 0.80; Hemoglobin 12.8; Platelets 271; Potassium 4.3; Sodium 139  Recent Lipid Panel    Component Value Date/Time   CHOL  11/05/2007 0821    136        ATP III CLASSIFICATION:  <200     mg/dL   Desirable  200-239  mg/dL   Borderline High  >=240    mg/dL   High   TRIG 145 11/05/2007 0821   HDL 36 (L) 11/05/2007 0821   CHOLHDL 3.8 11/05/2007 0821   VLDL 29 11/05/2007 0821   LDLCALC  11/05/2007 0821    71        Total Cholesterol/HDL:CHD Risk Coronary Heart Disease Risk Table                     Men   Women  1/2 Average Risk   3.4   3.3    Physical Exam:    VS:  BP 139/89   Pulse 66   Temp (!) 93.4 F (34.1 C)   Ht 5' (1.524 m)   Wt 176 lb (79.8 kg)   SpO2 97%   BMI 34.37 kg/m     Wt Readings from Last 3 Encounters:  04/16/19 176 lb (79.8 kg)  11/18/18 175 lb (79.4 kg)  05/26/18 167 lb 6.4 oz (75.9 kg)      GEN: Well nourished, well developed in no acute distress HEENT: Normal NECK: No JVD; No carotid bruits LYMPHATICS: No lymphadenopathy CARDIAC: RRR, no murmurs, rubs, gallops RESPIRATORY:  Clear to auscultation without rales, wheezing or rhonchi  ABDOMEN: Soft, non-tender, non-distended MUSCULOSKELETAL:  No edema; No  deformity  SKIN: Warm and dry NEUROLOGIC:  Alert and oriented x 3 PSYCHIATRIC:  Normal affect   ASSESSMENT:    1. Precordial pain   2. Paroxysmal atrial fibrillation (HCC)   3. Paroxysmal atrial tachycardia ([place])   4. Chronic anticoagulation   5. Essential hypertension    PLAN:    In order of problems listed above:  Precordial chest pain - chest pressure sounds somewhat atypical and likely related to her anxiety; however, she reports that this is a change from her baseline and has occurred with a feeling of weakness - I will cancel the calcium score and instead obtain CT coronary - she is very much in agreement with this plan   PAF PAT Chronic anticoagulation - continue current medication regimen including eliquis - This patients CHA2DS2-VASc Score and unadjusted Ischemic Stroke Rate (% per year) is equal to 3.2 % stroke rate/year from a score of 3 (female, age, HTN)   Hypertension - only on toprol - BP well controlled, no changes   Anxiety I think this may be driving many of her symptoms. Obtaining imaging may alleviate some of her anxiety surrounding her new chest pressure.    Follow up with Dr. Sallyanne Kuster in 1  Year.   Medication Adjustments/Labs and Tests Ordered: Current medicines are reviewed at length with the patient today.  Concerns regarding medicines are outlined above.  Orders Placed This Encounter  Procedures  . CT CORONARY MORPH W/CTA COR W/SCORE W/CA W/CM &/OR WO/CM  . CT CORONARY FRACTIONAL FLOW RESERVE DATA PREP  . CT CORONARY FRACTIONAL FLOW RESERVE FLUID ANALYSIS  . Basic metabolic panel  . EKG 12-Lead   No orders of the  defined types were placed in this encounter.   Signed, Ledora Bottcher, PA  04/16/2019 1:00 PM    [facility]

## 2019-04-16 ENCOUNTER — Ambulatory Visit: Payer: PPO | Admitting: Physician Assistant

## 2019-04-16 ENCOUNTER — Other Ambulatory Visit: Payer: Self-pay

## 2019-04-16 ENCOUNTER — Encounter: Payer: Self-pay | Admitting: Physician Assistant

## 2019-04-16 VITALS — BP 139/89 | HR 66 | Temp 93.4°F | Ht 68.0 in | Wt 176.0 lb

## 2019-04-16 DIAGNOSIS — Z7901 Long term (current) use of anticoagulants: Secondary | ICD-10-CM

## 2019-04-16 DIAGNOSIS — R072 Precordial pain: Secondary | ICD-10-CM

## 2019-04-16 DIAGNOSIS — I480 Paroxysmal atrial fibrillation: Secondary | ICD-10-CM

## 2019-04-16 DIAGNOSIS — I471 Supraventricular tachycardia: Secondary | ICD-10-CM | POA: Diagnosis not present

## 2019-04-16 DIAGNOSIS — I10 Essential (primary) hypertension: Secondary | ICD-10-CM

## 2019-04-16 NOTE — Progress Notes (Signed)
Thanks. Sounds appropriate.

## 2019-04-16 NOTE — Patient Instructions (Signed)
Your cardiac CT will be scheduled at one of the below locations:   [facility] [address] [place], [place] [address] [phone]  If scheduled at [facility], please arrive at the [facility] main entrance of [facility] 30-45 minutes prior to test start time. Proceed to the [facility] Radiology Department (first floor) to check-in and test prep.   Please follow these instructions carefully (unless otherwise directed):  On the Night Before the Test: . Be sure to Drink plenty of water. . Do not consume any caffeinated/decaffeinated beverages or chocolate 12 hours prior to your test. . Do not take any antihistamines 12 hours prior to your test.  On the Day of the Test: . Drink plenty of water. Do not drink any water within one hour of the test. . Do not eat any food 4 hours prior to the test. . You may take your regular medications prior to the test.  . Take metoprolol 50 mg-- two hours prior to test. . HOLD Furosemide/Hydrochlorothiazide morning of the test. . FEMALES- please wear underwire-free bra if available      After the Test: . Drink plenty of water. . After receiving IV contrast, you may experience a mild flushed feeling. This is normal. . On occasion, you may experience a mild rash up to 24 hours after the test. This is not dangerous. If this occurs, you can take Benadryl 25 mg and increase your fluid intake. . If you experience trouble breathing, this can be serious. If it is severe call 911 IMMEDIATELY. If it is mild, please call our office. . If you take any of these medications: Glipizide/Metformin, Avandament, Glucavance, please do not take 48 hours after completing test unless otherwise instructed.   Once we have confirmed authorization from your insurance company, we will call you to set up a date and time for your test.   For non-scheduling related questions, please contact the cardiac imaging nurse navigator should you have any  questions/concerns:  Marchia Bond, RN Navigator Cardiac Imaging [facility] [phone] Office

## 2019-04-17 ENCOUNTER — Telehealth: Payer: Self-pay | Admitting: Cardiovascular Disease

## 2019-04-17 NOTE — Telephone Encounter (Signed)
Patient had an appt with Dayna Dunn yesterday. She states that some of the information on the after visit summary was wrong. She states that it said she was having precordial pain, which she denies having. She also states that her height was wrong. It has her height as 5 foot but she is 5'8. She also believes her temperature was wrong, it has it marked at 93.4. She would like a call back from a nurse to get more clarification.

## 2019-04-17 NOTE — Telephone Encounter (Signed)
Spoke to patient I made correction in chart 5'8".

## 2019-05-05 DIAGNOSIS — H40013 Open angle with borderline findings, low risk, bilateral: Secondary | ICD-10-CM | POA: Diagnosis not present

## 2019-05-05 DIAGNOSIS — H25013 Cortical age-related cataract, bilateral: Secondary | ICD-10-CM | POA: Diagnosis not present

## 2019-05-05 DIAGNOSIS — H04123 Dry eye syndrome of bilateral lacrimal glands: Secondary | ICD-10-CM | POA: Diagnosis not present

## 2019-05-05 DIAGNOSIS — H2513 Age-related nuclear cataract, bilateral: Secondary | ICD-10-CM | POA: Diagnosis not present

## 2019-05-13 DIAGNOSIS — M79602 Pain in left arm: Secondary | ICD-10-CM | POA: Diagnosis not present

## 2019-05-13 DIAGNOSIS — M25512 Pain in left shoulder: Secondary | ICD-10-CM | POA: Diagnosis not present

## 2019-05-16 DIAGNOSIS — Z20822 Contact with and (suspected) exposure to covid-19: Secondary | ICD-10-CM | POA: Diagnosis not present

## 2019-05-16 DIAGNOSIS — R5383 Other fatigue: Secondary | ICD-10-CM | POA: Diagnosis not present

## 2019-05-17 ENCOUNTER — Other Ambulatory Visit: Payer: Self-pay | Admitting: Adult Health

## 2019-05-27 NOTE — Progress Notes (Signed)
PT called regarding instruction for CT Heart scheduled on Friday. She states that someone told her RN Clarise Cruz would call her.I advised that CT Heart Nav Clarise Cruz does call with instructions usually the day prior to procedure and this is probably why she has not received a phone call as of yet. I provided Clarise Cruz office number to pt if she wishes to call. Pt verbalized understanding and appreciative of this information.

## 2019-05-28 ENCOUNTER — Telehealth (HOSPITAL_COMMUNITY): Payer: Self-pay | Admitting: Emergency Medicine

## 2019-05-28 ENCOUNTER — Other Ambulatory Visit: Payer: Self-pay | Admitting: Adult Health

## 2019-05-28 NOTE — Telephone Encounter (Signed)
Reaching out to patient to offer assistance regarding upcoming cardiac imaging study; pt verbalizes understanding of appt date/time, parking situation and where to check in, pre-test NPO status and medications ordered, and verified current allergies; name and call back number provided for further questions should they arise Radiah Lubinski RN Navigator Cardiac Imaging [facility] [phone] office [phone] cell 

## 2019-05-29 ENCOUNTER — Other Ambulatory Visit: Payer: Self-pay

## 2019-05-29 ENCOUNTER — Encounter (HOSPITAL_COMMUNITY): Payer: Self-pay

## 2019-05-29 ENCOUNTER — Ambulatory Visit (HOSPITAL_COMMUNITY)
Admission: RE | Admit: 2019-05-29 | Discharge: 2019-05-29 | Disposition: A | Discharge status: Home / Self Care | Payer: PPO | Source: Ambulatory Visit | Attending: Physician Assistant | Admitting: Physician Assistant

## 2019-05-29 DIAGNOSIS — R072 Precordial pain: Secondary | ICD-10-CM | POA: Insufficient documentation

## 2019-05-29 LAB — POCT I-STAT CREATININE: Creatinine, Ser: 0.8 mg/dL (ref 0.44–1.00)

## 2019-05-29 MED ORDER — NITROGLYCERIN 0.4 MG SL SUBL
SUBLINGUAL_TABLET | SUBLINGUAL | Status: AC
  Administered 2019-05-29: 0.8 mg via SUBLINGUAL
  Filled 2019-05-29: qty 2

## 2019-05-29 MED ORDER — NITROGLYCERIN 0.4 MG SL SUBL: 0.8000 mg | SUBLINGUAL_TABLET | Freq: Once | SUBLINGUAL | Status: AC

## 2019-05-29 MED ORDER — IOHEXOL 350 MG/ML SOLN
80.0000 mL | Freq: Once | INTRAVENOUS | Status: AC | PRN
  Administered 2019-05-29: 80 mL via INTRAVENOUS

## 2019-06-03 ENCOUNTER — Telehealth: Payer: Self-pay | Admitting: Physician Assistant

## 2019-06-03 DIAGNOSIS — M810 Age-related osteoporosis without current pathological fracture: Secondary | ICD-10-CM | POA: Diagnosis not present

## 2019-06-03 NOTE — Telephone Encounter (Signed)
Called Amanda Frost to discuss aortic root dilation. She is concerned about her murmur and progression of disease. She had a lot of questions concerning symptoms and next steps. We discussed the size and when she would be referred to CT surgery. We discussed risk factors and importance of controlling her BP. She is not a smoker.   I will forward to Dr. Sallyanne Kuster to see if he recommends repeat echo, last [place] in 2014 mentions normal aortic root size.

## 2019-06-03 NOTE — Telephone Encounter (Signed)
That degree of aortic enlargement is really borderline. It does not put her in any danger, anytime in the near future. I'd simply repeat a CT angiogram in one year. On noncontrast studies (like the one that she had just done)  measurement of the aortic diameter is less precise and sometimes exaggerated,since it's hard to see the limits of the vessel wall.  Calcium score of zero is excellent news.

## 2019-06-03 NOTE — Telephone Encounter (Signed)
   Pt calling regarding her CT results  Please call

## 2019-06-03 NOTE — Telephone Encounter (Signed)
Left message for patient to call back  

## 2019-06-03 NOTE — Telephone Encounter (Signed)
Spoke with patient. Results of CT scan reviewed. Patient has multiple questions about the aorta dilation. Will route to Fabian Sharp to call patient and explain in further detail.

## 2019-06-05 ENCOUNTER — Telehealth: Payer: Self-pay | Admitting: Cardiovascular Disease

## 2019-06-05 NOTE — Telephone Encounter (Signed)
Patient's husband calling to speak with Dr. Sallyanne Kuster, states that his wife had a CT scan and they still have some concerns they would like to speak with him about. They do not want to speak with a nurse or a PA, husband made it clear that he wanted to speak to Dr. Sallyanne Kuster only when he has the time.

## 2019-06-05 NOTE — Telephone Encounter (Signed)
Will route to MD as they requested not to speak to anyone but Dr.C.

## 2019-06-10 ENCOUNTER — Other Ambulatory Visit: Payer: PPO

## 2019-06-12 NOTE — Telephone Encounter (Signed)
Call placed to the patient and her husband who were both on the phone. She stated that she was nervous and concerned about her cardiac ct results. She was informed that she had a dilated aortic root and would like more information on it.  She has been educated on this and advised that they are typically watched on a yearly basis. She had been told that it was 5 cm and had read on the Internet the dangers of this. She has been advised that according to her results it is 4.0 cm.  She has been advised that it is important to keep her blood pressure under control. She did state that her PCP added Losartan 25 mg nighttime. She stated that it had been running in the 140/90's due to stress. This has been added to her medication list.   She has been advised to please call back if she has any further questions or concerns and that I will route the message to Dr. Sallyanne Kuster to see if he has any further recommendations or advice.

## 2019-07-14 ENCOUNTER — Other Ambulatory Visit: Payer: Self-pay | Admitting: *Deleted

## 2019-07-14 DIAGNOSIS — I77810 Thoracic aortic ectasia: Secondary | ICD-10-CM

## 2019-07-22 ENCOUNTER — Other Ambulatory Visit: Payer: Self-pay | Admitting: Obstetrics and Gynecology

## 2019-07-22 DIAGNOSIS — R921 Mammographic calcification found on diagnostic imaging of breast: Secondary | ICD-10-CM

## 2019-08-07 DIAGNOSIS — N3941 Urge incontinence: Secondary | ICD-10-CM | POA: Diagnosis not present

## 2019-08-07 DIAGNOSIS — N390 Urinary tract infection, site not specified: Secondary | ICD-10-CM | POA: Diagnosis not present

## 2019-08-25 ENCOUNTER — Encounter (HOSPITAL_COMMUNITY): Payer: Self-pay | Admitting: Emergency Medicine

## 2019-08-25 ENCOUNTER — Other Ambulatory Visit: Payer: Self-pay

## 2019-08-25 ENCOUNTER — Emergency Department (HOSPITAL_COMMUNITY): Payer: PPO

## 2019-08-25 ENCOUNTER — Emergency Department (HOSPITAL_COMMUNITY)
Admission: EM | Admit: 2019-08-25 | Discharge: 2019-08-25 | Disposition: A | Discharge status: Home / Self Care | Payer: PPO | Attending: Emergency Medicine | Admitting: Emergency Medicine

## 2019-08-25 DIAGNOSIS — R002 Palpitations: Secondary | ICD-10-CM | POA: Diagnosis present

## 2019-08-25 DIAGNOSIS — Z7901 Long term (current) use of anticoagulants: Secondary | ICD-10-CM | POA: Insufficient documentation

## 2019-08-25 DIAGNOSIS — F419 Anxiety disorder, unspecified: Secondary | ICD-10-CM | POA: Diagnosis not present

## 2019-08-25 DIAGNOSIS — I10 Essential (primary) hypertension: Secondary | ICD-10-CM | POA: Diagnosis not present

## 2019-08-25 DIAGNOSIS — I4891 Unspecified atrial fibrillation: Secondary | ICD-10-CM | POA: Insufficient documentation

## 2019-08-25 DIAGNOSIS — Z79899 Other long term (current) drug therapy: Secondary | ICD-10-CM | POA: Diagnosis not present

## 2019-08-25 DIAGNOSIS — R079 Chest pain, unspecified: Secondary | ICD-10-CM | POA: Diagnosis not present

## 2019-08-25 LAB — BASIC METABOLIC PANEL
Anion gap: 12 (ref 5–15)
BUN: 17 mg/dL (ref 8–23)
CO2: 24 mmol/L (ref 22–32)
Calcium: 10 mg/dL (ref 8.9–10.3)
Chloride: 104 mmol/L (ref 98–111)
Creatinine, Ser: 0.84 mg/dL (ref 0.44–1.00)
GFR calc Af Amer: >60 mL/min (ref >60)
GFR calc non Af Amer: >60 mL/min (ref >60)
Glucose, Bld: 107 mg/dL — ABNORMAL HIGH (ref 70–99)
Potassium: 3.5 mmol/L (ref 3.5–5.1)
Sodium: 140 mmol/L (ref 135–145)

## 2019-08-25 LAB — CBC
HCT: 41 % (ref 36.0–46.0)
Hemoglobin: 13 g/dL (ref 12.0–15.0)
MCH: 30.3 pg (ref 26.0–34.0)
MCHC: 31.7 g/dL (ref 30.0–36.0)
MCV: 95.6 fL (ref 80.0–100.0)
Platelets: 220 10*3/uL (ref 150–400)
RBC: 4.29 MIL/uL (ref 3.87–5.11)
RDW: 13.6 % (ref 11.5–15.5)
WBC: 8.3 10*3/uL (ref 4.0–10.5)
nRBC: 0 % (ref 0.0–0.2)

## 2019-08-25 LAB — PROTIME-INR
INR: 1.1 (ref 0.8–1.2)
Prothrombin Time: 13.8 seconds (ref 11.4–15.2)

## 2019-08-25 MED ORDER — SODIUM CHLORIDE 0.9% FLUSH
3.0000 mL | Freq: Once | INTRAVENOUS | Status: AC
  Administered 2019-08-25: 3 mL via INTRAVENOUS

## 2019-08-25 MED ORDER — ETOMIDATE 2 MG/ML IV SOLN
INTRAVENOUS | Status: AC | PRN
  Administered 2019-08-25: 6 mg via INTRAVENOUS

## 2019-08-25 MED ORDER — ETOMIDATE 2 MG/ML IV SOLN
INTRAVENOUS | Status: AC
  Administered 2019-08-25: 6 mg via INTRAVENOUS
  Filled 2019-08-25: qty 10

## 2019-08-25 MED ORDER — ETOMIDATE 2 MG/ML IV SOLN: 10.0000 mg | Freq: Once | INTRAVENOUS | Status: AC

## 2019-08-25 MED ORDER — SODIUM CHLORIDE 0.9 % IV SOLN
INTRAVENOUS | Status: AC | PRN
  Administered 2019-08-25: 50 mL/h via INTRAVENOUS

## 2019-08-25 NOTE — Sedation Documentation (Signed)
Pt cardioverted synchronously with 150 J.

## 2019-08-25 NOTE — ED Triage Notes (Signed)
Pt stated she is having an eposide of AFIB.  EKG shows AFIB w/RVR.  No chest pain, n/v/d, dizziness.  Pt states she just finished a round of Cipro and had a very stressful today today.

## 2019-08-25 NOTE — ED Notes (Signed)
Pt anxious. Asking "is this something I could die from". This nurse used breathing techniques to reduce anxiety.

## 2019-08-25 NOTE — Progress Notes (Signed)
Cardiology Office Note   Date:  08/26/2019   ID:  Amanda Frost, DOB Feb 15, 1951, MRN [id number]  PCP:  Crist Infante, MD  Cardiologist:  Sanda Klein, MD EP: None  Chief Complaint  Patient presents with  . Follow-up    Atrial fibrillation      History of Present Illness: Amanda Frost is a 69 y.o. female with a PMH of paroxysmal atrial fibrillation, aortic root aneurysm, HTN, and NASH, who presents for post-ED visit follow-up.  She was last evaluated by cardiology at an outpatient visit with Doreene Adas, PA-C 04/2019 for the evaluation of chest pain. She underwent a coronary CTA 05/2019 which showed normal coronaries with a calcium score of 0, though did reveal a 4cm aortic root aneurysm. Her last echo was in 2015 which showed EF 55-60% with no RWMA, and no valvular abnormalities. She was subsequently seen in the ED 08/25/19 with recurrent atrial fibrillation with RVR which woke her from sleep. Given compliance with her eliquis she underwent DCCV in the ED with successful return to NSR. She was recommended for close cardiology follow-up outpatient.  She presents today for follow-up of her recent ED visit for atrial fibrillation. This is her 4th significant episode since her diagnosis in 2016. She reports being under increase stress recently as they have been struggling with family issues in regards to their son who has addiction/violence issues, whom they have a restraining order against. She and her husband have full custody of her granddaughter at this time. She recently completed a course of antibiotics for a UTI (cipro) and wonders if that potentially contributed to her episode of Afib yesterday, as in hindsight she may have had a previous episode following cipro use as well. She reports episodes of brief palpitations 3-4 times per week which last for seconds to minutes at a time and are frequently relieved with coughing. She also has episodes of palpitations 2-3 times per  month which lasts an hour at a time which she states is not a fast beating rate. She wonders if this may be atrial fibrillation that is not as severe as the episodes that bring her to the ED. No complaints of chest pain, dizziness, lightheadedness, syncope, or bleeding on eliquis. She has some chronic DOE which is unchanged in recent months to years.      Past Medical History:  Diagnosis Date  . Hypertension   . Liver cirrhosis secondary to NASH (nonalcoholic steatohepatitis) ([place])   . Ocular migraine   . Palpitations    PAC's,PVC's,atrial tachycardia  . Paroxysmal atrial fibrillation [facility])     Past Surgical History:  Procedure Laterality Date  . BREAST BIOPSY  1994  . CARDIAC CATHETERIZATION  11/05/2007   normal LV fx,patent coronary arteries  . NM MYOVIEW LTD  03/12/2005   normal  . US ECHOCARDIOGRAPHY  11/26/2007   mild LVH,trace MR,TR     Current Outpatient Medications  Medication Sig Dispense Refill  . acetaminophen (TYLENOL) 500 MG tablet Take 500 mg by mouth daily as needed.    Marland Kitchen adapalene (DIFFERIN) 0.1 % cream Apply 1 application topically daily.    . Ascorbic Acid (VITAMIN C) 100 MG tablet Take 100 mg by mouth daily.    . cephALEXin (KEFLEX) 250 MG capsule Take 250 mg by mouth daily.    . Cholecalciferol (VITAMIN D3) 5000 units CAPS Take 2 capsules by mouth every Monday, Tuesday, Wednesday, Thursday, and Friday.     Marland Kitchen ELIQUIS 5 MG TABS tablet TAKE  1 TABLET BY MOUTH TWICE A DAY (Patient taking differently: Take 5 mg by mouth 2 (two) times daily. ) 60 tablet 5  . fexofenadine (ALLEGRA) 180 MG tablet Take 180 mg by mouth daily as needed for allergies or rhinitis.    Marland Kitchen ibandronate (BONIVA) 150 MG tablet Take 150 mg by mouth every 30 (thirty) days.    Marland Kitchen losartan (COZAAR) 50 MG tablet Take 1 tablet (50 mg total) by mouth daily. 90 tablet 3  . metoprolol succinate (TOPROL-XL) 50 MG 24 hr tablet TAKE 1 (50 MG) BY MOUTH EACH MORNING AND ONE (1/2) TABLET (25 MG) BY MOUTH EACH  EVENING. (Patient taking differently: Take 25-50 mg by mouth See admin instructions. Take 1 (50 mg) by mouth each morning and one (1/2) tablet (25 mg) by mouth each evening.) 135 tablet 3   No current facility-administered medications for this visit.    Allergies:   Macrodantin [nitrofurantoin macrocrystal], Ciprofloxacin, and Sulfa antibiotics    Social History:  The patient  reports that she has never smoked. She has never used smokeless tobacco. She reports that she does not drink alcohol or use drugs.   Family History:  The patient's family history includes Diabetes in her father and mother; Hypertension in her father and mother; Kidney disease in her mother; Migraines in her maternal grandmother, son, and son.    ROS:  Please see the history of present illness.   Otherwise, review of systems are positive for none.   All other systems are reviewed and negative.    PHYSICAL EXAM: VS:  BP (!) 162/78   Pulse 65   Ht 5\' 8"  (1.727 m)   Wt 167 lb (75.8 kg)   SpO2 98%   BMI 25.39 kg/m  , BMI Body mass index is 25.39 kg/m. GEN: Well nourished, well developed, in no acute distress HEENT: sclera anicteric Neck: no JVD, carotid bruits, or masses Cardiac: RRR; no murmurs, rubs, or gallops,no edema  Respiratory:  clear to auscultation bilaterally, normal work of breathing GI: soft, nontender, nondistended, + BS MS: no deformity or atrophy Skin: warm and dry, no rash Neuro:  Strength and sensation are intact Psych: euthymic mood, full affect   EKG:  EKG is ordered today. The ekg ordered today demonstrates sinus rhythm, rate 61 bpm, no STE/D, no TWI   Recent Labs: 08/25/2019: BUN 17; Creatinine, Ser 0.84; Hemoglobin 13.0; Platelets 220; Potassium 3.5; Sodium 140    Lipid Panel    Component Value Date/Time   CHOL  11/05/2007 0821    136        ATP III CLASSIFICATION:  <200     mg/dL   Desirable  200-239  mg/dL   Borderline High  >=240    mg/dL   High   TRIG 145 11/05/2007  0821   HDL 36 (L) 11/05/2007 0821   CHOLHDL 3.8 11/05/2007 0821   VLDL 29 11/05/2007 0821   LDLCALC  11/05/2007 0821    71        Total Cholesterol/HDL:CHD Risk Coronary Heart Disease Risk Table                     Men   Women  1/2 Average Risk   3.4   3.3      Wt Readings from Last 3 Encounters:  08/26/19 167 lb (75.8 kg)  08/25/19 165 lb (74.8 kg)  04/16/19 176 lb (79.8 kg)      Other studies Reviewed: Additional studies/ records that were  reviewed today include:   Coronary CTA 05/2019: IMPRESSION: 1. Calcium score 0  2.  CAD RADS 0 normal left dominant coronary arteries  3. Dilated aortic root 4.0 cm in orthogonal measurements Suggest f/u CTA in a year Aortic valve appears tri leaflet    ASSESSMENT AND PLAN:  1. Paroxysmal atrial fibrillation: recurrent episode 08/25/19 s/p DCCV in the ED with return to NSR.  - Continue metoprolol succinate for rate control - Continue eliquis for stroke ppx - Will repeat a 30 day monitor to evaluate burden of atrial fibrillation and other underlying arrhythmias - Will refer to EP for ablation vs antiarrhythmic consideration.   2. HTN: BP 162/78 today - Will increase losartan to 50mg  daily - Continue metoprolol succinate - unable to uptitrate due to HR in the low 60s.   3. 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  The patient does not have concerns regarding medicines.  The following changes have been made:  As above  Labs/ tests ordered today include:   Orders Placed This Encounter  Procedures  . Ambulatory referral to Cardiac Electrophysiology  . CARDIAC EVENT MONITOR  . EKG 12-Lead     Disposition:   FU with Dr. Sallyanne Kuster in 3 months  Signed, Abigail Butts, PA-C  08/26/2019 10:14 AM

## 2019-08-25 NOTE — ED Provider Notes (Signed)
Medical screening examination/treatment/procedure(s) were conducted as a shared visit with non-physician practitioner(s) and myself.  I personally evaluated the patient during the encounter.     Patient presents in atrial fibrillation with RVR.  States she went into atrial fibrillation at 1:45 AM.  She is not in persistent A. fib.  She is very sensitive to when she comes in and out of atrial fibrillation.  She has previously been cardioverted.  Reports that she is compliant with her Eliquis.  Questions whether she may have missed 1 dose a few days ago but is unsure.  Otherwise she states that she takes her medicines twice a day without any interruption.  She is not having chest pain or shortness of breath but palpitations.  We discussed risk and benefits of options including bedside cardioversion versus medical management.  Given that she knows exactly when she went into atrial fibrillation and is compliant with Eliquis even with one missed dose she was still be very low risk for post conversion stroke.  I discussed with her that this was not no risk but still very low risk.  We discussed risks and benefits.  She consented to cardioversion in the ED.  She was cardioverted without incident into normal sinus rhythm.  Physical Exam  BP (!) 151/129   Pulse (!) 116   Temp 98.3 F (36.8 C) (Oral)   Resp (!) 23   Ht 1.727 m (5\' 8" )   Wt 74.8 kg   SpO2 95%   BMI 25.09 kg/m   Physical Exam Awake, alert, no distress Tachycardia, irregular rhythm Breath sounds clear ED Course/Procedures     .Sedation  Date/Time: 08/25/2019 3:53 AM Performed by: Merryl Hacker, MD Authorized by: Merryl Hacker, MD   Consent:    Consent obtained:  Verbal   Consent given by:  Patient   Risks discussed:  Allergic reaction, dysrhythmia, inadequate sedation, nausea, prolonged hypoxia resulting in organ damage, prolonged sedation necessitating reversal, respiratory compromise necessitating ventilatory  assistance and intubation and vomiting   Alternatives discussed:  Analgesia without sedation, anxiolysis and regional anesthesia Universal protocol:    Procedure explained and questions answered to patient or proxy's satisfaction: yes     Relevant documents present and verified: yes     Test results available and properly labeled: yes     Imaging studies available: yes     Required blood products, implants, devices, and special equipment available: yes     Site/side marked: yes     Immediately prior to procedure a time out was called: yes     Patient identity confirmation method:  Verbally with patient Indications:    Procedure performed:  Cardioversion   Procedure necessitating sedation performed by:  Different physician Pre-sedation assessment:    Time since last food or drink:  2100   ASA classification: class 2 - patient with mild systemic disease     Neck mobility: normal     Mouth opening:  3 or more finger widths   Thyromental distance:  4 finger widths   Mallampati score:  I - soft palate, uvula, fauces, pillars visible   Pre-sedation assessments completed and reviewed: airway patency, cardiovascular function, hydration status, mental status, nausea/vomiting, pain level, respiratory function and temperature     Pre-sedation assessment completed:  08/25/2019 3:00 AM Immediate pre-procedure details:    Reassessment: Patient reassessed immediately prior to procedure     Reviewed: vital signs, relevant labs/tests and NPO status     Verified: bag valve mask available, emergency  equipment available, intubation equipment available, IV patency confirmed, oxygen available and suction available   Procedure details (see MAR for exact dosages):    Preoxygenation:  Nasal cannula   Sedation:  Etomidate   Intended level of sedation: deep   Intra-procedure monitoring:  Blood pressure monitoring, cardiac monitor, continuous pulse oximetry, frequent LOC assessments, frequent vital sign checks and  continuous capnometry   Intra-procedure events: none     Total Provider sedation time (minutes):  25 Post-procedure details:    Post-sedation assessment completed:  08/25/2019 3:54 AM   Attendance: Constant attendance by certified staff until patient recovered     Recovery: Patient returned to pre-procedure baseline     Post-sedation assessments completed and reviewed: airway patency, cardiovascular function, hydration status, mental status, nausea/vomiting, pain level, respiratory function and temperature     Patient is stable for discharge or admission: yes     Patient tolerance:  Tolerated well, no immediate complications    MDM  Patient presents in atrial fibrillation with RVR.  Cardioverted into normal sinus rhythm.  Observe post procedure without any complications.       Merryl Hacker, MD 08/25/19 [phone]

## 2019-08-25 NOTE — ED Notes (Signed)
Pt now in NSR. Resting comfortably.

## 2019-08-25 NOTE — ED Provider Notes (Signed)
[facility] EMERGENCY DEPARTMENT Provider Note   CSN: [id number] Arrival date & time: 08/25/19  [id number]     History Chief Complaint  Patient presents with  . Atrial Fibrillation    Amanda Frost is a 69 y.o. female.  Patient with past medical history notable for paroxysmal A. fib, hypertension, presents to the emergency department with a chief complaint of palpitations.  She states that she was awakened from sleep at 1:45 AM (about 1 hour ago) with palpitations.  She states that this feels like her A. fib.  She is not in persistent A. fib.  She takes Eliquis, and reports having been compliant except for missing 1 dose a couple of days ago because she thought she had already taken her medication.  She denies any chest pain or shortness of breath.  She states that the only symptom she is having is palpitations.  She states that she did have some emotional stressors earlier today with her son.  She also reports that she had just finished being treated for UTI with Cipro.  She denies any other associated symptoms.  The history is provided by the patient. No language interpreter was used.       Past Medical History:  Diagnosis Date  . Hypertension   . Liver cirrhosis secondary to NASH (nonalcoholic steatohepatitis) ([place])   . Ocular migraine   . Palpitations    PAC's,PVC's,atrial tachycardia  . Paroxysmal atrial fibrillation [facility])     Patient Active Problem List   Diagnosis Date Noted  . Cervico-occipital neuralgia of right side 11/18/2018  . Palpitations 10/15/2017  . Chronic anticoagulation 12/05/2016  . Paroxysmal atrial fibrillation ([place]) 01/21/2014  . Liver cirrhosis secondary to NASH ([place]) 01/21/2014  . Essential hypertension 10/29/2012  . Paroxysmal atrial tachycardia ([place]) 10/29/2012    Past Surgical History:  Procedure Laterality Date  . BREAST BIOPSY  1994  . CARDIAC CATHETERIZATION  11/05/2007   normal LV fx,patent coronary arteries  . NM  MYOVIEW LTD  03/12/2005   normal  . US ECHOCARDIOGRAPHY  11/26/2007   mild LVH,trace MR,TR     OB History   No obstetric history on file.     Family History  Problem Relation Age of Onset  . Diabetes Mother   . Hypertension Mother   . Kidney disease Mother   . Diabetes Father   . Hypertension Father   . Migraines Maternal Grandmother   . Migraines Son        childhood  . Migraines Son        childhood    Social History   Tobacco Use  . Smoking status: Never Smoker  . Smokeless tobacco: Never Used  Substance Use Topics  . Alcohol use: No    Alcohol/week: 0.0 standard drinks  . Drug use: No    Home Medications Prior to Admission medications   Medication Sig Start Date End Date Taking? Authorizing Provider  adapalene (DIFFERIN) 0.1 % cream Apply 1 application topically daily.    [provider]  Ascorbic Acid (VITAMIN C) 100 MG tablet Take 100 mg by mouth daily.    [provider]  cephALEXin (KEFLEX) 250 MG capsule Take 250 mg by mouth daily.    [provider]  Cholecalciferol (VITAMIN D3) 5000 units CAPS Take 2 capsules by mouth every Monday, Tuesday, Wednesday, Thursday, and Friday.     [provider]  ELIQUIS 5 MG TABS tablet TAKE 1 TABLET BY MOUTH TWICE A DAY 05/28/19  Lendon Colonel, NP  fexofenadine (ALLEGRA) 180 MG tablet Take 180 mg by mouth daily as needed for allergies or rhinitis.    [provider]  losartan (COZAAR) 25 MG tablet Take 25 mg by mouth daily.    [provider]  metoprolol succinate (TOPROL-XL) 50 MG 24 hr tablet TAKE 1 (50 MG) BY MOUTH EACH MORNING AND ONE (1/2) TABLET (25 MG) BY MOUTH EACH EVENING. 05/18/19   Lendon Colonel, NP  nitroGLYCERIN (NITRO-DUR) 0.2 mg/hr patch Place 1 patch (0.2 mg total) onto the skin daily. Patient not taking: Reported on 11/18/2018 11/07/18   Wallene Huh, DPM    Allergies    Macrodantin [nitrofurantoin macrocrystal], Ciprofloxacin, and Sulfa  antibiotics  Review of Systems   Review of Systems  All other systems reviewed and are negative.   Physical Exam Updated Vital Signs BP (!) 173/103 (BP Location: Left Arm)   Pulse (!) 147   Temp 98.3 F (36.8 C) (Oral)   Resp 20   Ht 5\' 8"  (1.727 m)   Wt 74.8 kg   SpO2 99%   BMI 25.09 kg/m   Physical Exam Vitals and nursing note reviewed.  Constitutional:      General: She is not in acute distress.    Appearance: She is well-developed.  HENT:     Head: Normocephalic and atraumatic.  Eyes:     Conjunctiva/sclera: Conjunctivae normal.  Cardiovascular:     Rate and Rhythm: Tachycardia present. Rhythm irregular.     Heart sounds: No murmur.  Pulmonary:     Effort: Pulmonary effort is normal. No respiratory distress.     Breath sounds: Normal breath sounds.  Abdominal:     Palpations: Abdomen is soft.     Tenderness: There is no abdominal tenderness.  Musculoskeletal:        General: Normal range of motion.     Cervical back: Neck supple.  Skin:    General: Skin is warm and dry.  Neurological:     Mental Status: She is alert and oriented to person, place, and time.  Psychiatric:        Mood and Affect: Mood normal.        Behavior: Behavior normal.     ED Results / Procedures / Treatments   Labs (all labs ordered are listed, but only abnormal results are displayed) Labs Reviewed  CBC  BASIC METABOLIC PANEL  PROTIME-INR    EKG None  Radiology DG Chest Portable 1 View  Result Date: 08/25/2019 CLINICAL DATA:  Atrial fibrillation, no chest pain EXAM: PORTABLE CHEST 1 VIEW COMPARISON:  CT 05/29/2019, radiograph 08/29/2014 FINDINGS: No consolidation, features of edema, pneumothorax, or effusion. Pulmonary vascularity is normally distributed. The cardiomediastinal contours are unremarkable. No acute osseous or soft tissue abnormality. IMPRESSION: No acute cardiopulmonary abnormality. Electronically Signed   By: Lovena Le M.D.   On: 08/25/2019 02:41     Procedures .Cardioversion  Date/Time: 08/25/2019 4:00 AM Performed by: Montine Circle, PA-C Authorized by: Merryl Hacker, MD   Consent:    Consent obtained:  Written and emergent situation   Consent given by:  Patient   Risks discussed:  Cutaneous burn, death, induced arrhythmia and pain   Alternatives discussed:  Rate-control medication Pre-procedure details:    Cardioversion basis:  Emergent   Rhythm:  Atrial fibrillation   Electrode placement:  Anterior-posterior Patient sedated: Yes. Refer to sedation procedure documentation for details of sedation.  Attempt one:    Cardioversion mode:  Synchronous  Waveform:  Biphasic   Shock (Joules):  150   Shock outcome:  Conversion to normal sinus rhythm Post-procedure details:    Patient status:  Awake   Patient tolerance of procedure:  Tolerated well, no immediate complications .Critical Care Performed by: Montine Circle, PA-C Authorized by: Montine Circle, PA-C   Critical care provider statement:    Critical care time (minutes):  40   Critical care was necessary to treat or prevent imminent or life-threatening deterioration of the following conditions:  Circulatory failure   Critical care was time spent personally by me on the following activities:  Discussions with consultants, evaluation of patient's response to treatment, examination of patient, ordering and performing treatments and interventions, ordering and review of laboratory studies, ordering and review of radiographic studies, pulse oximetry, re-evaluation of patient's condition, obtaining history from patient or surrogate and review of old charts   (including critical care time)  Medications Ordered in ED Medications  sodium chloride flush (NS) 0.9 % injection 3 mL (has no administration in time range)    ED Course  I have reviewed the triage vital signs and the nursing notes.  Pertinent labs & imaging results that were available during my care of  the patient were reviewed by me and considered in my medical decision making (see chart for details).    MDM Rules/Calculators/A&P                      This patient complains of racing heart, this involves an extensive number of treatment options, and is a complaint that carries with it a high risk of complications and morbidity.  The differential diagnosis includes afib, anxiety, palpitations  Pertinent Labs I ordered, reviewed, and interpreted labs, which included CBC which is normal, BMP is normal, INR normal.  Imaging Interpretation I ordered imaging studies which included CXR.  I independently visualized and interpreted the CXR, which showed no consolidation or edema, no evidence of acute process.   Sources Previous records obtained and reviewed, patient was cardioverted in 2019 after a similar presentation.  Consultants none  Critical Interventions  Synchronized cardioversion.  Reassessments After the interventions stated above, I reevaluated the patient and found improved.  NSR on monitor, confirmed with EKG.  Observed in ED for 1 hour without any return of afib rvr.  CHA2DS2/VAS Stroke Risk Points  4  4 >= 2 Points: High Risk  1 - 1.99 Points: Medium Risk  0 Points: Low Risk  The patient's score has not changed in the past year.: No Change    Details   This score determines the patient's risk of having a stroke if the  patient has atrial fibrillation.       Points Metrics  0 Has Congestive Heart Failure:  No    Current as of 3 minutes ago  0 Has Vascular Disease:  No    Current as of 3 minutes ago  1 Has Hypertension:  Yes    Current as of 3 minutes ago  1 Age:  34    Current as of 3 minutes ago  1 Has Diabetes:  Yes     Current as of 3 minutes ago  0 Had Stroke:  No  Had TIA:  No  Had thromboembolism:  No    Current as of 3 minutes ago  1 Female:  Yes    Current as of 3 minutes ago            Final Clinical Impression(s) /  ED Diagnoses Final  diagnoses:  Atrial fibrillation with RVR [facility])    Rx / DC Orders ED Discharge Orders    None       Montine Circle, PA-C 08/25/19 [id number]    Merryl Hacker, MD 08/25/19 [phone]

## 2019-08-26 ENCOUNTER — Telehealth: Payer: Self-pay | Admitting: Medical

## 2019-08-26 ENCOUNTER — Ambulatory Visit: Payer: PPO | Admitting: Medical

## 2019-08-26 ENCOUNTER — Telehealth: Payer: Self-pay | Admitting: Radiology

## 2019-08-26 ENCOUNTER — Encounter: Payer: Self-pay | Admitting: Medical

## 2019-08-26 VITALS — BP 162/78 | HR 65 | Ht 68.0 in | Wt 167.0 lb

## 2019-08-26 DIAGNOSIS — I77810 Thoracic aortic ectasia: Secondary | ICD-10-CM

## 2019-08-26 DIAGNOSIS — I10 Essential (primary) hypertension: Secondary | ICD-10-CM | POA: Diagnosis not present

## 2019-08-26 DIAGNOSIS — I480 Paroxysmal atrial fibrillation: Secondary | ICD-10-CM

## 2019-08-26 DIAGNOSIS — Z7901 Long term (current) use of anticoagulants: Secondary | ICD-10-CM | POA: Diagnosis not present

## 2019-08-26 DIAGNOSIS — N63 Unspecified lump in unspecified breast: Secondary | ICD-10-CM | POA: Diagnosis not present

## 2019-08-26 MED ORDER — LOSARTAN POTASSIUM 50 MG PO TABS: 50.0000 mg | ORAL_TABLET | Freq: Every day | ORAL | 3 refills | Status: DC

## 2019-08-26 NOTE — Telephone Encounter (Signed)
   Went to chart to check who called pt. transferred to [facility] to schedule EP referral

## 2019-08-26 NOTE — Telephone Encounter (Signed)
Enrolled patient for a 30 day [employer] Event monitor to be mailed to patients home.  

## 2019-08-26 NOTE — Patient Instructions (Addendum)
Medication Instructions:  INCREASE YOUR LOSARTAN TO 50 MG DAILY   *If you need a refill on your cardiac medications before your next appointment, please call your pharmacy*  Lab Work: NONE   Testing/Procedures: Your physician has recommended that you wear an event monitor. Event monitors are medical devices that record the heart's electrical activity. Doctors most often [place] these monitors to diagnose arrhythmias. Arrhythmias are problems with the speed or rhythm of the heartbeat. The monitor is a small, portable device. You can wear one while you do your normal daily activities. This is usually used to diagnose what is causing palpitations/syncope (passing out). THIS WILL BE MAILED TO YOU   Follow-Up: At [facility], you and your health needs are our priority.  As part of our continuing mission to provide you with exceptional heart care, we have created designated Provider Care Teams.  These Care Teams include your primary Cardiologist (physician) and Advanced Practice Providers (APPs -  Physician Assistants and Nurse Practitioners) who all work together to provide you with the care you need, when you need it.  We recommend signing up for the patient portal called "MyChart".  Sign up information is provided on this After Visit Summary.  MyChart is used to connect with patients for Virtual Visits (Telemedicine).  Patients are able to view lab/test results, encounter notes, upcoming appointments, etc.  Non-urgent messages can be sent to your provider as well.   To learn more about what you can do with MyChart, go to [email or web address].    Your next appointment:   3 month(s)  The format for your next appointment:   In Person  Provider:   You may see Sanda Klein, MD or one of the following Advanced Practice Providers on your designated Care Team:    Almyra Deforest, PA-C  Fabian Sharp, [place] or   [employer], PA-C  You have been referred to ELECTROPHYSIOLOGY AT [place] [address]  TO BE SEEN IN ABOUT 6-8 WEEKS SO MONITOR RESULTS WILL BE BACK   Other Instructions MONITOR AND LOG YOUR BLOOD PRESSURE, CALL THE OFFICE IF IT IS NOT CONSISTENTLY BELOW 130/80   [employer] Cardiac Event Monitor Instructions Your physician has requested you wear your cardiac event monitor for __30___ days, (1-30). [employer] may call or text to confirm a shipping address. The monitor will be sent to a land address via UPS. [employer] will not ship a monitor to a PO BOX. It typically takes 3-5 days to receive your monitor after it has been enrolled. [employer] will assist with USPS tracking if your package is delayed. The telephone number for [employer] is [phone]. Once you have received your monitor, please review the enclosed instructions. Instruction tutorials can also be viewed under help and settings on the enclosed cell phone. Your monitor has already been registered assigning a specific monitor serial # to you.  Applying the monitor Remove cell phone from case and turn it on. The cell phone works as Dealer and needs to be within [employer] of you at all times. The cell phone will need to be charged on a daily basis. We recommend you plug the cell phone into the enclosed charger at your bedside table every night.  Monitor batteries: You will receive two monitor batteries labelled #1 and #2. These are your recorders. Plug battery #2 onto the second connection on the enclosed charger. Keep one battery on the charger at all times. This will keep the monitor battery deactivated. It will also keep  it fully charged for when you need to switch your monitor batteries. A small light will be blinking on the battery emblem when it is charging. The light on the battery emblem will remain on when the battery is fully charged.  Open package of a Monitor strip. Insert battery #1 into black hood on strip and gently squeeze monitor battery onto connection as indicated in instruction  booklet. Set aside while preparing skin.  Choose location for your strip, vertical or horizontal, as indicated in the instruction booklet. Shave to remove all hair from location. There cannot be any lotions, oils, powders, or colognes on skin where monitor is to be applied. Wipe skin clean with enclosed Saline wipe. Dry skin completely.  Peel paper labeled #1 off the back of the Monitor strip exposing the adhesive. Place the monitor on the chest in the vertical or horizontal position shown in the instruction booklet. One arrow on the monitor strip must be pointing upward. Carefully remove paper labeled #2, attaching remainder of strip to your skin. Try not to create any folds or wrinkles in the strip as you apply it.  Firmly press and release the circle in the center of the monitor battery. You will hear a small beep. This is turning the monitor battery on. The heart emblem on the monitor battery will light up every 5 seconds if the monitor battery in turned on and connected to the patient securely. Do not push and hold the circle down as this turns the monitor battery off. The cell phone will locate the monitor battery. A screen will appear on the cell phone checking the connection of your monitor strip. This may read poor connection initially but change to good connection within the next minute. Once your monitor accepts the connection you will hear a series of 3 beeps followed by a climbing crescendo of beeps. A screen will appear on the cell phone showing the two monitor strip placement options. Touch the picture that demonstrates where you applied the monitor strip.  Your monitor strip and battery are waterproof. You are able to shower, bathe, or swim with the monitor on. They just ask you do not submerge deeper than 3 feet underwater. We recommend removing the monitor if you are swimming in a lake, river, or ocean.  Your monitor battery will need to be switched to a fully charged  monitor battery approximately once a week. The cell phone will alert you of an action which needs to be made.  On the cell phone, tap for details to reveal connection status, monitor battery status, and cell phone battery status. The green dots indicates your monitor is in good status. A red dot indicates there is something that needs your attention.  To record a symptom, click the circle on the monitor battery. In 30-60 seconds a list of symptoms will appear on the cell phone. Select your symptom and tap save. Your monitor will record a sustained or significant arrhythmia regardless of you clicking the button. Some patients do not feel the heart rhythm irregularities. [employer] will notify us of any serious or critical events.  Refer to instruction booklet for instructions on switching batteries, changing strips, the Do not disturb or Pause features, or any additional questions.  Call [employer] at [phone], to confirm your monitor is transmitting and record your baseline. They will answer any questions you may have regarding the monitor instructions at that time.  Returning the monitor to [place] all equipment back into blue box. Peel off  strip of paper to expose adhesive and close box securely. There is a prepaid UPS shipping label on this box. Drop in a UPS drop box, or at a UPS facility like [employer]. You may also contact [employer] to arrange UPS to pick up monitor package at your home.

## 2019-09-01 ENCOUNTER — Ambulatory Visit (HOSPITAL_COMMUNITY): Payer: PPO | Admitting: Physician Assistant

## 2019-09-04 ENCOUNTER — Encounter (INDEPENDENT_AMBULATORY_CARE_PROVIDER_SITE_OTHER): Payer: PPO

## 2019-09-04 DIAGNOSIS — I480 Paroxysmal atrial fibrillation: Secondary | ICD-10-CM

## 2019-09-04 NOTE — Progress Notes (Signed)
[place], Amanda Frost

## 2019-10-07 DIAGNOSIS — D6869 Other thrombophilia: Secondary | ICD-10-CM | POA: Diagnosis not present

## 2019-10-07 DIAGNOSIS — I480 Paroxysmal atrial fibrillation: Secondary | ICD-10-CM | POA: Diagnosis not present

## 2019-10-07 DIAGNOSIS — K219 Gastro-esophageal reflux disease without esophagitis: Secondary | ICD-10-CM | POA: Diagnosis not present

## 2019-10-07 DIAGNOSIS — E1169 Type 2 diabetes mellitus with other specified complication: Secondary | ICD-10-CM | POA: Diagnosis not present

## 2019-10-07 DIAGNOSIS — M810 Age-related osteoporosis without current pathological fracture: Secondary | ICD-10-CM | POA: Diagnosis not present

## 2019-10-07 DIAGNOSIS — H6123 Impacted cerumen, bilateral: Secondary | ICD-10-CM | POA: Diagnosis not present

## 2019-10-07 DIAGNOSIS — F419 Anxiety disorder, unspecified: Secondary | ICD-10-CM | POA: Diagnosis not present

## 2019-10-07 DIAGNOSIS — E785 Hyperlipidemia, unspecified: Secondary | ICD-10-CM | POA: Diagnosis not present

## 2019-10-07 DIAGNOSIS — R42 Dizziness and giddiness: Secondary | ICD-10-CM | POA: Diagnosis not present

## 2019-10-07 DIAGNOSIS — Z7901 Long term (current) use of anticoagulants: Secondary | ICD-10-CM | POA: Diagnosis not present

## 2019-10-12 ENCOUNTER — Telehealth: Payer: Self-pay | Admitting: *Deleted

## 2019-10-12 NOTE — Telephone Encounter (Deleted)
Hi Ms. Bottoms - Your heart monitor showed no evidence or recurrent atrial fibrillation or other dangerous abnormal heart rhythms. There were a few episodes of brief fast heart beats which correlated with your symptoms as well as occasional early heart beats which also correlated with your symptoms. I would like to increase your metoprolol to 50mg  two times per day to see if that helps. I see y .Marland KitchenMarland Kitchen

## 2019-10-12 NOTE — Telephone Encounter (Signed)
Spoke with pt, she is aware of the notes from monitor, she reports her medical doctor told her not to increase the metoprolol because her heart rate is usually in the 50's at bedtime and her bp is usually 130/70's. She has a follow up appointment with dr Curt Bears next week to go over the monitor. advised with make Daleen Snook aware and to keep all her medications the same until seen by dr Curt Bears unless we call her back. Pt agreed with this plan. Will make krista aware,

## 2019-10-12 NOTE — Telephone Encounter (Addendum)
-----   Message from Abigail Butts, PA-C sent at 10/09/2019  2:26 PM EDT ----- See MyChart message. Please send Rx for metoprolol succinate 50mg  BID. Thanks!  Hi Amanda Frost - Your heart monitor showed no evidence or recurrent atrial fibrillation or other dangerous abnormal heart rhythms. There were a few episodes of brief fast heart beats which correlated with your symptoms as well as occasional early heart beats which also correlated with your symptoms. I would like to increase your metoprolol to 50mg  two times per day to see if that helps. I see y .Marland KitchenMarland Kitchen

## 2019-10-19 ENCOUNTER — Ambulatory Visit (INDEPENDENT_AMBULATORY_CARE_PROVIDER_SITE_OTHER): Payer: PPO | Admitting: Cardiology

## 2019-10-19 ENCOUNTER — Encounter: Payer: Self-pay | Admitting: Cardiology

## 2019-10-19 ENCOUNTER — Other Ambulatory Visit: Payer: Self-pay

## 2019-10-19 VITALS — BP 142/80 | HR 69 | Ht 68.0 in | Wt 165.0 lb

## 2019-10-19 DIAGNOSIS — I480 Paroxysmal atrial fibrillation: Secondary | ICD-10-CM

## 2019-10-19 MED ORDER — FLECAINIDE ACETATE 50 MG PO TABS
50.0000 mg | ORAL_TABLET | Freq: Two times a day (BID) | ORAL | 3 refills | Status: DC
Start: 2019-10-19 — End: 2019-10-26

## 2019-10-19 NOTE — Progress Notes (Signed)
Electrophysiology Office Note   Date:  10/19/2019   ID:  Amanda Frost, DOB 03/13/1951, MRN [id number]  PCP:  Crist Infante, MD  Cardiologist:  [place] Primary Electrophysiologist:  Aastha Dayley Meredith Leeds, MD    Chief Complaint: palpitations   History of Present Illness: Amanda Frost is a 69 y.o. female who is being seen today for the evaluation of palpitations at the request of Kroeger, Krista M., PA-C. Presenting today for electrophysiology evaluation.  She has a history significant for paroxysmal atrial fibrillation, atrial tachycardia, hypertension, cirrhosis secondary to nonalcoholic steatohepatitis.  She had a cardioversion 11/22/2017 after she went to the emergency room in rapid atrial fibrillation.  She is currently on Eliquis.  She had a coronary CT that showed no evidence of coronary artery disease.  She was seen in the emergency room 08/25/2019 with recurrent atrial fibrillation which woke her from sleep.  She had cardioversion at that time.  This is her fourth episode since her diagnosis in 2016.  She has brief palpitations 3-4 times per week lasting from seconds to minutes.  They relieved with coughing.  She also has palpitation 2-3 times a month which lasts up to an hour.  Today, she denies symptoms of palpitations, chest pain, shortness of breath, orthopnea, PND, lower extremity edema, claudication, dizziness, presyncope, syncope, bleeding, or neurologic sequela. The patient is tolerating medications without difficulties.    Past Medical History:  Diagnosis Date  . Hypertension   . 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 Ocular migraine   . Palpitations    PAC's,PVC's,atrial tachycardia  . Paroxysmal atrial fibrillation [facility])    Past Surgical History:  Procedure Laterality Date  . BREAST BIOPSY  1994  . CARDIAC CATHETERIZATION  11/05/2007   normal LV fx,patent coronary arteries  . NM MYOVIEW LTD  03/12/2005   normal  . US  ECHOCARDIOGRAPHY  11/26/2007   mild LVH,trace MR,TR     Current Outpatient Medications  Medication Sig Dispense Refill  . acetaminophen (TYLENOL) 500 MG tablet Take 500 mg by mouth daily as needed.    Marland Kitchen adapalene (DIFFERIN) 0.1 % cream Apply 1 application topically daily.    . Ascorbic Acid (VITAMIN C) 100 MG tablet Take 100 mg by mouth daily.    . cephALEXin (KEFLEX) 250 MG capsule Take 250 mg by mouth daily.    . Cholecalciferol (VITAMIN D3) 5000 units CAPS Take 2 capsules by mouth every Monday, Tuesday, Wednesday, Thursday, and Friday.     Marland Kitchen ELIQUIS 5 MG TABS tablet TAKE 1 TABLET BY MOUTH TWICE A DAY 60 tablet 5  . fexofenadine (ALLEGRA) 180 MG tablet Take 180 mg by mouth daily as needed for allergies or rhinitis.    Marland Kitchen ibandronate (BONIVA) 150 MG tablet Take 150 mg by mouth every 30 (thirty) days.    Marland Kitchen losartan (COZAAR) 50 MG tablet Take 1 tablet (50 mg total) by mouth daily. 90 tablet 3  . metoprolol succinate (TOPROL-XL) 50 MG 24 hr tablet TAKE 1 (50 MG) BY MOUTH EACH MORNING AND ONE (1/2) TABLET (25 MG) BY MOUTH EACH EVENING. 135 tablet 3  . flecainide (TAMBOCOR) 50 MG tablet Take 1 tablet (50 mg total) by mouth 2 (two) times daily. 60 tablet 3   No current facility-administered medications for this visit.    Allergies:   Macrodantin [nitrofurantoin macrocrystal], Ciprofloxacin, and Sulfa antibiotics   Social History:  The patient  reports that she has never smoked. She has never used smokeless tobacco.  She reports that she does not drink alcohol and does not use drugs.   Family History:  The patient's family history includes Diabetes in her father and mother; Hypertension in her father and mother; Kidney disease in her mother; Migraines in her maternal grandmother, son, and son.    ROS:  Please see the history of present illness.   Otherwise, review of systems is positive for none.   All other systems are reviewed and negative.    PHYSICAL EXAM: VS:  BP (!) 142/80   Pulse 69    Ht 5\' 8"  (1.727 m)   Wt 165 lb (74.8 kg)   SpO2 96%   BMI 25.09 kg/m  , BMI Body mass index is 25.09 kg/m. GEN: Well nourished, well developed, in no acute distress  HEENT: normal  Neck: no JVD, carotid bruits, or masses Cardiac: RRR; no murmurs, rubs, or gallops,no edema  Respiratory:  clear to auscultation bilaterally, normal work of breathing GI: soft, nontender, nondistended, + BS MS: no deformity or atrophy  Skin: warm and dry Neuro:  Strength and sensation are intact Psych: euthymic mood, full affect  EKG:  EKG is ordered today. Personal review of the ekg ordered shows sinus rhythm, rate 69  Recent Labs: 08/25/2019: BUN 17; Creatinine, Ser 0.84; Hemoglobin 13.0; Platelets 220; Potassium 3.5; Sodium 140    Lipid Panel     Component Value Date/Time   CHOL  11/05/2007 0821    136        ATP III CLASSIFICATION:  <200     mg/dL   Desirable  200-239  mg/dL   Borderline High  >=240    mg/dL   High   TRIG 145 11/05/2007 0821   HDL 36 (L) 11/05/2007 0821   CHOLHDL 3.8 11/05/2007 0821   VLDL 29 11/05/2007 0821   LDLCALC  11/05/2007 0821    71        Total Cholesterol/HDL:CHD Risk Coronary Heart Disease Risk Table                     Men   Women  1/2 Average Risk   3.4   3.3     Wt Readings from Last 3 Encounters:  10/19/19 165 lb (74.8 kg)  08/26/19 167 lb (75.8 kg)  08/25/19 165 lb (74.8 kg)      Other studies Reviewed: Additional studies/ records that were reviewed today include: Monitor 10/08/19  Review of the above records today demonstrates:   The dominant rhythm is normal sinus with normal circadian variation.  There are rare episodes of very brief nonsustained ventricular tachycardia, 3-5 beats.  There are rare episodes of brief nonsustained atrial tachycardia, 3-7 beats.  The patient triggered recordings are almost all associated with abnormal rhythm, usually isolated PACs and PVCs  No significant episodes of bradycardia or pauses are  recorded.  There is no atrial fibrillation.    ASSESSMENT AND PLAN:  1.  Paroxysmal atrial fibrillation, atrial tachycardia: Currently on Eliquis.  CHA2DS2-VASc of 3.  She has had 4 episodes in the last 6 years of atrial fibrillation.  Due to that, we Marlee Armenteros plan to treat her more conservatively.  She would like to try medication management initially.  We Juel Ripley thus start her on flecainide 50 mg.  I [place]olis Sloop see her back in 3 months.  We did discuss ablation, but she agrees that medication management with her infrequent episodes is reasonable.  2.  Hypertension: Mildly elevated today.  She brings in blood  pressure recordings from home that are normal.  No changes.  Case discussed with primary cardiology  Current medicines are reviewed at length with the patient today.   The patient does not have concerns regarding her medicines.  The following changes were made today: Start flecainide  Labs/ tests ordered today include:  Orders Placed This Encounter  Procedures  . EKG 12-Lead     Disposition:   FU with Graceanne Guin 3 months  Signed, Ardel Jagger Meredith Leeds, MD  10/19/2019 11:30 AM     [facility] [address] [place] [address] [phone] (office) [phone] (fax)

## 2019-10-19 NOTE — Patient Instructions (Signed)
Medication Instructions:  Your physician has recommended you make the following change in your medication:  1. START Flecainide 50 mg twice a day  *If you need a refill on your cardiac medications before your next appointment, please call your pharmacy*   Lab Work: None ordered   Testing/Procedures: None ordered   Follow-Up: Your physician recommends that you schedule a follow-up appointment in: 1 week for nurse visit EKG.  At [facility], you and your health needs are our priority.  As part of our continuing mission to provide you with exceptional heart care, we have created designated Provider Care Teams.  These Care Teams include your primary Cardiologist (physician) and Advanced Practice Providers (APPs -  Physician Assistants and Nurse Practitioners) who all work together to provide you with the care you need, when you need it.  Your next appointment:   3 month(s)  The format for your next appointment:   In Person  Provider:   Allegra Lai, MD   Thank you for choosing [facility]!!   Trinidad Curet, RN [phone]    Other Instructions  Flecainide tablets What is this medicine? FLECAINIDE (FLEK a nide) is an antiarrhythmic drug. This medicine is used to prevent irregular heart rhythm. It can also slow down fast heartbeats called tachycardia. This medicine may be used for other purposes; ask your health care provider or pharmacist if you have questions. COMMON BRAND NAME(S): Tambocor What should I tell my health care provider before I take this medicine? They need to know if you have any of these conditions:  abnormal levels of potassium in the blood  heart disease including heart rhythm and heart rate problems  kidney or liver disease  recent heart attack  an unusual or allergic reaction to flecainide, local anesthetics, other medicines, foods, dyes, or preservatives  pregnant or trying to get pregnant  breast-feeding How should I use this  medicine? Take this medicine by mouth with a glass of water. Follow the directions on the prescription label. You can take this medicine with or without food. Take your doses at regular intervals. Do not take your medicine more often than directed. Do not stop taking this medicine suddenly. This may cause serious, heart-related side effects. If your doctor wants you to stop the medicine, the dose may be slowly lowered over time to avoid any side effects. Talk to your pediatrician regarding the use of this medicine in children. While this drug may be prescribed for children as young as 1 year of age for selected conditions, precautions do apply. Overdosage: If you think you have taken too much of this medicine contact a poison control center or emergency room at once. NOTE: This medicine is only for you. Do not share this medicine with others. What if I miss a dose? If you miss a dose, take it as soon as you can. If it is almost time for your next dose, take only that dose. Do not take double or extra doses. What may interact with this medicine? Do not take this medicine with any of the following medications:  amoxapine  arsenic trioxide  certain antibiotics like clarithromycin, erythromycin, gatifloxacin, gemifloxacin, levofloxacin, moxifloxacin, sparfloxacin, or troleandomycin  certain antidepressants called tricyclic antidepressants like amitriptyline, imipramine, or nortriptyline  certain medicines to control heart rhythm like disopyramide, encainide, moricizine, procainamide, propafenone, and quinidine  cisapride  delavirdine  droperidol  haloperidol  haw[place]orn  imatinib  levomethadyl  maprotiline  medicines for malaria like chloroquine and halofantrine  pentamidine  phenothiazines  like chlorpromazine, mesoridazine, prochlorperazine, thioridazine  pimozide  quinine  ranolazine  ritonavir  sertindole This medicine may also interact with the following  medications:  cimetidine  dofetilide  medicines for angina or high blood pressure  medicines to control heart rhythm like amiodarone and digoxin  ziprasidone This list may not describe all possible interactions. Give your health care provider a list of all the medicines, herbs, non-prescription drugs, or dietary supplements you use. Also tell them if you smoke, drink alcohol, or use illegal drugs. Some items may interact with your medicine. What should I watch for while using this medicine? Visit your doctor or health care professional for regular checks on your progress. Because your condition and the use of this medicine carries some risk, it is a good idea to carry an identification card, necklace or bracelet with details of your condition, medications and doctor or health care professional. Check your blood pressure and pulse rate regularly. Ask your health care professional what your blood pressure and pulse rate should be, and when you should contact him or her. Your doctor or health care professional also may schedule regular blood tests and electrocardiograms to check your progress. You may get drowsy or dizzy. Do not drive, use machinery, or do anything that needs mental alertness until you know how this medicine affects you. Do not stand or sit up quickly, especially if you are an older patient. This reduces the risk of dizzy or fainting spells. Alcohol can make you more dizzy, increase flushing and rapid heartbeats. Avoid alcoholic drinks. What side effects may I notice from receiving this medicine? Side effects that you should report to your doctor or health care professional as soon as possible:  chest pain, continued irregular heartbeats  difficulty breathing  swelling of the legs or feet  trembling, shaking  unusually weak or tired Side effects that usually do not require medical attention (report to your doctor or health care professional if they continue or are  bothersome):  blurred vision  constipation  headache  nausea, vomiting  stomach pain This list may not describe all possible side effects. Call your doctor for medical advice about side effects. You may report side effects to FDA at 1-800-FDA-1088. Where should I keep my medicine? Keep out of the reach of children. Store at room temperature between 15 and 30 degrees C (59 and 86 degrees F). Protect from light. Keep container tightly closed. Throw away any unused medicine after the expiration date. NOTE: This sheet is a summary. It may not cover all possible information. If you have questions about this medicine, talk to your doctor, pharmacist, or health care provider.  2020 Elsevier/Gold Standard (2018-03-10 11:41:38)

## 2019-10-21 ENCOUNTER — Telehealth: Payer: Self-pay | Admitting: Cardiology

## 2019-10-21 NOTE — Telephone Encounter (Signed)
Pt c/o medication issue:  1. Name of Medication: flecainide (TAMBOCOR) 50 MG tablet  2. How are you currently taking this medication (dosage and times per day)? n/a  3. Are you having a reaction (difficulty breathing--STAT)? No  4. What is your medication issue? Per patient, husband is very concerned about the side effects. She states he wants to know why is she starting this medication now and is it necessary that she start it now? Please advise.

## 2019-10-21 NOTE — Telephone Encounter (Signed)
Left message to call office

## 2019-10-21 NOTE — Telephone Encounter (Signed)
I spoke with patient. She was recently prescribed flecainide but has not started yet.  She read side effects and was concerned about QT prolongation.  I explained to her she has been scheduled for an EKG one week after starting medication. She reports she is afraid to take a lot of medications and is asking if Dr Curt Bears feels she needs to start flecainide at this time or if she could wait on it.  I told her I would send message to Dr Curt Bears.  Patient aware timing of EKG will need to be changed based on starting of flecainide.

## 2019-10-22 NOTE — Telephone Encounter (Signed)
Will await Dr. Curt Bears recommendation prior to returning call to pt.

## 2019-10-22 NOTE — Telephone Encounter (Signed)
Patient called back to follow up.

## 2019-10-26 ENCOUNTER — Telehealth: Payer: Self-pay | Admitting: Cardiology

## 2019-10-26 NOTE — Telephone Encounter (Signed)
Pt aware that Dr. Curt Bears is agreeable to holding off on starting Flecainide for now. Will continue to monitor for now. Pt aware that if episodes occur for longer periods/more frequently and/or she experiences symptoms then she should call and let us know.  Keep f/u w/ Camnitz in October. Patient verbalized understanding and agreeable to plan.

## 2019-10-26 NOTE — Telephone Encounter (Signed)
Connected patient to Sherri to discuss medication

## 2019-10-27 ENCOUNTER — Ambulatory Visit: Payer: PPO

## 2019-11-05 ENCOUNTER — Ambulatory Visit: Payer: PPO | Admitting: Cardiovascular Disease

## 2019-12-10 ENCOUNTER — Other Ambulatory Visit: Payer: Self-pay

## 2019-12-10 ENCOUNTER — Encounter: Payer: Self-pay | Admitting: Cardiovascular Disease

## 2019-12-10 ENCOUNTER — Ambulatory Visit: Payer: PPO | Admitting: Cardiovascular Disease

## 2019-12-10 VITALS — BP 135/82 | HR 68 | Ht 68.0 in | Wt 166.8 lb

## 2019-12-10 DIAGNOSIS — I480 Paroxysmal atrial fibrillation: Secondary | ICD-10-CM | POA: Diagnosis not present

## 2019-12-10 DIAGNOSIS — I10 Essential (primary) hypertension: Secondary | ICD-10-CM

## 2019-12-10 DIAGNOSIS — Z7901 Long term (current) use of anticoagulants: Secondary | ICD-10-CM | POA: Diagnosis not present

## 2019-12-10 NOTE — Progress Notes (Signed)
Patient ID: Amanda Frost, female   DOB: Mar 08, 1951, 69 y.o.   MRN: [id number]     Cardiology Office Note    Date:  12/10/2019   ID:  Amanda Frost, DOB 02/19/1951, MRN [id number]  PCP:  Crist Infante, MD  Cardiologist:   Sanda Klein, MD   Chief Complaint  Patient presents with   Atrial Fibrillation    History of Present Illness:  Amanda Frost is a 69 y.o. 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   She has had a total of 4 episodes of symptomatic A. fib since the initial diagnosis in 2016.  She had cardioversion in August 2019 for symptomatic rapid atrial fibrillation.  On another occasion she received flecainide, with conversion to normal rhythm occurring almost 4 hours after its administration making it unclear whether it was spontaneous conversion or chemically induced cardioversion.  Since then she has had infrequent palpitation events usually very brief (3-4 times a week lasting for a few seconds once every month or so lasting for about an hour).  On May 25 she was awoken from sleep with rapid atrial fibrillation and again had cardioversion in the emergency room.  She has been compliant with Eliquis and other than occasional hemorrhoidal bleeding has not had any bleeding complications.  She has not had falls or injuries.  She has not had stroke or TIA.  In July 2021 she had normal sinus rhythm, rare episodes of brief nonsustained atrial tachycardia and rare episodes of very brief nonsustained VT (maximum 5 beats).  The symptomatic recordings all correlated with isolated PACs and PVCs.  She did not have any bradycardia problems.  She saw Dr. Curt Bears on July 19 to discuss additional treatment for atrial fibrillation.  He recommended treatment with flecainide 50 mg.  She was worried  about side effects and has not really been taking it.  She continues to take metoprolol.  Ablation was discussed but both Dr. Curt Bears and the patient agreed that her spells are too infrequent to justify aggressive intervention at this time.  The patient specifically denies any chest pain at rest exertion, dyspnea at rest or with exertion, orthopnea, paroxysmal nocturnal dyspnea, syncope, palpitations, focal neurological deficits, intermittent claudication, lower extremity edema, unexplained weight gain, cough, hemoptysis or wheezing.  She is under quite a bit of emotional stress.  Mr. & Mrs. Vittitow are raising their granddaughter Amanda Frost, since their son is having to deal with problems related to substance abuse.   Past Medical History:  Diagnosis Date   Hypertension    Liver cirrhosis secondary to NASH (nonalcoholic steatohepatitis) (HCC)    Ocular migraine    Palpitations    PAC's,PVC's,atrial tachycardia   Paroxysmal atrial fibrillation (HCC)     Past Surgical History:  Procedure Laterality Date   BREAST BIOPSY  1994   CARDIAC CATHETERIZATION  11/05/2007   normal LV fx,patent coronary arteries   NM MYOVIEW LTD  03/12/2005   normal   US ECHOCARDIOGRAPHY  11/26/2007   mild LVH,trace MR,TR    Outpatient Medications Prior to Visit  Medication Sig Dispense Refill   acetaminophen (TYLENOL) 500 MG tablet Take 500 mg by mouth daily as needed.     adapalene (DIFFERIN) 0.1 % cream Apply 1 application topically daily.     Ascorbic Acid (VITAMIN C) 100 MG tablet Take 100 mg by mouth daily.  cephALEXin (KEFLEX) 250 MG capsule Take 250 mg by mouth daily.     Cholecalciferol (VITAMIN D3) 5000 units CAPS Take 2 capsules by mouth every Monday, Tuesday, Wednesday, Thursday, and Friday.      ELIQUIS 5 MG TABS tablet TAKE 1 TABLET BY MOUTH TWICE A DAY 60 tablet 5   fexofenadine (ALLEGRA) 180 MG tablet Take 180 mg by mouth daily as needed for allergies or rhinitis.      ibandronate (BONIVA) 150 MG tablet Take 150 mg by mouth every 30 (thirty) days.     losartan (COZAAR) 50 MG tablet Take 1 tablet (50 mg total) by mouth daily. 90 tablet 3   metoprolol succinate (TOPROL-XL) 50 MG 24 hr tablet TAKE 1 (50 MG) BY MOUTH EACH MORNING AND ONE (1/2) TABLET (25 MG) BY MOUTH EACH EVENING. 135 tablet 3   No facility-administered medications prior to visit.     Allergies:   Macrodantin [nitrofurantoin macrocrystal], Ciprofloxacin, and Sulfa antibiotics   Social History   Socioeconomic History   Marital status: Married    Spouse name: Not on file   Number of children: 4   Years of education: Not on file   Highest education level: Some college, no degree  Occupational History   Not on file  Tobacco Use   Smoking status: Never Smoker   Smokeless tobacco: Never Used  Vaping Use   Vaping Use: Never used  Substance and Sexual Activity   Alcohol use: No    Alcohol/week: 0.0 standard drinks   Drug use: No   Sexual activity: Not on file  Other Topics Concern   Not on file  Social History Narrative   Pt lives in [place] with spouse and 65 yr old granddaughter.  Retired Control and instrumentation engineer (kindergarten)   Right handed   Caffeine: tea, 1 cup/day   Social Determinants of Radio broadcast assistant Strain:    Difficulty of Paying Living Expenses: Not on file  Food Insecurity:    Worried About Charity fundraiser in the Last Year: Not on file   [employer] of Food in the Last Year: Not on file  Transportation Needs:    Lack of Transportation (Medical): Not on file   Lack of Transportation (Non-Medical): Not on file  Physical Activity:    Days of Exercise per Week: Not on file   Minutes of Exercise per Session: Not on file  Stress:    Feeling of Stress : Not on file  Social Connections:    Frequency of Communication with Friends and Family: Not on file   Frequency of Social Gatherings with Friends and Family: Not on file   Attends  Religious Services: Not on file   Active Member of Clubs or Organizations: Not on file   Attends Archivist Meetings: Not on file   Marital Status: Not on file     Family History:  The patient's family history includes Diabetes in her father and mother; Hypertension in her father and mother; Kidney disease in her mother; Migraines in her maternal grandmother, son, and son.   ROS:   Please see the history of present illness.    ROS All other systems are reviewed and are negative.   PHYSICAL EXAM:   VS:  BP 135/82    Pulse 68    Ht 5\' 8"  (1.727 m)    Wt 166 lb 12.8 oz (75.7 kg)    SpO2 98%    BMI 25.36 kg/m  General: Alert, oriented x3, no distress Head: no evidence of trauma, PERRL, EOMI, no exophtalmos or lid lag, no myxedema, no xanthelasma; normal ears, nose and oropharynx Neck: normal jugular venous pulsations and no hepatojugular reflux; brisk carotid pulses without delay and no carotid bruits Chest: clear to auscultation, no signs of consolidation by percussion or palpation, normal fremitus, symmetrical and full respiratory excursions Cardiovascular: normal position and quality of the apical impulse, regular rhythm, normal first and second heart sounds, no murmurs, rubs or gallops Abdomen: no tenderness or distention, no masses by palpation, no abnormal pulsatility or arterial bruits, normal bowel sounds, no hepatosplenomegaly Extremities: no clubbing, cyanosis or edema; 2+ radial, ulnar and brachial pulses bilaterally; 2+ right femoral, posterior tibial and dorsalis pedis pulses; 2+ left femoral, posterior tibial and dorsalis pedis pulses; no subclavian or femoral bruits Neurological: grossly nonfocal Psych: Normal mood and affect  Wt Readings from Last 3 Encounters:  12/10/19 166 lb 12.8 oz (75.7 kg)  10/19/19 165 lb (74.8 kg)  08/26/19 167 lb (75.8 kg)      Studies/Labs Reviewed:   EKG:  EKG ordered today shows normal sinus rhythm, normal tracing, QTC 421  ms  Recent Labs: 08/25/2019: BUN 17; Creatinine, Ser 0.84; Hemoglobin 13.0; Platelets 220; Potassium 3.5; Sodium 140   Lipid Panel    Component Value Date/Time   CHOL  11/05/2007 0821    136        ATP III CLASSIFICATION:  <200     mg/dL   Desirable  200-239  mg/dL   Borderline High  >=240    mg/dL   High   TRIG 145 11/05/2007 0821   HDL 36 (L) 11/05/2007 0821   CHOLHDL 3.8 11/05/2007 0821   VLDL 29 11/05/2007 0821   LDLCALC  11/05/2007 0821    71        Total Cholesterol/HDL:CHD Risk Coronary Heart Disease Risk Table                     Men   Women  1/2 Average Risk   3.4   3.3     ASSESSMENT:    1. Paroxysmal atrial fibrillation (HCC)   2. Essential hypertension   3. Long term current use of anticoagulant      PLAN:  In order of problems listed above:  1. AFib: Although her atrial fibrillation are symptomatic, they are quite infrequent (4 times in the 5 years).  More commonly her palpitations appear to be due to isolated ectopic beats.  She has been compliant with direct oral anticoagulant therapy. CHADSVasc 3  (age, gender, HTN).  Reviewed the management of single episodes of atrial fibrillation by relaxing, taking an extra dose of beta-blocker and waiting for a few hours.  However she should seek urgent medical attention if she has angina, severe dyspnea or presyncope/syncope.  We have options for ablation or antiarrhythmic therapy if the frequency range.  2. HTN: Diastolic blood pressure is high today, but usually much better.  Continue current medications.  3. Anticoagulation: Continue to briefly discontinue Eliquis for 24-48 hours during periods of heavy hemorrhoidal bleeding.  Stroke/TIA risk is relatively low.  Medication Adjustments/Labs and Tests Ordered: Current medicines are reviewed at length with the patient today.  Concerns regarding medicines are outlined above.  Medication changes, Labs and Tests ordered today are listed in the Patient Instructions  below. Patient Instructions  Medication Instructions:  No changes-Eliquis 5 mg samples given (2 boxes) *If you need a refill on your  cardiac medications before your next appointment, please call your pharmacy*   Lab Work: None ordered If you have labs (blood work) drawn today and your tests are completely normal, you will receive your results only by:  [place] (if you have MyChart) OR  A paper copy in the mail If you have any lab test that is abnormal or we need to change your treatment, we will call you to review the results.   Testing/Procedures: None ordered   Follow-Up: At [facility], you and your health needs are our priority.  As part of our continuing mission to provide you with exceptional heart care, we have created designated Provider Care Teams.  These Care Teams include your primary Cardiologist (physician) and Advanced Practice Providers (APPs -  Physician Assistants and Nurse Practitioners) who all work together to provide you with the care you need, when you need it.  We recommend signing up for the patient portal called "MyChart".  Sign up information is provided on this After Visit Summary.  MyChart is used to connect with patients for Virtual Visits (Telemedicine).  Patients are able to view lab/test results, encounter notes, upcoming appointments, etc.  Non-urgent messages can be sent to your provider as well.   To learn more about what you can do with MyChart, go to [email or web address].    Your next appointment:   12 month(s)  The format for your next appointment:   In Person  Provider:   You may see Sanda Klein, MD or one of the following Advanced Practice Providers on your designated Care Team:    Almyra Deforest, PA-C  Fabian Sharp, [place] or   Roby Lofts, PA-C        Signed, Sanda Klein, MD  12/10/2019 9:59 AM    [facility] [address], [place], [place]  [address] Phone: [phone]; Fax: [phone]

## 2019-12-10 NOTE — Patient Instructions (Addendum)
Medication Instructions:  No changes-Eliquis 5 mg samples given (2 boxes) *If you need a refill on your cardiac medications before your next appointment, please call your pharmacy*   Lab Work: None ordered If you have labs (blood work) drawn today and your tests are completely normal, you will receive your results only by: [place] MyChart Message (if you have MyChart) OR . A paper copy in the mail If you have any lab test that is abnormal or we need to change your treatment, we will call you to review the results.   Testing/Procedures: None ordered   Follow-Up: At [facility], you and your health needs are our priority.  As part of our continuing mission to provide you with exceptional heart care, we have created designated Provider Care Teams.  These Care Teams include your primary Cardiologist (physician) and Advanced Practice Providers (APPs -  Physician Assistants and Nurse Practitioners) who all work together to provide you with the care you need, when you need it.  We recommend signing up for the patient portal called "MyChart".  Sign up information is provided on this After Visit Summary.  MyChart is used to connect with patients for Virtual Visits (Telemedicine).  Patients are able to view lab/test results, encounter notes, upcoming appointments, etc.  Non-urgent messages can be sent to your provider as well.   To learn more about what you can do with MyChart, go to [email or web address].    Your next appointment:   12 month(s)  The format for your next appointment:   In Person  Provider:   You may see Sanda Klein, MD or one of the following Advanced Practice Providers on your designated Care Team:    Almyra Deforest, PA-C  Fabian Sharp, PA-C or   Roby Lofts, [place]

## 2019-12-21 ENCOUNTER — Other Ambulatory Visit: Payer: Self-pay | Admitting: Obstetrics and Gynecology

## 2019-12-21 ENCOUNTER — Ambulatory Visit
Admission: RE | Admit: 2019-12-21 | Discharge: 2019-12-21 | Disposition: A | Discharge status: Home / Self Care | Payer: PPO | Source: Ambulatory Visit | Attending: Obstetrics and Gynecology | Admitting: Obstetrics and Gynecology

## 2019-12-21 ENCOUNTER — Other Ambulatory Visit: Payer: Self-pay

## 2019-12-21 DIAGNOSIS — R921 Mammographic calcification found on diagnostic imaging of breast: Secondary | ICD-10-CM | POA: Diagnosis not present

## 2019-12-24 DIAGNOSIS — Z124 Encounter for screening for malignant neoplasm of cervix: Secondary | ICD-10-CM | POA: Diagnosis not present

## 2019-12-24 DIAGNOSIS — Z6825 Body mass index (BMI) 25.0-25.9, adult: Secondary | ICD-10-CM | POA: Diagnosis not present

## 2019-12-28 ENCOUNTER — Other Ambulatory Visit: Payer: Self-pay

## 2019-12-28 ENCOUNTER — Ambulatory Visit
Admission: RE | Admit: 2019-12-28 | Discharge: 2019-12-28 | Disposition: A | Discharge status: Home / Self Care | Payer: PPO | Source: Ambulatory Visit | Attending: Obstetrics and Gynecology | Admitting: Obstetrics and Gynecology

## 2019-12-28 DIAGNOSIS — R921 Mammographic calcification found on diagnostic imaging of breast: Secondary | ICD-10-CM

## 2019-12-28 DIAGNOSIS — N6311 Unspecified lump in the right breast, upper outer quadrant: Secondary | ICD-10-CM | POA: Diagnosis not present

## 2019-12-28 DIAGNOSIS — D241 Benign neoplasm of right breast: Secondary | ICD-10-CM | POA: Diagnosis not present

## 2020-01-05 DIAGNOSIS — R87615 Unsatisfactory cytologic smear of cervix: Secondary | ICD-10-CM | POA: Diagnosis not present

## 2020-01-10 ENCOUNTER — Other Ambulatory Visit: Payer: Self-pay | Admitting: Cardiology

## 2020-01-12 DIAGNOSIS — Z03818 Encounter for observation for suspected exposure to other biological agents ruled out: Secondary | ICD-10-CM | POA: Diagnosis not present

## 2020-01-12 DIAGNOSIS — Z20822 Contact with and (suspected) exposure to covid-19: Secondary | ICD-10-CM | POA: Diagnosis not present

## 2020-01-19 ENCOUNTER — Ambulatory Visit: Payer: PPO | Admitting: Cardiology

## 2020-01-20 DIAGNOSIS — M65342 Trigger finger, left ring finger: Secondary | ICD-10-CM | POA: Diagnosis not present

## 2020-02-10 DIAGNOSIS — M65342 Trigger finger, left ring finger: Secondary | ICD-10-CM | POA: Diagnosis not present

## 2020-02-11 ENCOUNTER — Other Ambulatory Visit: Payer: Self-pay

## 2020-02-11 MED ORDER — APIXABAN 5 MG PO TABS: 5.0000 mg | ORAL_TABLET | Freq: Two times a day (BID) | ORAL | 1 refills | Status: DC

## 2020-03-02 DIAGNOSIS — G43B0 Ophthalmoplegic migraine, not intractable: Secondary | ICD-10-CM | POA: Diagnosis not present

## 2020-03-02 DIAGNOSIS — H43813 Vitreous degeneration, bilateral: Secondary | ICD-10-CM | POA: Diagnosis not present

## 2020-03-10 DIAGNOSIS — Z23 Encounter for immunization: Secondary | ICD-10-CM | POA: Diagnosis not present

## 2020-03-21 DIAGNOSIS — M65342 Trigger finger, left ring finger: Secondary | ICD-10-CM | POA: Diagnosis not present

## 2020-04-12 ENCOUNTER — Telehealth: Payer: Self-pay | Admitting: Cardiovascular Disease

## 2020-04-12 DIAGNOSIS — I83813 Varicose veins of bilateral lower extremities with pain: Secondary | ICD-10-CM | POA: Diagnosis not present

## 2020-04-12 DIAGNOSIS — M79604 Pain in right leg: Secondary | ICD-10-CM | POA: Diagnosis not present

## 2020-04-12 DIAGNOSIS — M79605 Pain in left leg: Secondary | ICD-10-CM | POA: Diagnosis not present

## 2020-04-12 NOTE — Telephone Encounter (Signed)
Spoke to patient she wanted to know if ok to take generic Eliquis.Advised generic is not available yet.Advised she can check with her pharmacy to find out when it will become available.

## 2020-04-12 NOTE — Telephone Encounter (Signed)
Pt c/o medication issue:  1. Name of Medication: apixaban (ELIQUIS) 5 MG TABS tablet  2. How are you currently taking this medication (dosage and times per day)? As directed  3. Are you having a reaction (difficulty breathing--STAT)? no  4. What is your medication issue? Patient wanted to know if she could take the generic version  of this medication. She also wanted to know if it is as good as the name brand. Please advise

## 2020-04-21 DIAGNOSIS — J069 Acute upper respiratory infection, unspecified: Secondary | ICD-10-CM | POA: Diagnosis not present

## 2020-04-21 DIAGNOSIS — R059 Cough, unspecified: Secondary | ICD-10-CM | POA: Diagnosis not present

## 2020-05-10 DIAGNOSIS — J302 Other seasonal allergic rhinitis: Secondary | ICD-10-CM | POA: Diagnosis not present

## 2020-05-10 DIAGNOSIS — K219 Gastro-esophageal reflux disease without esophagitis: Secondary | ICD-10-CM | POA: Diagnosis not present

## 2020-05-10 DIAGNOSIS — R490 Dysphonia: Secondary | ICD-10-CM | POA: Diagnosis not present

## 2020-05-10 DIAGNOSIS — J069 Acute upper respiratory infection, unspecified: Secondary | ICD-10-CM | POA: Diagnosis not present

## 2020-05-10 DIAGNOSIS — R059 Cough, unspecified: Secondary | ICD-10-CM | POA: Diagnosis not present

## 2020-05-10 DIAGNOSIS — I10 Essential (primary) hypertension: Secondary | ICD-10-CM | POA: Diagnosis not present

## 2020-05-12 ENCOUNTER — Telehealth: Payer: Self-pay | Admitting: Physician Assistant

## 2020-05-12 NOTE — Telephone Encounter (Signed)
Spoke with patient regarding the scheduled  CTA chest aorta appointment on 05/24/20 at 10:00 am at [facility].  Arrival time is 9:30 am [address], [address]--patient to come in next week for lab work.  Will mail information to patient and she voiced her understanding

## 2020-05-20 ENCOUNTER — Other Ambulatory Visit: Payer: Self-pay | Admitting: Cardiovascular Disease

## 2020-05-20 DIAGNOSIS — I77810 Thoracic aortic ectasia: Secondary | ICD-10-CM

## 2020-05-20 DIAGNOSIS — Z79899 Other long term (current) drug therapy: Secondary | ICD-10-CM | POA: Diagnosis not present

## 2020-05-21 LAB — BASIC METABOLIC PANEL
BUN/Creatinine Ratio: 9 — ABNORMAL LOW (ref 12–28)
BUN: 8 mg/dL (ref 8–27)
CO2: 24 mmol/L (ref 20–29)
Calcium: 9.7 mg/dL (ref 8.7–10.3)
Chloride: 102 mmol/L (ref 96–106)
Creatinine, Ser: 0.9 mg/dL (ref 0.57–1.00)
GFR calc Af Amer: 75 mL/min/{1.73_m2} (ref >59)
GFR calc non Af Amer: 65 mL/min/{1.73_m2} (ref >59)
Glucose: 98 mg/dL (ref 65–99)
Potassium: 4.6 mmol/L (ref 3.5–5.2)
Sodium: 142 mmol/L (ref 134–144)

## 2020-05-24 ENCOUNTER — Ambulatory Visit (INDEPENDENT_AMBULATORY_CARE_PROVIDER_SITE_OTHER)
Admission: RE | Admit: 2020-05-24 | Discharge: 2020-05-24 | Disposition: A | Discharge status: Home / Self Care | Payer: PPO | Source: Ambulatory Visit | Attending: Physician Assistant | Admitting: Physician Assistant

## 2020-05-24 ENCOUNTER — Other Ambulatory Visit: Payer: Self-pay

## 2020-05-24 DIAGNOSIS — I77810 Thoracic aortic ectasia: Secondary | ICD-10-CM | POA: Diagnosis not present

## 2020-05-24 DIAGNOSIS — R911 Solitary pulmonary nodule: Secondary | ICD-10-CM | POA: Diagnosis not present

## 2020-05-24 DIAGNOSIS — J9811 Atelectasis: Secondary | ICD-10-CM | POA: Diagnosis not present

## 2020-05-24 MED ORDER — IOHEXOL 350 MG/ML SOLN
100.0000 mL | Freq: Once | INTRAVENOUS | Status: AC | PRN
  Administered 2020-05-24: 100 mL via INTRAVENOUS

## 2020-05-25 ENCOUNTER — Telehealth: Payer: Self-pay | Admitting: Cardiovascular Disease

## 2020-05-25 NOTE — Telephone Encounter (Signed)
Spoke to patient stated she read chest ct results on mychart.Stated she was concerned chest ct revealed a lung nodule.Advised Dr.Croitoru out of office this week.Patient reassured advised it was recommended repeating again in 6 to 12 months.Advised I will send message to Dr.Croitoru.

## 2020-05-25 NOTE — Telephone Encounter (Signed)
    Pt would like to speak with Dr. Lurline Del nurse about her CT yesterday, she said everything looks good but they saw a Pulmonary nodule on her left lung and wanted to know what's that mean

## 2020-05-27 ENCOUNTER — Other Ambulatory Visit: Payer: Self-pay | Admitting: Adult Health

## 2020-05-27 NOTE — Telephone Encounter (Signed)
Answered questions about lung nodule found on CT chest. She would like to speak with Dr. Sallyanne Kuster about her scan.

## 2020-06-03 NOTE — Telephone Encounter (Signed)
I called her to discuss the new left lower lobe lung nodule on her CT. I explained this was not  truly a "new" nodule (that area of lung was not included in the field of view on the previous CT). She is not in a high risk category for lung cancer and the nodule is <1 cm. We could reimage that area when she has a repeat CT of the aorta in 12 months.

## 2020-06-07 ENCOUNTER — Other Ambulatory Visit: Payer: Self-pay | Admitting: Internal Medicine

## 2020-06-07 DIAGNOSIS — R490 Dysphonia: Secondary | ICD-10-CM | POA: Diagnosis not present

## 2020-06-07 DIAGNOSIS — R911 Solitary pulmonary nodule: Secondary | ICD-10-CM

## 2020-06-07 DIAGNOSIS — J381 Polyp of vocal cord and larynx: Secondary | ICD-10-CM | POA: Diagnosis not present

## 2020-06-08 DIAGNOSIS — M79602 Pain in left arm: Secondary | ICD-10-CM | POA: Diagnosis not present

## 2020-06-13 DIAGNOSIS — J302 Other seasonal allergic rhinitis: Secondary | ICD-10-CM | POA: Diagnosis not present

## 2020-06-13 DIAGNOSIS — E785 Hyperlipidemia, unspecified: Secondary | ICD-10-CM | POA: Diagnosis not present

## 2020-06-13 DIAGNOSIS — I10 Essential (primary) hypertension: Secondary | ICD-10-CM | POA: Diagnosis not present

## 2020-06-13 DIAGNOSIS — R911 Solitary pulmonary nodule: Secondary | ICD-10-CM | POA: Diagnosis not present

## 2020-06-13 DIAGNOSIS — R490 Dysphonia: Secondary | ICD-10-CM | POA: Diagnosis not present

## 2020-06-27 DIAGNOSIS — I83813 Varicose veins of bilateral lower extremities with pain: Secondary | ICD-10-CM | POA: Diagnosis not present

## 2020-06-29 DIAGNOSIS — K219 Gastro-esophageal reflux disease without esophagitis: Secondary | ICD-10-CM | POA: Diagnosis not present

## 2020-06-29 DIAGNOSIS — R490 Dysphonia: Secondary | ICD-10-CM | POA: Diagnosis not present

## 2020-07-07 DIAGNOSIS — R490 Dysphonia: Secondary | ICD-10-CM | POA: Diagnosis not present

## 2020-07-07 DIAGNOSIS — J381 Polyp of vocal cord and larynx: Secondary | ICD-10-CM | POA: Diagnosis not present

## 2020-07-13 DIAGNOSIS — L649 Androgenic alopecia, unspecified: Secondary | ICD-10-CM | POA: Diagnosis not present

## 2020-07-13 DIAGNOSIS — Z85828 Personal history of other malignant neoplasm of skin: Secondary | ICD-10-CM | POA: Diagnosis not present

## 2020-07-28 ENCOUNTER — Other Ambulatory Visit: Payer: Self-pay

## 2020-07-28 ENCOUNTER — Encounter: Payer: Self-pay | Admitting: Podiatry

## 2020-07-28 ENCOUNTER — Ambulatory Visit: Payer: PPO | Admitting: Podiatry

## 2020-07-28 DIAGNOSIS — M7751 Other enthesopathy of right foot: Secondary | ICD-10-CM | POA: Diagnosis not present

## 2020-07-28 NOTE — Progress Notes (Signed)
  Subjective:  Patient ID: Amanda Frost, female    DOB: 03-02-1951,  MRN: [id number]  Chief Complaint  Patient presents with  . Toe Pain    Place between 4th toe of the right foot     70 y.o. female presents with the above complaint. History confirmed with patient.   Objective:  Physical Exam: warm, good capillary refill, no trophic changes or ulcerative lesions, normal DP and PT pulses and normal sensory exam.  Right Foot: Head of proximal phalanx medial fourth toe has prominent bone spur palpable, minimally painful   Assessment:   1. Bone spur of toe of right foot      Plan:  Patient was evaluated and treated and all questions answered.  Discussed that she actually either has a bone spur of the head of the proximal phalanx or osteoarthritis developing in the joint.  Not currently not bothersome for her.  Discussed with her we could treat surgically or nonsurgically with padding and arthroplasty if not improving.  Not that bothersome for her now from a pain standpoint.  If begins to worsen she will return for follow-up and discuss further treatment  Return if symptoms worsen or fail to improve.

## 2020-08-03 DIAGNOSIS — R7989 Other specified abnormal findings of blood chemistry: Secondary | ICD-10-CM | POA: Diagnosis not present

## 2020-08-03 DIAGNOSIS — E559 Vitamin D deficiency, unspecified: Secondary | ICD-10-CM | POA: Diagnosis not present

## 2020-08-03 DIAGNOSIS — E1169 Type 2 diabetes mellitus with other specified complication: Secondary | ICD-10-CM | POA: Diagnosis not present

## 2020-08-03 DIAGNOSIS — E785 Hyperlipidemia, unspecified: Secondary | ICD-10-CM | POA: Diagnosis not present

## 2020-08-03 DIAGNOSIS — L659 Nonscarring hair loss, unspecified: Secondary | ICD-10-CM | POA: Diagnosis not present

## 2020-08-05 DIAGNOSIS — I87393 Chronic venous hypertension (idiopathic) with other complications of bilateral lower extremity: Secondary | ICD-10-CM | POA: Diagnosis not present

## 2020-08-05 DIAGNOSIS — G2581 Restless legs syndrome: Secondary | ICD-10-CM | POA: Diagnosis not present

## 2020-08-05 DIAGNOSIS — R252 Cramp and spasm: Secondary | ICD-10-CM | POA: Diagnosis not present

## 2020-08-10 DIAGNOSIS — K760 Fatty (change of) liver, not elsewhere classified: Secondary | ICD-10-CM | POA: Diagnosis not present

## 2020-08-10 DIAGNOSIS — D6869 Other thrombophilia: Secondary | ICD-10-CM | POA: Diagnosis not present

## 2020-08-10 DIAGNOSIS — Z0000 Encounter for general adult medical examination without abnormal findings: Secondary | ICD-10-CM | POA: Diagnosis not present

## 2020-08-10 DIAGNOSIS — E119 Type 2 diabetes mellitus without complications: Secondary | ICD-10-CM | POA: Diagnosis not present

## 2020-08-10 DIAGNOSIS — R911 Solitary pulmonary nodule: Secondary | ICD-10-CM | POA: Diagnosis not present

## 2020-08-10 DIAGNOSIS — I700 Atherosclerosis of aorta: Secondary | ICD-10-CM | POA: Diagnosis not present

## 2020-08-10 DIAGNOSIS — E1169 Type 2 diabetes mellitus with other specified complication: Secondary | ICD-10-CM | POA: Diagnosis not present

## 2020-08-10 DIAGNOSIS — E785 Hyperlipidemia, unspecified: Secondary | ICD-10-CM | POA: Diagnosis not present

## 2020-08-10 DIAGNOSIS — Z7901 Long term (current) use of anticoagulants: Secondary | ICD-10-CM | POA: Diagnosis not present

## 2020-08-10 DIAGNOSIS — I10 Essential (primary) hypertension: Secondary | ICD-10-CM | POA: Diagnosis not present

## 2020-08-10 DIAGNOSIS — K219 Gastro-esophageal reflux disease without esophagitis: Secondary | ICD-10-CM | POA: Diagnosis not present

## 2020-08-10 DIAGNOSIS — R82998 Other abnormal findings in urine: Secondary | ICD-10-CM | POA: Diagnosis not present

## 2020-08-10 DIAGNOSIS — M810 Age-related osteoporosis without current pathological fracture: Secondary | ICD-10-CM | POA: Diagnosis not present

## 2020-08-16 ENCOUNTER — Other Ambulatory Visit: Payer: Self-pay | Admitting: Internal Medicine

## 2020-08-16 DIAGNOSIS — R911 Solitary pulmonary nodule: Secondary | ICD-10-CM

## 2020-08-23 DIAGNOSIS — J381 Polyp of vocal cord and larynx: Secondary | ICD-10-CM | POA: Diagnosis not present

## 2020-08-23 DIAGNOSIS — R490 Dysphonia: Secondary | ICD-10-CM | POA: Diagnosis not present

## 2020-08-23 DIAGNOSIS — J385 Laryngeal spasm: Secondary | ICD-10-CM | POA: Diagnosis not present

## 2020-08-24 DIAGNOSIS — E785 Hyperlipidemia, unspecified: Secondary | ICD-10-CM | POA: Diagnosis not present

## 2020-08-25 DIAGNOSIS — H5319 Other subjective visual disturbances: Secondary | ICD-10-CM | POA: Diagnosis not present

## 2020-08-25 DIAGNOSIS — G43B0 Ophthalmoplegic migraine, not intractable: Secondary | ICD-10-CM | POA: Diagnosis not present

## 2020-08-25 DIAGNOSIS — H35033 Hypertensive retinopathy, bilateral: Secondary | ICD-10-CM | POA: Diagnosis not present

## 2020-08-30 DIAGNOSIS — R911 Solitary pulmonary nodule: Secondary | ICD-10-CM | POA: Diagnosis not present

## 2020-08-30 DIAGNOSIS — I10 Essential (primary) hypertension: Secondary | ICD-10-CM | POA: Diagnosis not present

## 2020-08-30 DIAGNOSIS — H538 Other visual disturbances: Secondary | ICD-10-CM | POA: Diagnosis not present

## 2020-08-30 DIAGNOSIS — E1169 Type 2 diabetes mellitus with other specified complication: Secondary | ICD-10-CM | POA: Diagnosis not present

## 2020-08-30 DIAGNOSIS — I77810 Thoracic aortic ectasia: Secondary | ICD-10-CM | POA: Diagnosis not present

## 2020-08-30 DIAGNOSIS — E785 Hyperlipidemia, unspecified: Secondary | ICD-10-CM | POA: Diagnosis not present

## 2020-08-30 DIAGNOSIS — I480 Paroxysmal atrial fibrillation: Secondary | ICD-10-CM | POA: Diagnosis not present

## 2020-08-30 DIAGNOSIS — K760 Fatty (change of) liver, not elsewhere classified: Secondary | ICD-10-CM | POA: Diagnosis not present

## 2020-08-30 DIAGNOSIS — R42 Dizziness and giddiness: Secondary | ICD-10-CM | POA: Diagnosis not present

## 2020-09-08 DIAGNOSIS — R490 Dysphonia: Secondary | ICD-10-CM | POA: Diagnosis not present

## 2020-09-24 DIAGNOSIS — Z03818 Encounter for observation for suspected exposure to other biological agents ruled out: Secondary | ICD-10-CM | POA: Diagnosis not present

## 2020-09-24 DIAGNOSIS — Z20822 Contact with and (suspected) exposure to covid-19: Secondary | ICD-10-CM | POA: Diagnosis not present

## 2020-09-24 DIAGNOSIS — R059 Cough, unspecified: Secondary | ICD-10-CM | POA: Diagnosis not present

## 2020-09-24 DIAGNOSIS — J069 Acute upper respiratory infection, unspecified: Secondary | ICD-10-CM | POA: Diagnosis not present

## 2020-10-14 DIAGNOSIS — R490 Dysphonia: Secondary | ICD-10-CM | POA: Diagnosis not present

## 2020-10-19 ENCOUNTER — Ambulatory Visit: Payer: PPO | Admitting: Neurology

## 2020-10-19 ENCOUNTER — Encounter: Payer: Self-pay | Admitting: Neurology

## 2020-10-19 VITALS — BP 152/83 | HR 69 | Ht 68.0 in | Wt 172.0 lb

## 2020-10-19 DIAGNOSIS — G43109 Migraine with aura, not intractable, without status migrainosus: Secondary | ICD-10-CM | POA: Insufficient documentation

## 2020-10-19 NOTE — Patient Instructions (Addendum)
Migraine with aura Consider magnesium   There is increased risk for stroke in women with migraine with aura and a contraindication for the combined contraceptive pill for use by women who have migraine with aura. The risk for women with migraine without aura is lower. However other risk factors like smoking are far more likely to increase stroke risk than migraine. There is a recommendation for no smoking and for the use of OCPs without estrogen such as progestogen only pills particularly for women with migraine with aura.Marland Kitchen People who have migraine headaches with auras may be 3 times more likely to have a stroke caused by a blood clot, compared to migraine patients who don't see auras. Women who take hormone-replacement therapy may be 30 percent more likely to suffer a clot-based stroke than women not taking medication containing estrogen. Other risk factors like smoking and high blood pressure may be  much more important.

## 2020-10-19 NOTE — Progress Notes (Signed)
[facility]    Provider:  Dr Jaynee Eagles Requesting Provider: Crist Infante, MD Primary Care Provider:  Crist Infante, MD  CC:  Headaches  10/19/2020: The occipital neuralgia is improved, occ she will have a rubber band feeling, brief. She is here for something new, she went to the eye doctor Dr. Santo Held everything was great. Twice now she starts to see wavy lines, can be both eyes, if she closes her eyes for 20 minutes she is fine, she has a history of occular migraines years ago after she ha her son and the quality is always the same she has had a handful over the years identical. She has had 2 in the last year, last was a month ago and she woke up and it was going on, wavy lines, greyish, it was both eyes. On the way home recently she could see it, zig zags, a grid, wavy like she is in a fishbowl. Always completely resolves. No residual symptoms. Lasts a few minutes. No pain at all, but has sensitivity to light.   HPI:  Amanda Frost is a 70 y.o. female here as requested by Amanda Infante, MD for headaches.  She has a past medical history of paroxysmal atrial tachycardia and fibrillation, ocular migraine, hypertension, liver cirrhosis secondary to [facility], on chronic anticoagulation, anxiety and panic attacks, remote ocular migraines, menopause age 46, heart murmur, osteoporosis November 2018. Headaches started this past spring April 2020  in the setting of computer work and this time it started again with increased computer use. It lasted 2 weeks last year and it was fine. The headaches came back 4 days ago. Tylenol helps in 45 minutes. Feels like dull ache in the right occipital, then throbbing 9not sharp, not shooting, not burning), moving neck to the right worsens it, she has soreness (point to the emergence of the occipital nerve), no light/sound sensitivity, no nausea or vomiting or other migrainous features. It happens when waking or can happen when working on the computer  all day, it can last at least 3-4 hours. The last was yesterday and the day before, she denies radiation to the eye or temple but her eyes do not feel tired.   Reviewed notes, labs and imaging from outside physicians, which showed:   I reviewed Dr. Tempie Donning notes.  Patient complaining of posterior headaches, eye pain on the right, sometimes the pain extends to the neck, she feels that she is being hit with a hammer, can last up to 45 minutes, sporadic, Tylenol, she feels a stabbing pain.  Headaches are intermittent every couple of days typically at night and then late morning several times a week.  Accompanied with neck tightness.  Typically right cervical pain is stabbing intermittent and residual aching.  Last 20 to 30 minutes.  Last bad headache was 2 days ago.  Multiple mild aching headaches today.  Worse with lateral neck movement.  Typically relieved with Tylenol.  History of ocular migraines without pain, last one was 3 years ago.  Denied fevers, chills, vision changes, neurologic deficits, chest pain, shortness of breath, nausea or vomiting.  Dr. Haynes Kerns suspected tension in the neck shoulder regions, prescribed cyclobenzaprine as needed for pain and spasm, Tylenol as needed acutely, alternate cold and heat for 20 minutes each day every 2 hours while awake, gave her neck stretches, and referred her to neurology.  I reviewed emergency room notes from February 2020.  Patient presented complaining of intermittent pain to the right side of head.  She  denied any other associated symptoms such as weakness.  No arm drift.  She reported headache which was resolved in the emergency room.  Acute onset of right sided posterior headache.  Pain was described as sharp.  Patient, being on Eliquis, was worried about up possible intracranial head bleed.  There were no other associated symptoms with her headache such as nausea, vomiting, visual change, weakness or other acute complaint.  It lasted for 3 hours.  It was  not similar to prior headaches this was new.  CT of the head was negative and since patient was asymptomatic at the time she was discharged home.  CT 05/2018 showed No acute intracranial abnormalities including mass lesion or mass effect, hydrocephalus, extra-axial fluid collection, midline shift, hemorrhage, or acute infarction, large ischemic events (personally reviewed images)    Review of Systems: Patient complains of symptoms per HPI as well as the following symptoms: visual aura . Pertinent negatives and positives per HPI. All others negative    Social History   Socioeconomic History   Marital status: Married    Spouse name: Not on file   Number of children: 4   Years of education: Not on file   Highest education level: Some college, no degree  Occupational History   Not on file  Tobacco Use   Smoking status: Never   Smokeless tobacco: Never  Vaping Use   Vaping Use: Never used  Substance and Sexual Activity   Alcohol use: No    Alcohol/week: 0.0 standard drinks   Drug use: No   Sexual activity: Not on file  Other Topics Concern   Not on file  Social History Narrative   Pt lives in [place] with spouse and 50 yr old granddaughter.  Retired Control and instrumentation engineer (kindergarten)   Right handed   Caffeine: tea, 1 cup/day   Social Determinants of Health   Financial Resource Strain: Not on file  Food Insecurity: Not on file  Transportation Needs: Not on file  Physical Activity: Not on file  Stress: Not on file  Social Connections: Not on file  Intimate Partner Violence: Not on file    Family History  Problem Relation Age of Onset   Diabetes Mother    Hypertension Mother    Kidney disease Mother    Diabetes Father    Hypertension Father    Migraines Maternal Grandmother    Migraines Son        childhood   Migraines Son        childhood    Past Medical History:  Diagnosis Date   Hypertension    Liver cirrhosis secondary to NASH (nonalcoholic  steatohepatitis) ([place])    Ocular migraine    Palpitations    PAC's,PVC's,atrial tachycardia   Paroxysmal atrial fibrillation [facility])     Patient Active Problem List   Diagnosis Date Noted   Migraine aura without headache 10/19/2020   Cervico-occipital neuralgia of right side 11/18/2018   Palpitations 10/15/2017   Chronic anticoagulation 12/05/2016   Paroxysmal atrial fibrillation ([place]) 01/21/2014   Liver cirrhosis secondary to NASH ([place]) 01/21/2014   Essential hypertension 10/29/2012   Paroxysmal atrial tachycardia ([place]) 10/29/2012    Past Surgical History:  Procedure Laterality Date   BREAST BIOPSY  1994   CARDIAC CATHETERIZATION  11/05/2007   normal LV fx,patent coronary arteries   NM MYOVIEW LTD  03/12/2005   normal   US ECHOCARDIOGRAPHY  11/26/2007   mild LVH,trace MR,TR    Current Outpatient Medications  Medication Sig Dispense Refill  adapalene (DIFFERIN) 0.1 % cream Apply 1 application topically daily.     apixaban (ELIQUIS) 5 MG TABS tablet Take 1 tablet (5 mg total) by mouth 2 (two) times daily. 180 tablet 1   Ascorbic Acid (VITAMIN C) 100 MG tablet Take 100 mg by mouth daily.     Cholecalciferol (VITAMIN D3) 5000 units CAPS Take 2 capsules by mouth every Monday, Tuesday, Wednesday, Thursday, and Friday.      fexofenadine (ALLEGRA) 180 MG tablet Take 180 mg by mouth daily as needed for allergies or rhinitis.     metoprolol succinate (TOPROL-XL) 50 MG 24 hr tablet TAKE 1 (50 MG) BY MOUTH EACH MORNING AND ONE (1/2) TABLET (25 MG) BY MOUTH EACH EVENING. 135 tablet 3   No current facility-administered medications for this visit.    Allergies as of 10/19/2020 - Review Complete 10/19/2020  Allergen Reaction Noted   Macrodantin [nitrofurantoin macrocrystal] Hives 02/12/2015   Ciprofloxacin Other (See Comments) 04/03/2016   Sulfa antibiotics Other (See Comments) and Hives 03/25/2012    Vitals: BP (!) 152/83   Pulse 69   Ht 5\' 8"  (1.727 m)   Wt 172 lb (78 kg)   BMI  26.15 kg/m  Last Weight:  Wt Readings from Last 1 Encounters:  10/19/20 172 lb (78 kg)   Last Height:   Ht Readings from Last 1 Encounters:  10/19/20 5\' 8"  (1.727 m)    Exam: NAD, pleasant                  Speech:    Speech is normal; fluent and spontaneous with normal comprehension.  Cognition:    The patient is oriented to person, place, and time;     recent and remote memory intact;     language fluent;    Cranial Nerves:    The pupils are equal, round, and reactive to light.Trigeminal sensation is intact and the muscles of mastication are normal. The face is symmetric. The palate elevates in the midline. Hearing intact. Voice is normal. Shoulder shrug is normal. The tongue has normal motion without fasciculations.   Coordination:  No dysmetria  Motor Observation:    No asymmetry, no atrophy, and no involuntary movements noted. Tone:    Normal muscle tone.     Strength:    Strength is V/V in the upper and lower limbs.      Sensation: intact to LT   Assessment/Plan:   Lovely 70 year old with occpital head pain on the right in the setting of more computer and phone use. Feels like dull ache in the right occipital area, moving neck to the right worsens it, she has soreness (point to the emergence of the occipital nerve), no light/sound sensitivity, no nausea or vomiting or other migrainous features. Likely Cervico-occipital neuralgia.   - occipital neuralgia improved - here for migraine aura, reassured her, magnesium may help prevent it but she only has it a few times a year. Ongoing for many years. Reassured patient.Not c/w strokes or TIA.  - discussed increased risk of stroke in women with migraines - she sometimes wakes with mild headaches, ESS only 6, I don;t think she has OSA, she sleeps on her back recommended sleeping on her side and weight loss.   PRIOR suggestions at last visit for occipital neuralgia:  - She did not take the flexeril yet, suggested trying it at  bedtime - She prefers no medications, will try Therapy and conservative measures - Dry needling for cervical myofascial pain -  [address] - Discussed good posture, her posture and working on the computer and phone is a trigger, or trying a soft cervical collar, ergonomic workspace - Occipital Nerve Blocks - consider in the future if needed - If above does not improve the headache, consider MRI cervical spine    No orders of the defined types were placed in this encounter.   Cc: Amanda Infante, MD,    Sarina Ill, MD  [facility] [address] [place], [place] [address]  Phone [phone] Fax [phone]  I spent 30 minutes of face-to-face and non-face-to-face time with patient on the  1. Migraine aura without headache    diagnosis.  This included previsit chart review, lab review, study review, order entry, electronic health record documentation, patient education on the different diagnostic and therapeutic options, counseling and coordination of care, risks and benefits of management, compliance, or risk factor reduction

## 2020-10-25 ENCOUNTER — Other Ambulatory Visit: Payer: Self-pay

## 2020-10-25 MED ORDER — APIXABAN 5 MG PO TABS
5.0000 mg | ORAL_TABLET | Freq: Two times a day (BID) | ORAL | 1 refills | Status: DC
Start: 2020-10-25 — End: 2021-11-06

## 2020-10-25 NOTE — Telephone Encounter (Signed)
Prescription refill request for Eliquis received. Indication:AFIB Last office visit:CROITORU 12/10/19 Scr:0.9 05/20/20 Age: 17F Weight:78KG

## 2020-11-09 ENCOUNTER — Encounter (HOSPITAL_BASED_OUTPATIENT_CLINIC_OR_DEPARTMENT_OTHER): Payer: Self-pay | Admitting: Obstetrics and Gynecology

## 2020-11-09 ENCOUNTER — Other Ambulatory Visit: Payer: Self-pay

## 2020-11-09 ENCOUNTER — Emergency Department (HOSPITAL_BASED_OUTPATIENT_CLINIC_OR_DEPARTMENT_OTHER)
Admission: EM | Admit: 2020-11-09 | Discharge: 2020-11-09 | Disposition: A | Discharge status: Home / Self Care | Payer: PPO | Attending: Emergency Medicine | Admitting: Emergency Medicine

## 2020-11-09 DIAGNOSIS — Z79899 Other long term (current) drug therapy: Secondary | ICD-10-CM | POA: Diagnosis not present

## 2020-11-09 DIAGNOSIS — Z7901 Long term (current) use of anticoagulants: Secondary | ICD-10-CM

## 2020-11-09 DIAGNOSIS — I10 Essential (primary) hypertension: Secondary | ICD-10-CM | POA: Diagnosis not present

## 2020-11-09 DIAGNOSIS — I480 Paroxysmal atrial fibrillation: Secondary | ICD-10-CM | POA: Diagnosis not present

## 2020-11-09 DIAGNOSIS — K921 Melena: Secondary | ICD-10-CM | POA: Diagnosis not present

## 2020-11-09 DIAGNOSIS — R195 Other fecal abnormalities: Secondary | ICD-10-CM

## 2020-11-09 LAB — COMPREHENSIVE METABOLIC PANEL
ALT: 65 U/L — ABNORMAL HIGH (ref 0–44)
AST: 59 U/L — ABNORMAL HIGH (ref 15–41)
Albumin: 4.6 g/dL (ref 3.5–5.0)
Alkaline Phosphatase: 118 U/L (ref 38–126)
Anion gap: 8 (ref 5–15)
BUN: 10 mg/dL (ref 8–23)
CO2: 28 mmol/L (ref 22–32)
Calcium: 9.9 mg/dL (ref 8.9–10.3)
Chloride: 104 mmol/L (ref 98–111)
Creatinine, Ser: 0.78 mg/dL (ref 0.44–1.00)
GFR, Estimated: >60 mL/min (ref >60)
Glucose, Bld: 142 mg/dL — ABNORMAL HIGH (ref 70–99)
Potassium: 4.2 mmol/L (ref 3.5–5.1)
Sodium: 140 mmol/L (ref 135–145)
Total Bilirubin: 1 mg/dL (ref 0.3–1.2)
Total Protein: 7.6 g/dL (ref 6.5–8.1)

## 2020-11-09 LAB — CBC
HCT: 40.8 % (ref 36.0–46.0)
Hemoglobin: 13.2 g/dL (ref 12.0–15.0)
MCH: 30 pg (ref 26.0–34.0)
MCHC: 32.4 g/dL (ref 30.0–36.0)
MCV: 92.7 fL (ref 80.0–100.0)
Platelets: 252 10*3/uL (ref 150–400)
RBC: 4.4 MIL/uL (ref 3.87–5.11)
RDW: 14.2 % (ref 11.5–15.5)
WBC: 6.9 10*3/uL (ref 4.0–10.5)
nRBC: 0 % (ref 0.0–0.2)

## 2020-11-09 LAB — OCCULT BLOOD X 1 CARD TO LAB, STOOL: Fecal Occult Bld: NEGATIVE

## 2020-11-09 NOTE — ED Provider Notes (Signed)
[facility] EMERGENCY DEPT Provider Note   CSN: [id number] Arrival date & time: 11/09/20  1528     History Chief Complaint  Patient presents with   Melena    Amanda Frost is a 71 y.o. female.  HPI Patient reports that she was at the beach last week.  She was feeling well.  Toward the end of the vacation, she had dark and tarry looking stool for a couple of days.  She did not have any associated abdominal pain, lightheadedness or other symptoms.  Patient reports since being home, for the past day now her stool has had normal appearance.  Patient is anticoagulated on Eliquis for paroxysmal atrial fibrillation.  She reports the last time that she was in atrial fibrillation was about 2 years ago.  She does see Dr. Cristina Frost for gastroenterology.  She made him aware of the symptoms and he counseled her to come to the emergency department for assessment.    Past Medical History:  Diagnosis Date   Hypertension    Liver cirrhosis secondary to NASH (nonalcoholic steatohepatitis) ([place])    Ocular migraine    Palpitations    PAC's,PVC's,atrial tachycardia   Paroxysmal atrial fibrillation [facility])     Patient Active Problem List   Diagnosis Date Noted   Migraine aura without headache 10/19/2020   Cervico-occipital neuralgia of right side 11/18/2018   Palpitations 10/15/2017   Chronic anticoagulation 12/05/2016   Paroxysmal atrial fibrillation ([place]) 01/21/2014   Liver cirrhosis secondary to NASH ([place]) 01/21/2014   Essential hypertension 10/29/2012   Paroxysmal atrial tachycardia ([place]) 10/29/2012    Past Surgical History:  Procedure Laterality Date   BREAST BIOPSY  1994   CARDIAC CATHETERIZATION  11/05/2007   normal LV fx,patent coronary arteries   NM MYOVIEW LTD  03/12/2005   normal   US ECHOCARDIOGRAPHY  11/26/2007   mild LVH,trace MR,TR     OB History     Gravida      Para      Term      Preterm      AB      Living  4      SAB      IAB       Ectopic      Multiple      Live Births              Family History  Problem Relation Age of Onset   Diabetes Mother    Hypertension Mother    Kidney disease Mother    Diabetes Father    Hypertension Father    Migraines Maternal Grandmother    Migraines Son        childhood   Migraines Son        childhood    Social History   Tobacco Use   Smoking status: Never   Smokeless tobacco: Never  Vaping Use   Vaping Use: Never used  Substance Use Topics   Alcohol use: No    Alcohol/week: 0.0 standard drinks   Drug use: No    Home Medications Prior to Admission medications   Medication Sig Start Date End Date Taking? Authorizing Provider  adapalene (DIFFERIN) 0.1 % cream Apply 1 application topically daily.    [provider]  apixaban (ELIQUIS) 5 MG TABS tablet Take 1 tablet (5 mg total) by mouth 2 (two) times daily. 10/25/20   Croitoru, Mihai, MD  Ascorbic Acid (VITAMIN C) 100 MG tablet Take 100 mg by mouth daily.  [provider]  Cholecalciferol (VITAMIN D3) 5000 units CAPS Take 2 capsules by mouth every Monday, Tuesday, Wednesday, Thursday, and Friday.     [provider]  fexofenadine (ALLEGRA) 180 MG tablet Take 180 mg by mouth daily as needed for allergies or rhinitis.    [provider]  metoprolol succinate (TOPROL-XL) 50 MG 24 hr tablet TAKE 1 (50 MG) BY MOUTH EACH MORNING AND ONE (1/2) TABLET (25 MG) BY MOUTH EACH EVENING. 05/27/20   [facility], Tami Lin, PA    Allergies    Macrodantin [nitrofurantoin macrocrystal], Ciprofloxacin, and Sulfa antibiotics  Review of Systems   Review of Systems 10 systems reviewed and negative except as per HPI Physical Exam Updated Vital Signs BP (!) 150/76   Pulse 69   Temp 98.5 F (36.9 C) (Oral)   Resp 18   Ht '5\' 8"'$  (1.727 m)   Wt 78.5 kg   SpO2 96%   BMI 26.30 kg/m   Physical Exam Constitutional:      Appearance: Normal appearance.  Cardiovascular:     Rate and Rhythm:  Normal rate and regular rhythm.  Pulmonary:     Effort: Pulmonary effort is normal.     Breath sounds: Normal breath sounds.  Abdominal:     General: There is no distension.     Palpations: Abdomen is soft.     Tenderness: There is no abdominal tenderness. There is no guarding.  Genitourinary:    Comments: Normal perianal appearance.  Trace amount of brown formed stool in the vault.  No melena. Musculoskeletal:        General: No swelling or tenderness.  Skin:    General: Skin is warm and dry.  Neurological:     General: No focal deficit present.     Mental Status: She is alert and oriented to person, place, and time.     Coordination: Coordination normal.  Psychiatric:        Mood and Affect: Mood normal.    ED Results / Procedures / Treatments   Labs (all labs ordered are listed, but only abnormal results are displayed) Labs Reviewed  COMPREHENSIVE METABOLIC PANEL - Abnormal; Notable for the following components:      Result Value   Glucose, Bld 142 (*)    AST 59 (*)    ALT 65 (*)    All other components within normal limits  CBC  POC OCCULT BLOOD, ED    EKG None  Radiology No results found.  Procedures Procedures   Medications Ordered in ED Medications - No data to display  ED Course  I have reviewed the triage vital signs and the nursing notes.  Pertinent labs & imaging results that were available during my care of the patient were reviewed by me and considered in my medical decision making (see chart for details).    MDM Rules/Calculators/A&P                           Patient is anticoagulated on Eliquis for paroxysmal atrial fibrillation.  She had several days of dark and melanotic looking stool at the end of last week.  She reports the last couple stools have now been normal.  Patient's blood counts are stable.  She is asymptomatic.  No orthostasis.  Stool is brown and normal in appearance without melena.  At this time, patient stable for continued  outpatient follow-up.  Recommendation to discuss the risks and benefits of ongoing treatment with  Eliquis with her cardiologist and gastroenterologist. Final Clinical Impression(s) / ED Diagnoses Final diagnoses:  Dark stools  Long term (current) use of anticoagulants    Rx / DC Orders ED Discharge Orders     None        Charlesetta Shanks, MD 11/09/20 2207

## 2020-11-09 NOTE — Discharge Instructions (Addendum)
1.  At this time there is no sign of active gastrointestinal bleeding.  Blood counts are stable and your stool is normal in color.  Bleeding can come and go sometimes.  Because you are taking Eliquis, it is very important that this is closely monitored.  Discuss continued use of Eliquis with your heart doctor and your gastroenterologist.  Return to the emergency department if you get a recurrence of dark stool, lightheadedness or other concerning symptoms.

## 2020-11-09 NOTE — ED Triage Notes (Signed)
Patient reports to the ER for tarry stools. Patient reports she is on eliquis so was encouraged to come to the ER for evaluation. Patient denies abdominal pain

## 2020-11-29 ENCOUNTER — Other Ambulatory Visit: Payer: PPO

## 2020-11-29 ENCOUNTER — Ambulatory Visit
Admission: RE | Admit: 2020-11-29 | Discharge: 2020-11-29 | Disposition: A | Discharge status: Home / Self Care | Payer: PPO | Source: Ambulatory Visit | Attending: Internal Medicine | Admitting: Internal Medicine

## 2020-11-29 ENCOUNTER — Other Ambulatory Visit: Payer: Self-pay

## 2020-11-29 DIAGNOSIS — R911 Solitary pulmonary nodule: Secondary | ICD-10-CM | POA: Diagnosis not present

## 2020-11-29 DIAGNOSIS — R918 Other nonspecific abnormal finding of lung field: Secondary | ICD-10-CM | POA: Diagnosis not present

## 2020-11-29 DIAGNOSIS — I700 Atherosclerosis of aorta: Secondary | ICD-10-CM | POA: Diagnosis not present

## 2020-12-27 DIAGNOSIS — R051 Acute cough: Secondary | ICD-10-CM | POA: Diagnosis not present

## 2020-12-27 DIAGNOSIS — R519 Headache, unspecified: Secondary | ICD-10-CM | POA: Diagnosis not present

## 2020-12-27 DIAGNOSIS — Z1152 Encounter for screening for COVID-19: Secondary | ICD-10-CM | POA: Diagnosis not present

## 2020-12-27 DIAGNOSIS — R5383 Other fatigue: Secondary | ICD-10-CM | POA: Diagnosis not present

## 2020-12-27 DIAGNOSIS — R509 Fever, unspecified: Secondary | ICD-10-CM | POA: Diagnosis not present

## 2020-12-27 DIAGNOSIS — J302 Other seasonal allergic rhinitis: Secondary | ICD-10-CM | POA: Diagnosis not present

## 2020-12-27 DIAGNOSIS — E1169 Type 2 diabetes mellitus with other specified complication: Secondary | ICD-10-CM | POA: Diagnosis not present

## 2020-12-27 DIAGNOSIS — I480 Paroxysmal atrial fibrillation: Secondary | ICD-10-CM | POA: Diagnosis not present

## 2020-12-27 DIAGNOSIS — J029 Acute pharyngitis, unspecified: Secondary | ICD-10-CM | POA: Diagnosis not present

## 2020-12-27 DIAGNOSIS — U071 COVID-19: Secondary | ICD-10-CM | POA: Diagnosis not present

## 2020-12-29 ENCOUNTER — Ambulatory Visit: Payer: PPO | Admitting: Neurology

## 2021-01-31 ENCOUNTER — Ambulatory Visit (INDEPENDENT_AMBULATORY_CARE_PROVIDER_SITE_OTHER): Payer: PPO | Admitting: Cardiovascular Disease

## 2021-01-31 ENCOUNTER — Encounter: Payer: Self-pay | Admitting: Cardiovascular Disease

## 2021-01-31 ENCOUNTER — Other Ambulatory Visit: Payer: Self-pay

## 2021-01-31 VITALS — BP 146/90 | HR 77 | Ht 68.0 in | Wt 174.0 lb

## 2021-01-31 DIAGNOSIS — I10 Essential (primary) hypertension: Secondary | ICD-10-CM

## 2021-01-31 DIAGNOSIS — Z7901 Long term (current) use of anticoagulants: Secondary | ICD-10-CM

## 2021-01-31 DIAGNOSIS — I480 Paroxysmal atrial fibrillation: Secondary | ICD-10-CM | POA: Diagnosis not present

## 2021-01-31 NOTE — Patient Instructions (Signed)

## 2021-01-31 NOTE — Progress Notes (Signed)
Patient ID: Amanda Frost, female   DOB: [birth year], 70 y.o.   MRN: [id number]     Cardiology Office Note    Date:  01/31/2021   ID:  Amanda Frost, DOB 01-May-1950, MRN [id number]  PCP:  Amanda Infante, MD  Cardiologist:   Amanda Klein, MD   Chief Complaint  Patient presents with   Follow-up    13 months.    History of Present Illness:  Amanda Frost is a 70 y.o. 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   From a cardiac point of view she has had a good year.  She has not had any sustained episodes of arrhythmia.  She has occasional palpitations, especially when she is excited or is under stress.  These do not associate dyspnea, angina or presyncope.  She was seen in the emergency room in August for dark stools, but her hemoglobin was normal and the Hemoccult testing was also negative.  She underwent a follow-up CT angiogram of her mildly dilated ascending aorta which measures only 3.9 cm in maximal diameter.  She has mild aortic atherosclerosis, incidentally noted.  She is compliant with Eliquis anticoagulation has not had any bleeding events, stroke, TIA.  She has had a total of 4 episodes of symptomatic A. fib since the initial diagnosis in 2016.  She had cardioversion in August 2019 for symptomatic rapid atrial fibrillation.  On another occasion she received flecainide, with conversion to normal rhythm occurring almost 4 hours after its administration making it unclear whether it was spontaneous conversion or chemically induced cardioversion.  Since then she has had infrequent palpitation events usually very brief (3-4 times a week lasting for a few seconds once every month or so lasting for about an hour).  On May 25 she was awoken from sleep with rapid atrial fibrillation and again had  cardioversion in the emergency room.  In July 2021 she had normal sinus rhythm, rare episodes of brief nonsustained atrial tachycardia and rare episodes of very brief nonsustained VT (maximum 5 beats).  The symptomatic recordings all correlated with isolated PACs and PVCs.  She did not have any bradycardia problems.  She saw Amanda Frost in July 2021 to discuss additional treatment for atrial fibrillation.  He recommended treatment with flecainide 50 mg.  She was worried about side effects and she decided not to take it.  She continues to take metoprolol.  Ablation was discussed but both Amanda Frost and the patient agreed that her spells are too infrequent to justify aggressive intervention at this time.  The patient specifically denies any chest pain at rest exertion, dyspnea at rest or with exertion, orthopnea, paroxysmal nocturnal dyspnea, syncope,  focal neurological deficits, intermittent claudication, lower extremity edema, unexplained weight gain, cough, hemoptysis or wheezing.  She is under quite a bit of emotional stress.  Amanda Frost are raising their granddaughter Amanda Frost, since their son is having to deal with problems related to substance abuse.  They have not been communicating with their son and actually have a restraining order out, since he is verbally abusive and threatening.   Past Medical History:  Diagnosis Date   Hypertension    Liver cirrhosis secondary to NASH (nonalcoholic steatohepatitis) (HCC)    Ocular migraine    Palpitations    PAC's,PVC's,atrial tachycardia   Paroxysmal atrial fibrillation [facility])     Past Surgical History:  Procedure Laterality Date   BREAST BIOPSY  1994   CARDIAC CATHETERIZATION  11/05/2007   normal LV fx,patent coronary arteries   NM MYOVIEW LTD  03/12/2005   normal   US ECHOCARDIOGRAPHY  11/26/2007   mild LVH,trace MR,TR    Outpatient Medications Prior to Visit  Medication Sig Dispense Refill   adapalene (DIFFERIN) 0.1 % cream  Apply 1 application topically daily.     apixaban (ELIQUIS) 5 MG TABS tablet Take 1 tablet (5 mg total) by mouth 2 (two) times daily. 180 tablet 1   Ascorbic Acid (VITAMIN C) 100 MG tablet Take 100 mg by mouth daily.     Cholecalciferol (VITAMIN D3) 5000 units CAPS Take 2 capsules by mouth every Monday, Tuesday, Wednesday, Thursday, and Friday.      fexofenadine (ALLEGRA) 180 MG tablet Take 180 mg by mouth daily as needed for allergies or rhinitis.     metoprolol succinate (TOPROL-XL) 50 MG 24 hr tablet TAKE 1 (50 MG) BY MOUTH EACH MORNING AND ONE (1/2) TABLET (25 MG) BY MOUTH EACH EVENING. 135 tablet 3   No facility-administered medications prior to visit.     Allergies:   Macrodantin [nitrofurantoin macrocrystal], Ciprofloxacin, and Sulfa antibiotics   Social History   Socioeconomic History   Marital status: Married    Spouse name: Not on file   Number of children: 4   Years of education: Not on file   Highest education level: Some college, no degree  Occupational History   Not on file  Tobacco Use   Smoking status: Never   Smokeless tobacco: Never  Vaping Use   Vaping Use: Never used  Substance and Sexual Activity   Alcohol use: No    Alcohol/week: 0.0 standard drinks   Drug use: No   Sexual activity: Yes  Other Topics Concern   Not on file  Social History Narrative   Pt lives in [place] with spouse and 48 yr old granddaughter.  Retired Control and instrumentation engineer (kindergarten)   Right handed   Caffeine: tea, 1 cup/day   Social Determinants of Radio broadcast assistant Strain: Not on file  Food Insecurity: Not on file  Transportation Needs: Not on file  Physical Activity: Not on file  Stress: Not on file  Social Connections: Not on file     Family History:  The patient's family history includes Diabetes in her father and mother; Hypertension in her father and mother; Kidney disease in her mother; Migraines in her maternal grandmother, son, and son.   ROS:   Please  see the history of present illness.    ROS All other systems are reviewed and are negative.   PHYSICAL EXAM:   VS:  BP (!) 146/90 (BP Location: Left Arm, Patient Position: Sitting, Cuff Size: Normal)   Pulse 77   Ht 5\' 8"  (1.727 m)   Wt 174 lb (78.9 kg)   BMI 26.46 kg/m      General: Alert, oriented x3, no distress Head: no evidence of trauma, PERRL, EOMI, no exophtalmos or lid lag, no myxedema, no xanthelasma; normal ears, nose and oropharynx Neck: normal jugular venous pulsations and no hepatojugular reflux; brisk carotid pulses without delay and no carotid bruits Chest: clear to auscultation, no signs of consolidation by percussion or palpation, normal fremitus, symmetrical and full respiratory excursions Cardiovascular: normal position and quality of the apical impulse, regular rhythm, normal first and second heart sounds, no murmurs, rubs or gallops Abdomen: no tenderness or distention, no masses by palpation, no  abnormal pulsatility or arterial bruits, normal bowel sounds, no hepatosplenomegaly Extremities: no clubbing, cyanosis or edema; 2+ radial, ulnar and brachial pulses bilaterally; 2+ right femoral, posterior tibial and dorsalis pedis pulses; 2+ left femoral, posterior tibial and dorsalis pedis pulses; no subclavian or femoral bruits Neurological: grossly nonfocal Psych: Normal mood and affect   Wt Readings from Last 3 Encounters:  01/31/21 174 lb (78.9 kg)  11/09/20 173 lb (78.5 kg)  10/19/20 172 lb (78 kg)      Studies/Labs Reviewed:   EKG:  EKG ordered today shows normal sinus rhythm, poor R wave progression probably due to V2-V3 lead misplacement, otherwise normal tracing  Recent Labs: 11/09/2020: ALT 65; BUN 10; Creatinine, Ser 0.78; Hemoglobin 13.2; Platelets 252; Potassium 4.2; Sodium 140   Lipid Panel    Component Value Date/Time   CHOL  11/05/2007 0821    136        ATP III CLASSIFICATION:  <200     mg/dL   Desirable  200-239  mg/dL   Borderline High   >=240    mg/dL   High   TRIG 145 11/05/2007 0821   HDL 36 (L) 11/05/2007 0821   CHOLHDL 3.8 11/05/2007 0821   VLDL 29 11/05/2007 0821   LDLCALC  11/05/2007 0821    71        Total Cholesterol/HDL:CHD Risk Coronary Heart Disease Risk Table                     Men   Women  1/2 Average Risk   3.4   3.3     ASSESSMENT:    1. Paroxysmal atrial fibrillation (HCC)   2. Essential hypertension   3. Long term current use of anticoagulant      PLAN:  In order of problems listed above:  1. AFib: Continues to have very infrequent events, even though she is not receiving antiarrhythmic therapy.  Symptoms are generally limited to very unpleasant palpitations and racing heartbeat.  Although her atrial fibrillation spells are symptomatic, they are quite infrequent (4 times in the 5 years).   She has been compliant with direct oral anticoagulant therapy. CHADSVasc 3  (age, gender, HTN).  Reviewed the management of single episodes of atrial fibrillation by relaxing, taking an extra dose of beta-blocker and waiting for a few hours.  However she should seek urgent medical attention if she has angina, severe dyspnea or presyncope/syncope.  We have options for ablation or antiarrhythmic therapy if the frequency range.  2. HTN: Her blood pressure is usually little high when she first checks in, but again today after relaxing her blood pressure decreased to 136/84.  3. Anticoagulation: compliant, no serious bleeding.  Stroke/TIA risk is relatively low.  Medication Adjustments/Labs and Tests Ordered: Current medicines are reviewed at length with the patient today.  Concerns regarding medicines are outlined above.  Medication changes, Labs and Tests ordered today are listed in the Patient Instructions below. Patient Instructions  Medication Instructions:  No changes *If you need a refill on your cardiac medications before your next appointment, please call your pharmacy*   Lab Work: None ordered If you  have labs (blood work) drawn today and your tests are completely normal, you will receive your results only by: [place] (if you have MyChart) OR A paper copy in the mail If you have any lab test that is abnormal or we need to change your treatment, we will call you to review the results.   Testing/Procedures: None  ordered   Follow-Up: At [facility], you and your health needs are our priority.  As part of our continuing mission to provide you with exceptional heart care, we have created designated Provider Care Teams.  These Care Teams include your primary Cardiologist (physician) and Advanced Practice Providers (APPs -  Physician Assistants and Nurse Practitioners) who all work together to provide you with the care you need, when you need it.  We recommend signing up for the patient portal called "MyChart".  Sign up information is provided on this After Visit Summary.  MyChart is used to connect with patients for Virtual Visits (Telemedicine).  Patients are able to view lab/test results, encounter notes, upcoming appointments, etc.  Non-urgent messages can be sent to your provider as well.   To learn more about what you can do with MyChart, go to [email or web address].    Your next appointment:   12 month(s)  The format for your next appointment:   In Person  Provider:   You may see Amanda Klein, MD or one of the following Advanced Practice Providers on your designated Care Team:   Almyra Deforest, PA-C Fabian Sharp, [place] or  Roby Lofts, PA-C       Signed, Amanda Klein, MD  01/31/2021 1:51 PM    [facility] [address] [place], [place], [place]  [address] Phone: [phone]; Fax: [phone]

## 2021-02-22 ENCOUNTER — Emergency Department (HOSPITAL_COMMUNITY)
Admission: EM | Admit: 2021-02-22 | Discharge: 2021-02-22 | Disposition: A | Discharge status: Home / Self Care | Payer: PPO | Attending: Emergency Medicine | Admitting: Emergency Medicine

## 2021-02-22 ENCOUNTER — Telehealth: Payer: Self-pay | Admitting: Internal Medicine

## 2021-02-22 ENCOUNTER — Telehealth: Payer: Self-pay | Admitting: Cardiovascular Disease

## 2021-02-22 ENCOUNTER — Other Ambulatory Visit: Payer: Self-pay

## 2021-02-22 DIAGNOSIS — I4891 Unspecified atrial fibrillation: Secondary | ICD-10-CM | POA: Diagnosis not present

## 2021-02-22 DIAGNOSIS — I10 Essential (primary) hypertension: Secondary | ICD-10-CM | POA: Insufficient documentation

## 2021-02-22 DIAGNOSIS — R002 Palpitations: Secondary | ICD-10-CM | POA: Diagnosis present

## 2021-02-22 LAB — CBC WITH DIFFERENTIAL/PLATELET
Abs Immature Granulocytes: 0.03 10*3/uL (ref 0.00–0.07)
Basophils Absolute: 0.1 10*3/uL (ref 0.0–0.1)
Basophils Relative: 1 %
Eosinophils Absolute: 0.2 10*3/uL (ref 0.0–0.5)
Eosinophils Relative: 3 %
HCT: 41.2 % (ref 36.0–46.0)
Hemoglobin: 13.5 g/dL (ref 12.0–15.0)
Immature Granulocytes: 0 %
Lymphocytes Relative: 43 %
Lymphs Abs: 3.8 10*3/uL (ref 0.7–4.0)
MCH: 30.5 pg (ref 26.0–34.0)
MCHC: 32.8 g/dL (ref 30.0–36.0)
MCV: 93 fL (ref 80.0–100.0)
Monocytes Absolute: 1 10*3/uL (ref 0.1–1.0)
Monocytes Relative: 11 %
Neutro Abs: 3.7 10*3/uL (ref 1.7–7.7)
Neutrophils Relative %: 42 %
Platelets: 254 10*3/uL (ref 150–400)
RBC: 4.43 MIL/uL (ref 3.87–5.11)
RDW: 13.9 % (ref 11.5–15.5)
WBC: 8.9 10*3/uL (ref 4.0–10.5)
nRBC: 0 % (ref 0.0–0.2)

## 2021-02-22 LAB — COMPREHENSIVE METABOLIC PANEL
ALT: 101 U/L — ABNORMAL HIGH (ref 0–44)
AST: 85 U/L — ABNORMAL HIGH (ref 15–41)
Albumin: 4.2 g/dL (ref 3.5–5.0)
Alkaline Phosphatase: 135 U/L — ABNORMAL HIGH (ref 38–126)
Anion gap: 8 (ref 5–15)
BUN: 9 mg/dL (ref 8–23)
CO2: 26 mmol/L (ref 22–32)
Calcium: 9.9 mg/dL (ref 8.9–10.3)
Chloride: 103 mmol/L (ref 98–111)
Creatinine, Ser: 0.81 mg/dL (ref 0.44–1.00)
GFR, Estimated: >60 mL/min (ref >60)
Glucose, Bld: 159 mg/dL — ABNORMAL HIGH (ref 70–99)
Potassium: 3.4 mmol/L — ABNORMAL LOW (ref 3.5–5.1)
Sodium: 137 mmol/L (ref 135–145)
Total Bilirubin: 1 mg/dL (ref 0.3–1.2)
Total Protein: 7 g/dL (ref 6.5–8.1)

## 2021-02-22 LAB — MAGNESIUM: Magnesium: 1.9 mg/dL (ref 1.7–2.4)

## 2021-02-22 LAB — TSH: TSH: 9.468 u[IU]/mL — ABNORMAL HIGH (ref 0.350–4.500)

## 2021-02-22 MED ORDER — ETOMIDATE 2 MG/ML IV SOLN
8.0000 mg | Freq: Once | INTRAVENOUS | Status: AC
  Administered 2021-02-22: 8 mg via INTRAVENOUS
  Filled 2021-02-22: qty 10

## 2021-02-22 MED ORDER — METOPROLOL TARTRATE 5 MG/5ML IV SOLN
5.0000 mg | Freq: Once | INTRAVENOUS | Status: AC
  Administered 2021-02-22: 5 mg via INTRAVENOUS
  Filled 2021-02-22: qty 5

## 2021-02-22 NOTE — Telephone Encounter (Signed)
Spoke with pt, she does feel like she is still in rhythm, she is just tired. Follow up scheduled and she will let us know if anything changes.

## 2021-02-22 NOTE — Discharge Instructions (Signed)
You were seen in the emergency room today with atrial fibrillation.  We were able to cardiovert you back into normal rhythm.  Please continue your medications including your metoprolol as well as your blood thinner.  I would like for you to call your cardiology team later this morning to make them aware of your ED visit and plan a follow-up appointment.  If your symptoms return, please return to the emergency department or call 911.

## 2021-02-22 NOTE — ED Provider Notes (Signed)
Emergency Department Provider Note   I have reviewed the triage vital signs and the nursing notes.   HISTORY  Chief Complaint Tachycardia   HPI Amanda Frost is a 70 y.o. female with past history reviewed below including PAF on Eliquis followed by Dr. Sallyanne Kuster presents to the emergency department with heart palpitations.  The symptoms woke her from sleep.  She denies any pain or pressure in the chest but notes that her heart rate was elevated and feels like her A. fib.  She is strictly compliant with her anticoagulation.  She called and spoke with the cardiologist on-call and took additional metoprolol.  Heart rate did not improve and so was advised to present to the emergency department.  Patient tells me that she has had 2 prior cardioversions in the past.  She has continued her metoprolol as prescribed.  There has been some discussion regarding flecainide but at the time deemed to not move forward with this or an ablation as symptomatic episodes were spaced far apart. She denies any change to medication or routine. Last PO was 4 hours prior. No complications with cardioversion in the past.    Past Medical History:  Diagnosis Date   Hypertension    Liver cirrhosis secondary to NASH (nonalcoholic steatohepatitis) (HCC)    Ocular migraine    Palpitations    PAC's,PVC's,atrial tachycardia   Paroxysmal atrial fibrillation [facility])     Patient Active Problem List   Diagnosis Date Noted   Migraine aura without headache 10/19/2020   Cervico-occipital neuralgia of right side 11/18/2018   Palpitations 10/15/2017   Chronic anticoagulation 12/05/2016   Paroxysmal atrial fibrillation ([place]) 01/21/2014   Liver cirrhosis secondary to NASH ([place]) 01/21/2014   Essential hypertension 10/29/2012   Paroxysmal atrial tachycardia ([place]) 10/29/2012    Past Surgical History:  Procedure Laterality Date   BREAST BIOPSY  1994   CARDIAC CATHETERIZATION  11/05/2007   normal LV fx,patent coronary  arteries   NM MYOVIEW LTD  03/12/2005   normal   US ECHOCARDIOGRAPHY  11/26/2007   mild LVH,trace MR,TR    Allergies Macrodantin [nitrofurantoin macrocrystal], Ciprofloxacin, and Sulfa antibiotics  Family History  Problem Relation Age of Onset   Diabetes Mother    Hypertension Mother    Kidney disease Mother    Diabetes Father    Hypertension Father    Migraines Maternal Grandmother    Migraines Son        childhood   Migraines Son        childhood    Social History Social History   Tobacco Use   Smoking status: Never   Smokeless tobacco: Never  Vaping Use   Vaping Use: Never used  Substance Use Topics   Alcohol use: No    Alcohol/week: 0.0 standard drinks   Drug use: No    Review of Systems  Constitutional: No fever/chills Eyes: No visual changes. ENT: No sore throat.  Cardiovascular: Denies chest pain. Positive palpitations.  Respiratory: Denies shortness of breath. Gastrointestinal: No abdominal pain.  No nausea, no vomiting.  No diarrhea.  No constipation. Genitourinary: Negative for dysuria. Musculoskeletal: Negative for back pain. Skin: Negative for rash. Neurological: Negative for headaches, focal weakness or numbness.  10-point ROS otherwise negative.  ____________________________________________   PHYSICAL EXAM:  VITAL SIGNS: ED Triage Vitals [02/22/21 0300]  Enc Vitals Group     BP (!) 165/130     Pulse Rate (!) 166     Resp 17     Temp 98.8  F (37.1 C)     Temp Source Oral     SpO2 98 %     Weight 175 lb (79.4 kg)     Height 5\' 8"  (1.727 m)   Constitutional: Alert and oriented. Well appearing and in no acute distress. Eyes: Conjunctivae are normal.  Head: Atraumatic. Nose: No congestion/rhinnorhea. Mouth/Throat: Mucous membranes are moist.   Neck: No stridor.   Cardiovascular: A-fib with RVR. Good peripheral circulation. Grossly normal heart sounds.   Respiratory: Normal respiratory effort.  No retractions. Lungs  CTAB. Gastrointestinal: Soft and nontender. No distention.  Musculoskeletal: No lower extremity tenderness nor edema. No gross deformities of extremities. Neurologic:  Normal speech and language. No gross focal neurologic deficits are appreciated.  Skin:  Skin is warm, dry and intact. No rash noted.  ____________________________________________   LABS (all labs ordered are listed, but only abnormal results are displayed)  Labs Reviewed  COMPREHENSIVE METABOLIC PANEL - Abnormal; Notable for the following components:      Result Value   Potassium 3.4 (*)    Glucose, Bld 159 (*)    AST 85 (*)    ALT 101 (*)    Alkaline Phosphatase 135 (*)    All other components within normal limits  TSH - Abnormal; Notable for the following components:   TSH 9.468 (*)    All other components within normal limits  CBC WITH DIFFERENTIAL/PLATELET  MAGNESIUM   ____________________________________________  EKG   EKG Interpretation  Date/Time:  Wednesday February 22 2021 03:18:51 EST Ventricular Rate:  150 PR Interval:    QRS Duration: 81 QT Interval:  287 QTC Calculation: 454 R Axis:   59 Text Interpretation: Atrial fibrillation Repolarization abnormality, prob rate related Confirmed by Nanda Quinton [phone]) on 02/22/2021 3:29:18 AM       Post cardioversion:   EKG Interpretation  Date/Time:  Wednesday February 22 2021 05:01:18 EST Ventricular Rate:  88 PR Interval:  162 QRS Duration: 98 QT Interval:  368 QTC Calculation: 446 R Axis:   24 Text Interpretation: Sinus rhythm Consider left atrial enlargement Abnormal R-wave progression, early transition Artifact in lead(s) I II aVR aVL Confirmed by Nanda Quinton [phone]) on 02/22/2021 5:10:14 AM        ____________________________________________  RADIOLOGY  None   ____________________________________________   PROCEDURES  Procedure(s) performed:   .Cardioversion  Date/Time: 02/22/2021 5:02 AM Performed by: Margette Fast,  MD Authorized by: Margette Fast, MD   Consent:    Consent obtained:  Written   Consent given by:  Patient   Risks discussed:  Cutaneous burn, death, induced arrhythmia and pain   Alternatives discussed:  Rate-control medication Universal protocol:    Immediately prior to procedure a time out was called: yes     Patient identity confirmed:  Verbally with patient and arm band Pre-procedure details:    Cardioversion basis:  Elective   Rhythm:  Atrial fibrillation   Electrode placement:  Anterior-posterior Patient sedated: Yes. Refer to sedation procedure documentation for details of sedation.  Attempt one:    Cardioversion mode:  Synchronous   Shock (Joules):  100   Shock outcome:  Conversion to normal sinus rhythm Post-procedure details:    Patient status:  Awake   Patient tolerance of procedure:  Tolerated well, no immediate complications .Sedation  Date/Time: 02/22/2021 5:03 AM Performed by: Margette Fast, MD Authorized by: Margette Fast, MD   Consent:    Consent obtained:  Written   Consent given by:  Patient  Risks discussed:  Allergic reaction, dysrhythmia, inadequate sedation, nausea, vomiting, respiratory compromise necessitating ventilatory assistance and intubation, prolonged sedation necessitating reversal and prolonged hypoxia resulting in organ damage Universal protocol:    Immediately prior to procedure, a time out was called: yes     Patient identity confirmed:  Verbally with patient and arm band Indications:    Procedure performed:  Cardioversion Pre-sedation assessment:    Time since last food or drink:  4.5   ASA classification: class 2 - patient with mild systemic disease     Mallampati score:  II - soft palate, uvula, fauces visible   Pre-sedation assessments completed and reviewed: airway patency, cardiovascular function, hydration status, mental status, nausea/vomiting, pain level, respiratory function and temperature   Immediate pre-procedure  details:    Reassessment: Patient reassessed immediately prior to procedure     Reviewed: vital signs, relevant labs/tests and NPO status     Verified: bag valve mask available, emergency equipment available, intubation equipment available, IV patency confirmed, oxygen available and suction available   Procedure details (see MAR for exact dosages):    Preoxygenation:  Nasal cannula   Sedation:  Etomidate   Intended level of sedation: deep   Analgesia:  None   Intra-procedure monitoring:  Blood pressure monitoring, cardiac monitor, continuous pulse oximetry, continuous capnometry, frequent LOC assessments and frequent vital sign checks   Intra-procedure events comment:  O2 sat briefly dropped to 90% but not lower. No apnea.   Intra-procedure management:  Airway repositioning   Total Provider sedation time (minutes):  30 Post-procedure details:    Attendance: Constant attendance by certified staff until patient recovered     Recovery: Patient returned to pre-procedure baseline     Post-sedation assessments completed and reviewed: airway patency, cardiovascular function, hydration status, mental status, nausea/vomiting, pain level and respiratory function     Patient is stable for discharge or admission: no     Procedure completion:  Tolerated well, no immediate complications   ____________________________________________   INITIAL IMPRESSION / ASSESSMENT AND PLAN / ED COURSE  Pertinent labs & imaging results that were available during my care of the patient were reviewed by me and considered in my medical decision making (see chart for details).   Patient presents emergency department with symptomatic A. fib.  She is anticoagulated and compliant with this medication.  Heart rate on arrival in the 150s to 160s with elevated blood pressure.  Mentation is normal.  We will discuss the patient's candidacy for cardioversion with the cardiology team on-call, who spoke with patient earlier this  evening.  She has had multiple cardioversions in the past without issue. Will send labs to evaluate electrolytes and give IV metoprolol pending those results.   Spoke with Dr. Vickki Muff with Cardiology.  He agrees with plan for cardioversion in the emergency department.  Patient does not have episodes frequently of breakthrough A. fib.  This may buy some time to consider ablation or other pharmacologic treatment as an outpatient.   Lab work reviewed.  Potassium 3.4.  Magnesium normal.  TSH is elevated at 9.4 but patient's presentation is not consistent with hypothyroid.  LFTs mildly elevated but no abdominal discomfort or tenderness.   05:00 AM  Patient cardioverted in the emergency department without complication with either sedation or with cardioversion.  Sinus rhythm on repeat EKG.  No acute ischemic change.  06:25 AM  Patient feeling well. No return of a-fib. She is ambulatory here and tolerating PO fluids. Plan for discharge with plan to  call her Cardiology team today and schedule a follow up appointment to review her ED visit.  ____________________________________________  FINAL CLINICAL IMPRESSION(S) / ED DIAGNOSES  Final diagnoses:  Atrial fibrillation with RVR ([place])    MEDICATIONS GIVEN DURING THIS VISIT:  Medications  metoprolol tartrate (LOPRESSOR) injection 5 mg (5 mg Intravenous Given 02/22/21 0335)  etomidate (AMIDATE) injection 8 mg (8 mg Intravenous Given 02/22/21 0456)    Note:  This document was prepared using Dragon voice recognition software and may include unintentional dictation errors.  Nanda Quinton, MD, [facility] Emergency Medicine    Breckon Reeves, [facility], MD 02/22/21 [phone]

## 2021-02-22 NOTE — Telephone Encounter (Signed)
Patient called stating that her HR was elevated. She has a history of paroxysmal AF and reports that she woke up with tachycardia. She checked her HR manually over 10 seconds and found that her HR was 120 beats per min. She then took a half of her metoprolol and called for recommendations. I instructed her to check her BP with her home monitor, but it kept reading error and eventually read 170/160 with a HR of 152. She reports that she is feeling anxious and having some chest tightness. I recommended that she come to the ED for more urgent evaluation rather than trying to triage and manage this over the phone.

## 2021-02-22 NOTE — Telephone Encounter (Signed)
Patient was recently in the ED for A-FIB, they advised her to follow up with Dr. Sallyanne Kuster however I wasn't able to find anything anytime soon. Patient would like to be seen soon.

## 2021-02-22 NOTE — ED Triage Notes (Signed)
Pt arrived POV with c/c of rapid heart rate. Pt states she wake woken from heart racing in [place]. Pt took 25mg  of metoprolol after speaking with her PCP and was told to come to the emergency department.Pt has hx of A-Fib, with 2 cardioversion.

## 2021-02-22 NOTE — ED Notes (Signed)
Pt ambulated in hall with steady gait, no complaints of chest pain, shOB, or dizziness. Vitals remained WNL. 75-89 HR, 99%RA

## 2021-02-27 ENCOUNTER — Other Ambulatory Visit: Payer: Self-pay | Admitting: Gastroenterology

## 2021-02-27 DIAGNOSIS — I480 Paroxysmal atrial fibrillation: Secondary | ICD-10-CM | POA: Diagnosis not present

## 2021-02-27 DIAGNOSIS — R1030 Lower abdominal pain, unspecified: Secondary | ICD-10-CM

## 2021-02-27 DIAGNOSIS — K625 Hemorrhage of anus and rectum: Secondary | ICD-10-CM | POA: Diagnosis not present

## 2021-02-27 DIAGNOSIS — R7989 Other specified abnormal findings of blood chemistry: Secondary | ICD-10-CM | POA: Diagnosis not present

## 2021-02-27 DIAGNOSIS — R030 Elevated blood-pressure reading, without diagnosis of hypertension: Secondary | ICD-10-CM | POA: Diagnosis not present

## 2021-02-28 ENCOUNTER — Telehealth: Payer: Self-pay | Admitting: Cardiovascular Disease

## 2021-02-28 ENCOUNTER — Encounter: Payer: Self-pay | Admitting: Cardiovascular Disease

## 2021-02-28 NOTE — Telephone Encounter (Signed)
Pt wants to know if it's okay for her to drink the contrast for the upcoming CT Scan

## 2021-02-28 NOTE — Telephone Encounter (Signed)
Called pt to clarify question. Pt. States she has been having intestinal issues and is scheduled to have a CT of the abdomen. Pt. States she was confused because the prescribing doctor called it contrast.... according to description it seems that substance given was a bowel prep not contrast. Patient is just concerned because of her history of A. Fib. Assured pt. That she was probably okay but would forward her concerns to the MD.    Pt. Is going to send a mychart message with actual name of the substance given to her as prep for MD to review.

## 2021-03-01 NOTE — Telephone Encounter (Signed)
See MyChart message-duplicate

## 2021-03-03 ENCOUNTER — Other Ambulatory Visit: Payer: Self-pay

## 2021-03-03 ENCOUNTER — Ambulatory Visit
Admission: RE | Admit: 2021-03-03 | Discharge: 2021-03-03 | Disposition: A | Discharge status: Home / Self Care | Payer: PPO | Source: Ambulatory Visit | Attending: Gastroenterology | Admitting: Gastroenterology

## 2021-03-03 DIAGNOSIS — N83201 Unspecified ovarian cyst, right side: Secondary | ICD-10-CM | POA: Diagnosis not present

## 2021-03-03 DIAGNOSIS — R1030 Lower abdominal pain, unspecified: Secondary | ICD-10-CM

## 2021-03-03 DIAGNOSIS — N83202 Unspecified ovarian cyst, left side: Secondary | ICD-10-CM | POA: Diagnosis not present

## 2021-03-03 DIAGNOSIS — K760 Fatty (change of) liver, not elsewhere classified: Secondary | ICD-10-CM | POA: Diagnosis not present

## 2021-03-03 MED ORDER — IOPAMIDOL (ISOVUE-300) INJECTION 61%
100.0000 mL | Freq: Once | INTRAVENOUS | Status: AC | PRN
  Administered 2021-03-03: 100 mL via INTRAVENOUS

## 2021-03-13 DIAGNOSIS — N83201 Unspecified ovarian cyst, right side: Secondary | ICD-10-CM | POA: Diagnosis not present
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
## 2021-03-17 ENCOUNTER — Encounter: Payer: Self-pay | Admitting: Cardiovascular Disease

## 2021-03-17 ENCOUNTER — Ambulatory Visit: Payer: PPO | Admitting: Cardiovascular Disease

## 2021-03-17 ENCOUNTER — Other Ambulatory Visit: Payer: Self-pay

## 2021-03-17 VITALS — BP 168/94 | HR 72 | Ht 68.0 in | Wt 177.4 lb

## 2021-03-17 DIAGNOSIS — I700 Atherosclerosis of aorta: Secondary | ICD-10-CM

## 2021-03-17 DIAGNOSIS — E119 Type 2 diabetes mellitus without complications: Secondary | ICD-10-CM

## 2021-03-17 DIAGNOSIS — I10 Essential (primary) hypertension: Secondary | ICD-10-CM

## 2021-03-17 DIAGNOSIS — I77810 Thoracic aortic ectasia: Secondary | ICD-10-CM | POA: Diagnosis not present

## 2021-03-17 DIAGNOSIS — Z7901 Long term (current) use of anticoagulants: Secondary | ICD-10-CM | POA: Diagnosis not present

## 2021-03-17 DIAGNOSIS — I480 Paroxysmal atrial fibrillation: Secondary | ICD-10-CM | POA: Diagnosis not present

## 2021-03-17 NOTE — Patient Instructions (Signed)
Medication Instructions:  No changes *If you need a refill on your cardiac medications before your next appointment, please call your pharmacy*   Lab Work: None ordered If you have labs (blood work) drawn today and your tests are completely normal, you will receive your results only by: [place] (if you have MyChart) OR A paper copy in the mail If you have any lab test that is abnormal or we need to change your treatment, we will call you to review the results.   Testing/Procedures: None ordered   Follow-Up: At [facility], you and your health needs are our priority.  As part of our continuing mission to provide you with exceptional heart care, we have created designated Provider Care Teams.  These Care Teams include your primary Cardiologist (physician) and Advanced Practice Providers (APPs -  Physician Assistants and Nurse Practitioners) who all work together to provide you with the care you need, when you need it.  We recommend signing up for the patient portal called "MyChart".  Sign up information is provided on this After Visit Summary.  MyChart is used to connect with patients for Virtual Visits (Telemedicine).  Patients are able to view lab/test results, encounter notes, upcoming appointments, etc.  Non-urgent messages can be sent to your provider as well.   To learn more about what you can do with MyChart, go to [email or web address].    Your next appointment:   First available with Dr. Curt Bears

## 2021-04-05 ENCOUNTER — Encounter: Payer: Self-pay | Admitting: Cardiology

## 2021-04-05 ENCOUNTER — Other Ambulatory Visit: Payer: Self-pay

## 2021-04-05 ENCOUNTER — Ambulatory Visit: Payer: PPO | Admitting: Cardiology

## 2021-04-05 VITALS — BP 160/84 | HR 76 | Ht 68.0 in | Wt 175.0 lb

## 2021-04-05 DIAGNOSIS — I480 Paroxysmal atrial fibrillation: Secondary | ICD-10-CM | POA: Diagnosis not present

## 2021-04-05 DIAGNOSIS — Z01812 Encounter for preprocedural laboratory examination: Secondary | ICD-10-CM

## 2021-04-05 DIAGNOSIS — Z01818 Encounter for other preprocedural examination: Secondary | ICD-10-CM | POA: Diagnosis not present

## 2021-04-05 DIAGNOSIS — I4819 Other persistent atrial fibrillation: Secondary | ICD-10-CM

## 2021-04-05 MED ORDER — METOPROLOL TARTRATE 50 MG PO TABS: ORAL_TABLET | ORAL | 0 refills | Status: DC

## 2021-04-05 NOTE — Patient Instructions (Addendum)
Medication Instructions:  Your physician recommends that you continue on your current medications as directed. Please refer to the Current Medication list given to you today.  *If you need a refill on your cardiac medications before your next appointment, please call your pharmacy*   Lab Work: Pre procedure labs 06/30/2021:  BMP & CBC  If you have labs (blood work) drawn today and your tests are completely normal, you will receive your results only by: [place] (if you have MyChart) OR A paper copy in the mail If you have any lab test that is abnormal or we need to change your treatment, we will call you to review the results.   Testing/Procedures: Your physician has requested that you have cardiac CT within 7 days PRIOR to your ablation. Cardiac computed tomography (CT) is a painless test that uses an x-ray machine to take clear, detailed pictures of your heart.  Please follow instruction below located under "other instructions". You will get a call from our office to schedule the date for this test.  Your physician has recommended that you have an ablation. Catheter ablation is a medical procedure used to treat some cardiac arrhythmias (irregular heartbeats). During catheter ablation, a long, thin, flexible tube is put into a blood vessel in your groin (upper thigh), or neck. This tube is called an ablation catheter. It is then guided to your heart through the blood vessel. Radio frequency waves destroy small areas of heart tissue where abnormal heartbeats may cause an arrhythmia to start. Please follow instruction below located under "other instructions".   Follow-Up: At [facility], you and your health needs are our priority.  As part of our continuing mission to provide you with exceptional heart care, we have created designated Provider Care Teams.  These Care Teams include your primary Cardiologist (physician) and Advanced Practice Providers (APPs -  Physician Assistants and  Nurse Practitioners) who all work together to provide you with the care you need, when you need it.  We recommend signing up for the patient portal called "MyChart".  Sign up information is provided on this After Visit Summary.  MyChart is used to connect with patients for Virtual Visits (Telemedicine).  Patients are able to view lab/test results, encounter notes, upcoming appointments, etc.  Non-urgent messages can be sent to your provider as well.   To learn more about what you can do with MyChart, go to [email or web address].    Your next appointment:   1 month(s) after your ablation  The format for your next appointment:   In Person  Provider:   AFib clinic   Thank you for choosing [facility]!!   Trinidad Curet, RN [phone]    Other Instructions   CT INSTRUCTIONS Your cardiac CT will be scheduled at:  [facility] [address] [place], [place] [address] [phone]  Please [facility] at [facility] entrance of [facility] 30 minutes prior to test start time. Proceed to the [facility] Radiology Department (first floor) to check-in and test prep.   Please follow these instructions carefully (unless otherwise directed):  On the Night Before the Test: Be sure to Drink plenty of water. Do not consume any caffeinated/decaffeinated beverages or chocolate 12 hours prior to your test. Do not take any antihistamines 12 hours prior to your test.  On the Day of the Test: Drink plenty of water until 1 hour prior to the test. Do not eat any food 4 hours prior to the test.  You may take your regular medications prior to the test.  Take metoprolol (Lopressor) 50 mg two hours prior to test. HOLD Furosemide/Hydrochlorothiazide morning of the test. FEMALES- please wear underwire-free bra if available       After the Test: Drink plenty of water. After receiving IV contrast, you may experience a mild flushed feeling. This is normal. On  occasion, you may experience a mild rash up to 24 hours after the test. This is not dangerous. If this occurs, you can take Benadryl 25 mg and increase your fluid intake. If you experience trouble breathing, this can be serious. If it is severe call 911 IMMEDIATELY. If it is mild, please call our office. If you take any of these medications: Glipizide/Metformin, Avandament, Glucavance, please do not take 48 hours after completing test unless otherwise instructed.   Once we have confirmed authorization from your insurance company, we will call you to set up a date and time for your test. Based on how quickly your insurance processes prior authorizations requests, please allow up to 4 weeks to be contacted for scheduling your Cardiac CT appointment. Be advised that routine Cardiac CT appointments could be scheduled as many as 8 weeks after your provider has ordered it.  For non-scheduling related questions, please contact the cardiac imaging nurse navigator should you have any questions/concerns: Marchia Bond, Cardiac Imaging Nurse Navigator Gordy Clement, Cardiac Imaging Nurse Navigator [facility] Direct Office Dial: [phone]   For scheduling needs, including cancellations and rescheduling, please call [place], [phone].      Electrophysiology/Ablation Procedure Instructions   You are scheduled for a(n)  ablation on 07/12/2021 with Dr. Allegra Lai.   1.   Pre procedure testing-             A.  LAB WORK --- On 06/30/2021 for your pre procedure blood work.  You do NOT need to be fasting.  You can stop by the [employer] office anytime between 7:30 am - 4:30 pm.   On the day of your procedure 07/12/2021 you will go to [facility] hospital ([address]) at 6:30 am.  Dennis Bast will go to the main entrance A [employer]) and enter where the [employer] are.  Your driver will drop you off and you will head down the hallway to ADMITTING.  You may have one  support person come in to the hospital with you.  They will be asked to wait in the waiting room. It is OK to have someone drop you off and come back when you are ready to be discharged.   3.   Do not eat or drink after midnight prior to your procedure.   4.   On the morning of your procedure do NOT take any medication. Do not miss any doses of your blood thinner prior to the morning of your procedure or your procedure will need to be rescheduled.   5.  Plan for an overnight stay but you may be discharged after your procedure, if you use your phone frequently bring your phone charger. If you are discharged after your procedure you will need someone to drive you home and be with you for 24 hours after your procedure.   6. You will follow up with the AFIB clinic 4 weeks after your procedure.  You will follow up with Dr. Curt Bears  3 months after your procedure.  These appointments will be made for you.   7. FYI: For your safety,  and to allow [place] to monitor your vital signs accurately during the surgery/procedure we request that if you have artificial nails, gel coating, SNS etc. Please have those removed prior to your surgery/procedure. Not having the nail coverings /polish removed may result in cancellation or delay of your surgery/procedure.  * If you have ANY questions please call the office [phone] and ask for Sabine Tenenbaum RN or send me a MyChart message   * Occasionally, EP Studies and ablations can become lengthy.  Please make your family aware of this before your procedure starts.  Average time ranges from 2-8 hours for EP studies/ablations.  Your physician will call your family after the procedure with the results.                                     Cardiac Ablation Cardiac ablation is a procedure to destroy (ablate) some heart tissue that is sending bad signals. These bad signals cause problems in heart rhythm. The heart has many areas that make these signals. If there are problems in  these areas, they can make the heart beat in a way that is not normal. Destroying some tissues can help make the heart rhythm normal. Tell your doctor about: Any allergies you have. All medicines you are taking. These include vitamins, herbs, eye drops, creams, and over-the-counter medicines. Any problems you or family members have had with medicines that make you fall asleep (anesthetics). Any blood disorders you have. Any surgeries you have had. Any medical conditions you have, such as kidney failure. Whether you are pregnant or may be pregnant. What are the risks? This is a safe procedure. But problems may occur, including: Infection. Bruising and bleeding. Bleeding into the chest. Stroke or blood clots. Damage to nearby areas of your body. Allergies to medicines or dyes. The need for a pacemaker if the normal system is damaged. Failure of the procedure to treat the problem. What happens before the procedure? Medicines Ask your doctor about: Changing or stopping your normal medicines. This is important. Taking aspirin and ibuprofen. Do not take these medicines unless your doctor tells you to take them. Taking other medicines, vitamins, herbs, and supplements. General instructions Follow instructions from your doctor about what you cannot eat or drink. Plan to have someone take you home from the hospital or clinic. If you will be going home right after the procedure, plan to have someone with you for 24 hours. Ask your doctor what steps will be taken to prevent infection. What happens during the procedure?  An IV tube will be put into one of your veins. You will be given a medicine to help you relax. The skin on your neck or groin will be numbed. A cut (incision) will be made in your neck or groin. A needle will be put through your cut and into a large vein. A tube (catheter) will be put into the needle. The tube will be moved to your heart. Dye may be put through the tube.  This helps your doctor see your heart. Small devices (electrodes) on the tube will send out signals. A type of energy will be used to destroy some heart tissue. The tube will be taken out. Pressure will be held on your cut. This helps stop bleeding. A bandage will be put over your cut. The exact procedure may vary among doctors and hospitals. What happens after the procedure? You will be  watched until you leave the hospital or clinic. This includes checking your heart rate, breathing rate, oxygen, and blood pressure. Your cut will be watched for bleeding. You will need to lie still for a few hours. Do not drive for 24 hours or as long as your doctor tells you. Summary Cardiac ablation is a procedure to destroy some heart tissue. This is done to treat heart rhythm problems. Tell your doctor about any medical conditions you may have. Tell him or her about all medicines you are taking to treat them. This is a safe procedure. But problems may occur. These include infection, bruising, bleeding, and damage to nearby areas of your body. Follow what your doctor tells you about food and drink. You may also be told to change or stop some of your medicines. After the procedure, do not drive for 24 hours or as long as your doctor tells you. This information is not intended to replace advice given to you by your health care provider. Make sure you discuss any questions you have with your health care provider. Document Revised: 02/19/2019 Document Reviewed: 02/19/2019 Elsevier Patient Education  2022 [employer].

## 2021-04-05 NOTE — Progress Notes (Signed)
Electrophysiology Office Note   Date:  04/05/2021   ID:  Amanda Frost, DOB 1950/11/15, MRN [id number]  PCP:  Crist Infante, MD  Cardiologist:  [place] Primary Electrophysiologist:  Jenna Ardoin Meredith Leeds, MD    Chief Complaint: palpitations   History of Present Illness: Amanda SHINGLEDECKER is a 71 y.o. female who is being seen today for the evaluation of palpitations at the request of Crist Infante, MD. Presenting today for electrophysiology evaluation.  She has a history significant for paroxysmal atrial fibrillation, atrial tachycardia, hypertension, cirrhosis secondary to nonalcoholic steatohepatitis.  She had a cardioversion 11/22/2017 after she went to the emergency room in rapid atrial fibrillation.  She had a coronary CT that showed no evidence of coronary artery disease.  She was seen in the emergency room 08/25/2019 with recurrent atrial fibrillation that woke her from sleep.  She had cardioversion at that time.  She was again seen in the emergency room on 02/22/2021 with rapid atrial fibrillation.  She had persistent palpitations that did not abate after taking metoprolol.  She underwent successful cardioversion.  She was initially given flecainide, but she was worried about the side effects and did not start the medication.  Today, denies symptoms of palpitations, chest pain, shortness of breath, orthopnea, PND, lower extremity edema, claudication, dizziness, presyncope, syncope, bleeding, or neurologic sequela. The patient is tolerating medications without difficulties.  Cardioversion she is felt well.  She would like to avoid further medications and would prefer ablation.   Past Medical History:  Diagnosis Date   Hypertension    Liver cirrhosis secondary to NASH (nonalcoholic steatohepatitis) (HCC)    Ocular migraine    Palpitations    PAC's,PVC's,atrial tachycardia   Paroxysmal atrial fibrillation [facility])    Past Surgical History:  Procedure Laterality Date   BREAST  BIOPSY  1994   CARDIAC CATHETERIZATION  11/05/2007   normal LV fx,patent coronary arteries   NM MYOVIEW LTD  03/12/2005   normal   US ECHOCARDIOGRAPHY  11/26/2007   mild LVH,trace MR,TR     Current Outpatient Medications  Medication Sig Dispense Refill   adapalene (DIFFERIN) 0.1 % cream Apply 1 application topically daily.     apixaban (ELIQUIS) 5 MG TABS tablet Take 1 tablet (5 mg total) by mouth 2 (two) times daily. 180 tablet 1   Ascorbic Acid (VITAMIN C) 100 MG tablet Take 100 mg by mouth daily.     Cholecalciferol (VITAMIN D3) 5000 units CAPS Take 2 capsules by mouth every Monday, Tuesday, Wednesday, Thursday, and Friday.      fexofenadine (ALLEGRA) 180 MG tablet Take 180 mg by mouth daily as needed for allergies or rhinitis.     metoprolol succinate (TOPROL-XL) 50 MG 24 hr tablet TAKE 1 (50 MG) BY MOUTH EACH MORNING AND ONE (1/2) TABLET (25 MG) BY MOUTH EACH EVENING. 135 tablet 3   No current facility-administered medications for this visit.    Allergies:   Ciprofloxacin, Macrodantin [nitrofurantoin macrocrystal], Sulfa antibiotics, and Epinephrine   Social History:  The patient  reports that she has never smoked. She has never used smokeless tobacco. She reports that she does not drink alcohol and does not use drugs.   Family History:  The patient's family history includes Diabetes in her father and mother; Hypertension in her father and mother; Kidney disease in her mother; Migraines in her maternal grandmother, son, and son.   ROS:  Please see the history of present illness.   Otherwise, review of systems is positive for none.  All other systems are reviewed and negative.   PHYSICAL EXAM: VS:  BP (!) 160/84    Pulse 76    Ht 5\' 8"  (1.727 m)    Wt 175 lb (79.4 kg)    SpO2 97%    BMI 26.61 kg/m  , BMI Body mass index is 26.61 kg/m. GEN: Well nourished, well developed, in no acute distress  HEENT: normal  Neck: no JVD, carotid bruits, or masses Cardiac: RRR; no murmurs, rubs,  or gallops,no edema  Respiratory:  clear to auscultation bilaterally, normal work of breathing GI: soft, nontender, nondistended, + BS MS: no deformity or atrophy  Skin: warm and dry Neuro:  Strength and sensation are intact Psych: euthymic mood, full affect  EKG:  EKG is ordered today. Personal review of the ekg ordered shows sinus rhythm  Recent Labs: 02/22/2021: ALT 101; BUN 9; Creatinine, Ser 0.81; Hemoglobin 13.5; Magnesium 1.9; Platelets 254; Potassium 3.4; Sodium 137; TSH 9.468    Lipid Panel     Component Value Date/Time   CHOL  11/05/2007 0821    136        ATP III CLASSIFICATION:  <200     mg/dL   Desirable  200-239  mg/dL   Borderline High  >=240    mg/dL   High   TRIG 145 11/05/2007 0821   HDL 36 (L) 11/05/2007 0821   CHOLHDL 3.8 11/05/2007 0821   VLDL 29 11/05/2007 0821   LDLCALC  11/05/2007 0821    71        Total Cholesterol/HDL:CHD Risk Coronary Heart Disease Risk Table                     Men   Women  1/2 Average Risk   3.4   3.3     Wt Readings from Last 3 Encounters:  04/05/21 175 lb (79.4 kg)  03/17/21 177 lb 6.4 oz (80.5 kg)  02/22/21 175 lb (79.4 kg)      Other studies Reviewed: Additional studies/ records that were reviewed today include: Monitor 10/08/19  Review of the above records today demonstrates:  The dominant rhythm is normal sinus with normal circadian variation. There are rare episodes of very brief nonsustained ventricular tachycardia, 3-5 beats. There are rare episodes of brief nonsustained atrial tachycardia, 3-7 beats. The patient triggered recordings are almost all associated with abnormal rhythm, usually isolated PACs and PVCs No significant episodes of bradycardia or pauses are recorded. There is no atrial fibrillation.    ASSESSMENT AND PLAN:  1.  Paroxysmal atrial fibrillation/atrial tachycardia: Currently on Eliquis 5 mg twice daily, Toprol-XL 50 mg daily.  CHA2DS2-VASc of 3.  She has had multiple emergency room visits  for atrial fibrillation.  She would like to stay in rhythm.  She is concerned about antiarrhythmic side effects and would prefer to avoid medications.  Due to that, we Zahi Plaskett plan for ablation.  Risk, benefits, and alternatives to EP study and radiofrequency ablation for afib were also discussed in detail today. These risks include but are not limited to stroke, bleeding, vascular damage, tamponade, perforation, damage to the esophagus, lungs, and other structures, pulmonary vein stenosis, worsening renal function, and death. The patient understands these risk and wishes to proceed.  We Tayson Schnelle therefore proceed with catheter ablation at the next available time.  Carto, ICE, anesthesia are requested for the procedure.  Geovonni Meyerhoff also obtain CT PV protocol prior to the procedure to exclude LAA thrombus and further evaluate atrial anatomy.  2.  Hypertension: Currently well controlled at home.  Elevated today.  Cussed with primary cardiology  Current medicines are reviewed at length with the patient today.   The patient does not have concerns regarding her medicines.  The following changes were made today: none  Labs/ tests ordered today include:  Orders Placed This Encounter  Procedures   CT CARDIAC MORPH/PULM VEIN W/CM&W/O CA SCORE   Basic metabolic panel   CBC   EKG 12-Lead     Disposition:   FU with Zeven Kocak 3 months  Signed, Armoni Depass Meredith Leeds, MD  04/05/2021 12:28 PM     [place] [address] [place] [address] [phone] (office) [phone] (fax)

## 2021-04-06 DIAGNOSIS — H9192 Unspecified hearing loss, left ear: Secondary | ICD-10-CM | POA: Diagnosis not present

## 2021-04-06 DIAGNOSIS — H6123 Impacted cerumen, bilateral: Secondary | ICD-10-CM | POA: Diagnosis not present

## 2021-04-19 DIAGNOSIS — Z01419 Encounter for gynecological examination (general) (routine) without abnormal findings: Secondary | ICD-10-CM | POA: Diagnosis not present

## 2021-04-19 DIAGNOSIS — Z6826 Body mass index (BMI) 26.0-26.9, adult: Secondary | ICD-10-CM | POA: Diagnosis not present

## 2021-04-20 DIAGNOSIS — N3941 Urge incontinence: Secondary | ICD-10-CM | POA: Diagnosis not present

## 2021-04-21 ENCOUNTER — Other Ambulatory Visit: Payer: Self-pay | Admitting: Obstetrics and Gynecology

## 2021-04-21 DIAGNOSIS — R928 Other abnormal and inconclusive findings on diagnostic imaging of breast: Secondary | ICD-10-CM

## 2021-05-04 DIAGNOSIS — E1169 Type 2 diabetes mellitus with other specified complication: Secondary | ICD-10-CM | POA: Diagnosis not present

## 2021-05-04 DIAGNOSIS — M810 Age-related osteoporosis without current pathological fracture: Secondary | ICD-10-CM | POA: Diagnosis not present

## 2021-05-04 DIAGNOSIS — J302 Other seasonal allergic rhinitis: Secondary | ICD-10-CM | POA: Diagnosis not present

## 2021-05-04 DIAGNOSIS — K219 Gastro-esophageal reflux disease without esophagitis: Secondary | ICD-10-CM | POA: Diagnosis not present

## 2021-05-04 DIAGNOSIS — E785 Hyperlipidemia, unspecified: Secondary | ICD-10-CM | POA: Diagnosis not present

## 2021-05-04 DIAGNOSIS — K760 Fatty (change of) liver, not elsewhere classified: Secondary | ICD-10-CM | POA: Diagnosis not present

## 2021-05-04 DIAGNOSIS — D6869 Other thrombophilia: Secondary | ICD-10-CM | POA: Diagnosis not present

## 2021-05-04 DIAGNOSIS — I10 Essential (primary) hypertension: Secondary | ICD-10-CM | POA: Diagnosis not present

## 2021-05-04 DIAGNOSIS — I480 Paroxysmal atrial fibrillation: Secondary | ICD-10-CM | POA: Diagnosis not present

## 2021-05-16 ENCOUNTER — Other Ambulatory Visit: Payer: PPO

## 2021-05-17 ENCOUNTER — Telehealth: Payer: Self-pay | Admitting: Cardiovascular Disease

## 2021-05-17 NOTE — Telephone Encounter (Signed)
Agree with the advice provided. No change in medications recommended, at least not until the ablation.

## 2021-05-17 NOTE — Telephone Encounter (Signed)
STAT if patient feels like he/she is going to faint   Are you dizzy now? She says she is lightheaded not dizzy- she felt lightheaded a few minutes ago  Do you feel faint or have you passed out? She felt like she was going to pass out last night, everything became black  Do you have any other symptoms? Stress in her life  Have you checked your HR and BP (record if available)? 125/80 last night

## 2021-05-17 NOTE — Telephone Encounter (Signed)
Patient has been made aware. She will call back if anything further is needed.

## 2021-05-17 NOTE — Telephone Encounter (Signed)
Returned the call to the patient. She stated that last night she had an episode where she felt a heart flutter, got light headed and things went black for a few seconds. She did not pass out.   She said she may have been in afib but is not sure. Her blood pressure after the episode was 125/80  and blood sugar was 125.   She did state that she had not had anything to drink or eat since 6 am that morning and then came home stressed. She has been advised that she needs to be sure to stay hydrated through out the day and eat snacks if she cannot eat a full meal.   She feels fine today but wanted to make the provider aware in case they have any other recommendations Ablation scheduled for April.

## 2021-05-18 ENCOUNTER — Telehealth: Payer: Self-pay | Admitting: Cardiovascular Disease

## 2021-05-18 MED ORDER — METOPROLOL SUCCINATE ER 50 MG PO TB24: ORAL_TABLET | ORAL | 3 refills | Status: DC

## 2021-05-18 NOTE — Telephone Encounter (Signed)
°*  STAT* If patient is at the pharmacy, call can be transferred to refill team.   1. Which medications need to be refilled? (please list name of each medication and dose if known)  metoprolol succinate (TOPROL-XL) 50 MG 24 hr tablet  2. Which pharmacy/location (including street and city if local pharmacy) is medication to be sent to? [id number] - [place], [place] - [address] AT [place]  3. Do they need a 30 day or 90 day supply? 90 with refills  Patient is out of medication

## 2021-05-19 ENCOUNTER — Other Ambulatory Visit: Payer: Self-pay

## 2021-05-24 ENCOUNTER — Telehealth: Payer: Self-pay | Admitting: Cardiovascular Disease

## 2021-05-24 NOTE — Telephone Encounter (Signed)
Patient c/o Palpitations:  High priority if patient c/o lightheadedness, shortness of breath, or chest pain  How long have you had palpitations/irregular HR/ Afib? Are you having the symptoms now? Last week, no  Are you currently experiencing lightheadedness, SOB or CP? no  Do you have a history of afib (atrial fibrillation) or irregular heart rhythm? afib  Have you checked your BP or HR? (document readings if available): HR was 63 and BP was okay over the weekend  Are you experiencing any other symptoms? Feels faint right after   Patient states she has been having a "funny sensation" in her heart that lasts maybe 5 seconds. She says when this happens she also gets a very lightheaded feeling. She says she had called in last week and spoke with a nurse about it and was told it was probably stress or dehydration. She says she is still having it and it will happen about 10 times in a day. She says this afternoon she has felt it 4 times.

## 2021-05-24 NOTE — Telephone Encounter (Signed)
Patient reports palpitations and thinks it may be stress-related. She doesn't think or feel that it is afib. She had 4-5 episodes today with  brief bouts of light-headedness/feeling faint. She stated her BP and blood sugar are WNL. She thinks it may be PVCs because she can cough convert. She is due for ablation in April. She again mentioned anxiety-related palpitations. I recommended she see her PCP for that. Please advise.

## 2021-05-24 NOTE — Telephone Encounter (Signed)
Please send in a prescription for metoprolol tartrate 25 mg tablets, #30, 1 refill.  She can take 1 of these tablets once or twice a day as needed for the palpitations.  She can take them in addition to the long-acting metoprolol succinate that she takes on a regular basis.  Toprol tartrate is faster acting and more useful to use as a "as needed" medication.

## 2021-05-25 ENCOUNTER — Telehealth: Payer: Self-pay | Admitting: Cardiovascular Disease

## 2021-05-25 DIAGNOSIS — L2089 Other atopic dermatitis: Secondary | ICD-10-CM | POA: Diagnosis not present

## 2021-05-25 DIAGNOSIS — Z85828 Personal history of other malignant neoplasm of skin: Secondary | ICD-10-CM | POA: Diagnosis not present

## 2021-05-25 MED ORDER — METOPROLOL TARTRATE 25 MG PO TABS: ORAL_TABLET | ORAL | 1 refills | Status: DC

## 2021-05-25 NOTE — Telephone Encounter (Signed)
Called the patient to advise her about the Metoprolol Tartrate prn. She stated that she was asymptomatic currently and felt much better. She has been advised to call if anything further is needed.   Per other message, ED precautions have been given again if  the Tartrate does not help and symptoms worsen.

## 2021-05-25 NOTE — Telephone Encounter (Signed)
Patient reports that for the past hour (on and off), she feels skipping beats for about 5 seconds and then the heart will flutter. She gets dizzy and has a "passed-out" feeling during the episodes. Advised that if this continues, she needs to go to an urgent care or ED. She voiced understanding of this conversation.

## 2021-05-25 NOTE — Telephone Encounter (Signed)
Please see other epic note

## 2021-05-25 NOTE — Addendum Note (Signed)
Addended by: Ricci Barker on: 05/25/2021 12:43 PM   Modules accepted: Orders

## 2021-05-25 NOTE — Telephone Encounter (Signed)
patient is requesting to speak with you once again....says that she forgot to tell you this morning

## 2021-06-05 ENCOUNTER — Other Ambulatory Visit: Payer: PPO

## 2021-06-14 ENCOUNTER — Ambulatory Visit: Payer: PPO | Admitting: Neurology

## 2021-06-16 ENCOUNTER — Other Ambulatory Visit: Payer: Self-pay | Admitting: Cardiovascular Disease

## 2021-06-29 ENCOUNTER — Telehealth: Payer: Self-pay | Admitting: Cardiology

## 2021-06-29 NOTE — Telephone Encounter (Signed)
Attempted to reach pt, unable to leave message/voicemail full ?

## 2021-06-29 NOTE — Telephone Encounter (Signed)
Asking about taking Metoprolol day of CT. ?Reports HRs avg 60-70s at home, sometimes higher is doing something. ?Pt advised it HR 60-70s morning of CT to take Lopressor 25 mg, if higher then she should take 50 mg. ?Patient verbalized understanding and agreeable to plan.  ? ? ?

## 2021-06-29 NOTE — Telephone Encounter (Signed)
° °  Transferred to RN °

## 2021-06-29 NOTE — Telephone Encounter (Signed)
Patient has 2 questions about the her upcoming CT scan.  ?

## 2021-06-30 ENCOUNTER — Other Ambulatory Visit: Payer: PPO | Admitting: *Deleted

## 2021-06-30 DIAGNOSIS — I480 Paroxysmal atrial fibrillation: Secondary | ICD-10-CM | POA: Diagnosis not present

## 2021-06-30 DIAGNOSIS — Z01812 Encounter for preprocedural laboratory examination: Secondary | ICD-10-CM | POA: Diagnosis not present

## 2021-06-30 LAB — CBC
Hematocrit: 40.9 % (ref 34.0–46.6)
Hemoglobin: 13.5 g/dL (ref 11.1–15.9)
MCH: 30.2 pg (ref 26.6–33.0)
MCHC: 33 g/dL (ref 31.5–35.7)
MCV: 92 fL (ref 79–97)
Platelets: 233 10*3/uL (ref 150–450)
RBC: 4.47 x10E6/uL (ref 3.77–5.28)
RDW: 13 % (ref 11.7–15.4)
WBC: 6.5 10*3/uL (ref 3.4–10.8)

## 2021-06-30 LAB — BASIC METABOLIC PANEL
BUN/Creatinine Ratio: 11 — ABNORMAL LOW (ref 12–28)
BUN: 10 mg/dL (ref 8–27)
CO2: 28 mmol/L (ref 20–29)
Calcium: 10.3 mg/dL (ref 8.7–10.3)
Chloride: 100 mmol/L (ref 96–106)
Creatinine, Ser: 0.89 mg/dL (ref 0.57–1.00)
Glucose: 139 mg/dL — ABNORMAL HIGH (ref 70–99)
Potassium: 4.4 mmol/L (ref 3.5–5.2)
Sodium: 143 mmol/L (ref 134–144)
eGFR: 70 mL/min/{1.73_m2} (ref >59)

## 2021-07-04 ENCOUNTER — Telehealth (HOSPITAL_COMMUNITY): Payer: Self-pay | Admitting: Emergency Medicine

## 2021-07-04 NOTE — Telephone Encounter (Signed)
Reaching out to patient to offer assistance regarding upcoming cardiac imaging study; pt verbalizes understanding of appt date/time, parking situation and where to check in, pre-test NPO status and medications ordered, and verified current allergies; name and call back number provided for further questions should they arise ?Amanda Bond RN Navigator Cardiac Imaging ?[facility] ?[phone] office ?[phone] cell ? ? ?Taking daily meds + PRN metoprolol tartrate if HR > 75 ?Denies iv issues ?Arrival 800 ?

## 2021-07-05 ENCOUNTER — Ambulatory Visit (HOSPITAL_COMMUNITY): Payer: PPO

## 2021-07-05 ENCOUNTER — Ambulatory Visit (HOSPITAL_COMMUNITY)
Admission: RE | Admit: 2021-07-05 | Discharge: 2021-07-05 | Disposition: A | Discharge status: Home / Self Care | Payer: PPO | Source: Ambulatory Visit | Attending: Cardiology | Admitting: Cardiology

## 2021-07-05 DIAGNOSIS — I4819 Other persistent atrial fibrillation: Secondary | ICD-10-CM | POA: Insufficient documentation

## 2021-07-05 MED ORDER — IOHEXOL 350 MG/ML SOLN
100.0000 mL | Freq: Once | INTRAVENOUS | Status: AC | PRN
  Administered 2021-07-05: 100 mL via INTRAVENOUS

## 2021-07-10 NOTE — Pre-Procedure Instructions (Signed)
Attempted to call patient regarding procedure instructions.  Voicemail unable to accept voicemail. ?

## 2021-07-11 ENCOUNTER — Encounter (HOSPITAL_COMMUNITY): Payer: Self-pay | Admitting: Cardiology

## 2021-07-11 ENCOUNTER — Telehealth: Payer: Self-pay | Admitting: Cardiology

## 2021-07-11 NOTE — Pre-Procedure Instructions (Signed)
Instructed patient on the following items: Arrival time 0630 Nothing to eat or drink after midnight No meds AM of procedure Responsible person to drive you home and stay with you for 24 hrs  Have you missed any doses of anti-coagulant Eliquis- hasn't missed any doses.   

## 2021-07-11 NOTE — Telephone Encounter (Signed)
Patient's husband called, patient has an ablation tomorrow at 8:30am and he states that they have an issue they need to discuss with a nurse ASAP. Aware Sherri P, RN is off this week. Please advise.  ?

## 2021-07-11 NOTE — Anesthesia Preprocedure Evaluation (Addendum)
Anesthesia Evaluation  ?Patient identified by MRN, date of birth, ID band ?Patient awake ? ? ? ?Reviewed: ?Allergy & Precautions, NPO status , Patient's Chart, lab work & pertinent test results, reviewed documented beta blocker date and time  ? ?Airway ?Mallampati: III ? ?TM Distance: >3 FB ?Neck ROM: Limited ? ? ?Comment: Patient has "stiff neck" and difficulty with movement Dental ? ?(+) Teeth Intact, Caps, Dental Advisory Given, Implants ?  ?Pulmonary ? ?  ?Pulmonary exam normal ?breath sounds clear to auscultation ? ? ? ? ? ? Cardiovascular ?hypertension, Pt. on medications and Pt. on home beta blockers ?Normal cardiovascular exam+ dysrhythmias Atrial Fibrillation  ?Rhythm:Regular Rate:Normal ? ? ?  ?Neuro/Psych ? Headaches, Ocular migraines ?negative psych ROS  ? GI/Hepatic ?negative GI ROS, (+) Cirrhosis  ?  ?  ? , Hepatitis -, UnspecifiedHx/o NASH ?  ?Endo/Other  ?negative endocrine ROS ? Renal/GU ?negative Renal ROS  ?negative genitourinary ?  ?Musculoskeletal ?negative musculoskeletal ROS ?(+)  ? Abdominal ?  ?Peds ? Hematology ?Eliauis therapy- last dose   ?Anesthesia Other Findings ? ? Reproductive/Obstetrics ? ?  ? ? ? ? ? ? ? ? ? ? ? ? ? ?  ?  ? ? ? ? ? ? ? ?Anesthesia Physical ?Anesthesia Plan ? ?ASA: 3 ? ?Anesthesia Plan: General  ? ?Post-op Pain Management:   ? ?Induction: Intravenous ? ?PONV Risk Score and Plan: 3 and Treatment may vary due to age or medical condition, Ondansetron and Dexamethasone ? ?Airway Management Planned: Oral ETT and Video Laryngoscope Planned ? ?Additional Equipment: None ? ?Intra-op Plan:  ? ?Post-operative Plan: Extubation in OR ? ?Informed Consent: I have reviewed the patients History and Physical, chart, labs and discussed the procedure including the risks, benefits and alternatives for the proposed anesthesia with the patient or authorized representative who has indicated his/her understanding and acceptance.  ? ? ? ?Dental advisory  given ? ?Plan Discussed with: CRNA and Anesthesiologist ? ?Anesthesia Plan Comments:   ? ? ? ? ? ?Anesthesia Quick Evaluation ? ?

## 2021-07-11 NOTE — Telephone Encounter (Signed)
Pt reports she woke up w a bad neck spasm.  She wanted to see if she could take Tylenol for this today.  Concern was because of procedure tomorrow.  I adv her to take tylenol today as needed.  She is appreciative for information. ?

## 2021-07-12 ENCOUNTER — Encounter (HOSPITAL_COMMUNITY)
Admission: RE | Disposition: A | Discharge status: Home / Self Care | Payer: PPO | Source: Home / Self Care | Attending: Cardiology

## 2021-07-12 ENCOUNTER — Ambulatory Visit (HOSPITAL_COMMUNITY)
Admission: RE | Admit: 2021-07-12 | Discharge: 2021-07-12 | Disposition: A | Discharge status: Home / Self Care | Payer: PPO | Attending: Cardiology | Admitting: Cardiology

## 2021-07-12 ENCOUNTER — Telehealth: Payer: Self-pay | Admitting: Physician Assistant

## 2021-07-12 ENCOUNTER — Encounter (HOSPITAL_COMMUNITY): Payer: Self-pay | Admitting: Cardiology

## 2021-07-12 ENCOUNTER — Other Ambulatory Visit: Payer: Self-pay

## 2021-07-12 ENCOUNTER — Ambulatory Visit (HOSPITAL_COMMUNITY): Payer: PPO | Admitting: Anesthesiology

## 2021-07-12 ENCOUNTER — Ambulatory Visit (HOSPITAL_BASED_OUTPATIENT_CLINIC_OR_DEPARTMENT_OTHER): Payer: PPO | Admitting: Anesthesiology

## 2021-07-12 DIAGNOSIS — I4891 Unspecified atrial fibrillation: Secondary | ICD-10-CM

## 2021-07-12 DIAGNOSIS — I480 Paroxysmal atrial fibrillation: Secondary | ICD-10-CM | POA: Diagnosis not present

## 2021-07-12 DIAGNOSIS — I471 Supraventricular tachycardia: Secondary | ICD-10-CM | POA: Diagnosis not present

## 2021-07-12 DIAGNOSIS — K7460 Unspecified cirrhosis of liver: Secondary | ICD-10-CM | POA: Diagnosis not present

## 2021-07-12 DIAGNOSIS — K7581 Nonalcoholic steatohepatitis (NASH): Secondary | ICD-10-CM | POA: Diagnosis not present

## 2021-07-12 DIAGNOSIS — K759 Inflammatory liver disease, unspecified: Secondary | ICD-10-CM | POA: Diagnosis not present

## 2021-07-12 DIAGNOSIS — I10 Essential (primary) hypertension: Secondary | ICD-10-CM | POA: Insufficient documentation

## 2021-07-12 HISTORY — PX: ATRIAL FIBRILLATION ABLATION: EP1191

## 2021-07-12 LAB — POCT ACTIVATED CLOTTING TIME
Activated Clotting Time: 317 seconds
Activated Clotting Time: 299 seconds

## 2021-07-12 SURGERY — ATRIAL FIBRILLATION ABLATION
Anesthesia: General

## 2021-07-12 MED ORDER — SODIUM CHLORIDE 0.9% FLUSH: 3.0000 mL | Freq: Two times a day (BID) | INTRAVENOUS | Status: DC

## 2021-07-12 MED ORDER — SODIUM CHLORIDE 0.9 % IV SOLN: 250.0000 mL | INTRAVENOUS | Status: DC | PRN

## 2021-07-12 MED ORDER — HEPARIN (PORCINE) IN NACL 1000-0.9 UT/500ML-% IV SOLN
INTRAVENOUS | Status: AC
  Filled 2021-07-12: qty 500

## 2021-07-12 MED ORDER — ACETAMINOPHEN 325 MG PO TABS: 650.0000 mg | ORAL_TABLET | ORAL | Status: DC | PRN

## 2021-07-12 MED ORDER — LIDOCAINE 2% (20 MG/ML) 5 ML SYRINGE
INTRAMUSCULAR | Status: DC | PRN
  Administered 2021-07-12: 60 mg via INTRAVENOUS

## 2021-07-12 MED ORDER — FENTANYL CITRATE (PF) 100 MCG/2ML IJ SOLN
INTRAMUSCULAR | Status: DC | PRN
  Administered 2021-07-12: 100 ug via INTRAVENOUS

## 2021-07-12 MED ORDER — FENTANYL CITRATE (PF) 100 MCG/2ML IJ SOLN: 25.0000 ug | INTRAMUSCULAR | Status: DC | PRN

## 2021-07-12 MED ORDER — ACETAMINOPHEN 500 MG PO TABS
ORAL_TABLET | ORAL | Status: AC
  Filled 2021-07-12: qty 2

## 2021-07-12 MED ORDER — HEPARIN SODIUM (PORCINE) 1000 UNIT/ML IJ SOLN
INTRAMUSCULAR | Status: DC | PRN
  Administered 2021-07-12: 1000 [IU] via INTRAVENOUS

## 2021-07-12 MED ORDER — HEPARIN SODIUM (PORCINE) 1000 UNIT/ML IJ SOLN
INTRAMUSCULAR | Status: DC | PRN
  Administered 2021-07-12: 14000 [IU] via INTRAVENOUS
  Administered 2021-07-12: 3000 [IU] via INTRAVENOUS
  Administered 2021-07-12: 2000 [IU] via INTRAVENOUS

## 2021-07-12 MED ORDER — ONDANSETRON HCL 4 MG/2ML IJ SOLN: 4.0000 mg | Freq: Once | INTRAMUSCULAR | Status: DC | PRN

## 2021-07-12 MED ORDER — SODIUM CHLORIDE 0.9% FLUSH: 3.0000 mL | INTRAVENOUS | Status: DC | PRN

## 2021-07-12 MED ORDER — HEPARIN (PORCINE) IN NACL 1000-0.9 UT/500ML-% IV SOLN
INTRAVENOUS | Status: DC | PRN
  Administered 2021-07-12 (×5): 500 mL

## 2021-07-12 MED ORDER — DEXAMETHASONE SODIUM PHOSPHATE 10 MG/ML IJ SOLN
INTRAMUSCULAR | Status: DC | PRN
  Administered 2021-07-12: 5 mg via INTRAVENOUS

## 2021-07-12 MED ORDER — SODIUM CHLORIDE 0.9 % IV SOLN: INTRAVENOUS | Status: DC

## 2021-07-12 MED ORDER — PROTAMINE SULFATE 10 MG/ML IV SOLN
INTRAVENOUS | Status: DC | PRN
  Administered 2021-07-12: 40 mg via INTRAVENOUS

## 2021-07-12 MED ORDER — HEPARIN SODIUM (PORCINE) 1000 UNIT/ML IJ SOLN
INTRAMUSCULAR | Status: AC
  Filled 2021-07-12: qty 10

## 2021-07-12 MED ORDER — OXYCODONE HCL 5 MG/5ML PO SOLN: 5.0000 mg | Freq: Once | ORAL | Status: DC | PRN

## 2021-07-12 MED ORDER — OXYCODONE HCL 5 MG PO TABS: 5.0000 mg | ORAL_TABLET | Freq: Once | ORAL | Status: DC | PRN

## 2021-07-12 MED ORDER — ONDANSETRON HCL 4 MG/2ML IJ SOLN
INTRAMUSCULAR | Status: DC | PRN
  Administered 2021-07-12: 4 mg via INTRAVENOUS

## 2021-07-12 MED ORDER — METHOCARBAMOL 1000 MG/10ML IJ SOLN
500.0000 mg | INTRAVENOUS | Status: AC
  Administered 2021-07-12: 500 mg via INTRAVENOUS
  Filled 2021-07-12: qty 5

## 2021-07-12 MED ORDER — PROPOFOL 10 MG/ML IV BOLUS
INTRAVENOUS | Status: DC | PRN
  Administered 2021-07-12: 50 mg via INTRAVENOUS
  Administered 2021-07-12: 150 mg via INTRAVENOUS
  Administered 2021-07-12: 40 mg via INTRAVENOUS
  Administered 2021-07-12: 50 mg via INTRAVENOUS
  Administered 2021-07-12: 20 mg via INTRAVENOUS

## 2021-07-12 MED ORDER — DOBUTAMINE INFUSION FOR EP/ECHO/NUC (1000 MCG/ML)
INTRAVENOUS | Status: AC
  Filled 2021-07-12: qty 250

## 2021-07-12 MED ORDER — ONDANSETRON HCL 4 MG/2ML IJ SOLN: 4.0000 mg | Freq: Four times a day (QID) | INTRAMUSCULAR | Status: DC | PRN

## 2021-07-12 MED ORDER — SUGAMMADEX SODIUM 200 MG/2ML IV SOLN
INTRAVENOUS | Status: DC | PRN
  Administered 2021-07-12: 200 mg via INTRAVENOUS

## 2021-07-12 MED ORDER — ROCURONIUM BROMIDE 10 MG/ML (PF) SYRINGE
PREFILLED_SYRINGE | INTRAVENOUS | Status: DC | PRN
  Administered 2021-07-12: 70 mg via INTRAVENOUS

## 2021-07-12 MED ORDER — PHENYLEPHRINE HCL-NACL 20-0.9 MG/250ML-% IV SOLN
INTRAVENOUS | Status: DC | PRN
  Administered 2021-07-12: 25 ug/min via INTRAVENOUS

## 2021-07-12 MED ORDER — DOBUTAMINE INFUSION FOR EP/ECHO/NUC (1000 MCG/ML)
INTRAVENOUS | Status: DC | PRN
Start: 2021-07-12 — End: 2021-07-12
  Administered 2021-07-12: 20 ug/kg/min via INTRAVENOUS

## 2021-07-12 MED ORDER — PHENYLEPHRINE 40 MCG/ML (10ML) SYRINGE FOR IV PUSH (FOR BLOOD PRESSURE SUPPORT)
PREFILLED_SYRINGE | INTRAVENOUS | Status: DC | PRN
Start: 2021-07-12 — End: 2021-07-12
  Administered 2021-07-12: 80 ug via INTRAVENOUS

## 2021-07-12 MED ORDER — ACETAMINOPHEN 500 MG PO TABS
1000.0000 mg | ORAL_TABLET | Freq: Once | ORAL | Status: AC
  Administered 2021-07-12: 1000 mg via ORAL

## 2021-07-12 SURGICAL SUPPLY — 21 items
BAG SNAP BAND KOVER 36X36 (MISCELLANEOUS) ×1 IMPLANT
BLANKET WARM UNDERBOD FULL ACC (MISCELLANEOUS) ×2 IMPLANT
CATH 8FR REPROCESSED SOUNDSTAR (CATHETERS) ×2 IMPLANT
CATH 8FR SOUNDSTAR REPROCESSED (CATHETERS) IMPLANT
CATH OCTARAY 2.0 F 3-3-3-3-3 (CATHETERS) ×1 IMPLANT
CATH S CIRCA THERM PROBE 10F (CATHETERS) ×1 IMPLANT
CATH SMTCH THERMOCOOL SF DF (CATHETERS) ×1 IMPLANT
CATH WEBSTER BI DIR CS D-F CRV (CATHETERS) ×1 IMPLANT
CLOSURE PERCLOSE PROSTYLE (VASCULAR PRODUCTS) ×4 IMPLANT
COVER SWIFTLINK CONNECTOR (BAG) ×2 IMPLANT
KIT VERSACROSS STEERABLE D1 (CATHETERS) ×1 IMPLANT
PACK EP LATEX FREE (CUSTOM PROCEDURE TRAY) ×2
PACK EP LF (CUSTOM PROCEDURE TRAY) ×1 IMPLANT
PAD DEFIB RADIO PHYSIO CONN (PAD) ×2 IMPLANT
PATCH CARTO3 (PAD) ×1 IMPLANT
SHEATH CARTO VIZIGO SM CVD (SHEATH) ×1 IMPLANT
SHEATH PINNACLE 7F 10CM (SHEATH) ×1 IMPLANT
SHEATH PINNACLE 8F 10CM (SHEATH) ×2 IMPLANT
SHEATH PINNACLE 9F 10CM (SHEATH) ×1 IMPLANT
SHEATH PROBE COVER 6X72 (BAG) ×1 IMPLANT
TUBING SMART ABLATE COOLFLOW (TUBING) ×1 IMPLANT

## 2021-07-12 NOTE — Telephone Encounter (Signed)
Paged by answering service. Patient had ablation done today. After going home, patient had an episode of palpitation with dizzy feeling. Felt like typical afib.  ? ?Asymptomatic during my conversation.  ? ?Advised that patient can have breakthrough episodes for weeks after ablation. No chest pain or dyspnea. Reviewed call back precautions.  ? ? ?

## 2021-07-12 NOTE — Progress Notes (Signed)
O2 sats dropping to 88 if client resting and O2 started at 2l/min via nasal cannula ?

## 2021-07-12 NOTE — Progress Notes (Signed)
Dr Curt Bears in and client to room air ?

## 2021-07-12 NOTE — Progress Notes (Signed)
Client c/o chest pressure 2/10 and Dr Curt Bears in to see and notified and no new orders and ok to d/c home ?

## 2021-07-12 NOTE — Transfer of Care (Signed)
Immediate Anesthesia Transfer of Care Note ? ?Patient: Amanda Frost ? ?Procedure(s) Performed: ATRIAL FIBRILLATION ABLATION ? ?Patient Location: Cath Lab ? ?Anesthesia Type:General ? ?Level of Consciousness: awake and oriented ? ?Airway & Oxygen Therapy: Patient Spontanous Breathing and Patient connected to nasal cannula oxygen ? ?Post-op Assessment: Report given to RN and Post -op Vital signs reviewed and stable ? ?Post vital signs: Reviewed and stable ? ?Last Vitals:  ?Vitals Value Taken Time  ?BP 134/65 07/12/21 1043  ?Temp 36.8 ?C 07/12/21 1045  ?Pulse 83 07/12/21 1047  ?Resp 18 07/12/21 1047  ?SpO2 96 % 07/12/21 1047  ?Vitals shown include unvalidated device data. ? ?Last Pain:  ?Vitals:  ? 07/12/21 1045  ?TempSrc: Temporal  ?PainSc: Asleep  ?   ? ?  ? ?Complications: There were no known notable events for this encounter. ?

## 2021-07-12 NOTE — Discharge Instructions (Signed)
Post procedure care instructions ?No driving for 4 days. No lifting over 5 lbs for 1 week. No vigorous or sexual activity for 1 week. You may return to work/your usual activities on 07/20/21. Keep procedure site clean & dry. If you notice increased pain, swelling, bleeding or pus, call/return!  You may shower after 24 hours, but no soaking in baths/hot tubs/pools for 1 week.  ? ? ? ?You have an appointment set up with the [facility].  Multiple studies have shown that being followed by a dedicated atrial fibrillation clinic in addition to the standard care you receive from your other physicians improves health. We believe that enrollment in the atrial fibrillation clinic will allow [place] to better care for you.  ? ?The phone number to the [facility] is [phone]. The clinic is staffed Monday through Friday from 8:30am to 5pm. ? ?Parking Directions: The clinic is located in the Heart and Vascular Building connected to [facility]. ?1)From [address] turn on to [address] and go to the 3rd entrance  (Heart and Vascular entrance) on the right. ?2)Look to the right for Heart &Vascular Parking Garage. ?3)A code for the entrance is required, for May is 1002.   ?4)Take the elevators to the 1st floor. Registration is in the room with the glass walls at the end of the hallway. ? ?If you have any trouble parking or locating the clinic, please don?t hesitate to call [phone].  ?

## 2021-07-12 NOTE — Anesthesia Postprocedure Evaluation (Signed)
Anesthesia Post Note ? ?Patient: Mikenna Bunkley Cangemi ? ?Procedure(s) Performed: ATRIAL FIBRILLATION ABLATION ? ?  ? ?Patient location during evaluation: PACU ?Anesthesia Type: General ?Level of consciousness: awake and alert and oriented ?Pain management: pain level controlled ?Vital Signs Assessment: post-procedure vital signs reviewed and stable ?Respiratory status: spontaneous breathing, nonlabored ventilation and respiratory function stable ?Cardiovascular status: blood pressure returned to baseline and stable ?Postop Assessment: no apparent nausea or vomiting ?Anesthetic complications: no ? ? ?There were no known notable events for this encounter. ? ?Last Vitals:  ?Vitals:  ? 07/12/21 1230 07/12/21 1300  ?BP: 129/78 121/72  ?Pulse: 76 77  ?Resp: 11 16  ?Temp:    ?SpO2: 97% 91%  ?  ?Last Pain:  ?Vitals:  ? 07/12/21 1245  ?TempSrc:   ?PainSc: 0-No pain  ? ? ?  ?  ?  ?  ?  ?  ? ?Chanci Ojala A. ? ? ? ? ?

## 2021-07-12 NOTE — H&P (Signed)
? ?Electrophysiology Office Note ? ? ?Date:  07/12/2021  ? ?ID:  Amanda Frost, DOB 1950-05-19, MRN [id number] ? ?PCP:  Amanda Infante, MD  ?Cardiologist:  Amanda Frost ?Primary Electrophysiologist:  Amanda Guest Meredith Leeds, MD   ? ?Chief Complaint: palpitations ?  ?History of Present Illness: ?Amanda Frost is a 71 y.o. female who is being seen today for the evaluation of palpitations at the request of No ref. provider found. Presenting today for electrophysiology evaluation. ? ?She has a history significant for paroxysmal atrial fibrillation, atrial tachycardia, hypertension, cirrhosis secondary to nonalcoholic steatohepatitis.  She had a cardioversion 11/22/2017 after she went to the emergency room in rapid atrial fibrillation.  She had a coronary CT that showed no evidence of coronary artery disease.  She was seen in the emergency room 08/25/2019 with recurrent atrial fibrillation that woke her from sleep.  She had cardioversion at that time.  She was again seen in the emergency room on 02/22/2021 with rapid atrial fibrillation.  She had persistent palpitations that did not abate after taking metoprolol.  She underwent successful cardioversion.  She was initially given flecainide, but she was worried about the side effects and did not start the medication. ? ?Today, denies symptoms of palpitations, chest pain, shortness of breath, orthopnea, PND, lower extremity edema, claudication, dizziness, presyncope, syncope, bleeding, or neurologic sequela. The patient is tolerating medications without difficulties. Plan for ablation today.  ? ? ?Past Medical History:  ?Diagnosis Date  ? Hypertension   ? Liver cirrhosis secondary to NASH (nonalcoholic steatohepatitis) (Amanda Frost)   ? Ocular migraine   ? Palpitations   ? PAC's,PVC's,atrial tachycardia  ? Paroxysmal atrial fibrillation (HCC)   ? ?Past Surgical History:  ?Procedure Laterality Date  ? BREAST BIOPSY  1994  ? CARDIAC CATHETERIZATION  11/05/2007  ? normal LV fx,patent  coronary arteries  ? NM MYOVIEW LTD  03/12/2005  ? normal  ? US ECHOCARDIOGRAPHY  11/26/2007  ? mild LVH,trace MR,TR  ? ? ? ?Current Facility-Administered Medications  ?Medication Dose Route Frequency Provider Last Rate Last Admin  ? 0.9 %  sodium chloride infusion   Intravenous Continuous Amanda Frost, Amanda Doyne, MD      ? ? ?Allergies:   Ciprofloxacin, Macrodantin [nitrofurantoin macrocrystal], Sulfa antibiotics, Atorvastatin, and Epinephrine  ? ?Social History:  The patient  reports that she has never smoked. She has never used smokeless tobacco. She reports that she does not drink alcohol and does not use drugs.  ? ?Family History:  The patient's family history includes Diabetes in her father and mother; Hypertension in her father and mother; Kidney disease in her mother; Migraines in her maternal grandmother, son, and son.  ? ?ROS:  Please see the history of present illness.   Otherwise, review of systems is positive for none.   All other systems are reviewed and negative.  ? ?PHYSICAL EXAM: ?VS:  There were no vitals taken for this visit. , BMI There is no height or weight on file to calculate BMI. ?GEN: Well nourished, well developed, in no acute distress  ?HEENT: normal  ?Neck: no JVD, carotid bruits, or masses ?Cardiac: RRR; no murmurs, rubs, or gallops,no edema  ?Respiratory:  clear to auscultation bilaterally, normal work of breathing ?GI: soft, nontender, nondistended, + BS ?MS: no deformity or atrophy  ?Skin: warm and dry ?Neuro:  Strength and sensation are intact ?Psych: euthymic mood, full affect ? ?Recent Labs: ?02/22/2021: ALT 101; Magnesium 1.9; TSH 9.468 ?06/30/2021: BUN 10; Creatinine, Ser 0.89; Hemoglobin 13.5; Platelets 233; Potassium  4.4; Sodium 143  ? ? ?Lipid Panel  ?   ?Component Value Date/Time  ? CHOL  11/05/2007 0821  ?  136        ?ATP III CLASSIFICATION: ? <200     mg/dL   Desirable ? 200-239  mg/dL   Borderline High ? >=240    mg/dL   High  ? TRIG 145 11/05/2007 0821  ? HDL 36 (L)  11/05/2007 6767  ? CHOLHDL 3.8 11/05/2007 0821  ? VLDL 29 11/05/2007 0821  ? [place]  11/05/2007 0821  ?  71        ?Total Cholesterol/HDL:CHD Risk ?Coronary Heart Disease Risk Table ?                    Men   Women ? 1/2 Average Risk   3.4   3.3  ? ? ? ?Wt Readings from Last 3 Encounters:  ?04/05/21 79.4 kg  ?03/17/21 80.5 kg  ?02/22/21 79.4 kg  ?  ? ? ?Other studies Reviewed: ?Additional studies/ records that were reviewed today include: Monitor 10/08/19  ?Review of the above records today demonstrates:  ?The dominant rhythm is normal sinus with normal circadian variation. ?There are rare episodes of very brief nonsustained ventricular tachycardia, 3-5 beats. ?There are rare episodes of brief nonsustained atrial tachycardia, 3-7 beats. ?The patient triggered recordings are almost all associated with abnormal rhythm, usually isolated PACs and PVCs ?No significant episodes of bradycardia or pauses are recorded. ?There is no atrial fibrillation. ? ? ? ?ASSESSMENT AND PLAN: ? ?1.  Paroxysmal atrial fibrillation/atrial tachycardia: Amanda Frost has presented today for surgery, with the diagnosis of AF.  The various methods of treatment have been discussed with the patient and family. After consideration of risks, benefits and other options for treatment, the patient has consented to  Procedure(s): ?Catheter ablation as a surgical intervention .  Risks include but not limited to complete heart block, stroke, esophageal damage, nerve damage, bleeding, vascular damage, tamponade, perforation, MI, and death. The patient's history has been reviewed, patient examined, no change in status, stable for surgery.  I have reviewed the patient's chart and labs.  Questions were answered to the patient's satisfaction.   ? ?Amanda Lai, MD ?07/12/2021 ?7:11 AM  ? ?

## 2021-07-12 NOTE — Anesthesia Procedure Notes (Signed)
Procedure Name: Intubation ?Date/Time: 07/12/2021 8:47 AM ?Performed by: Dorthea Cove, CRNA ?Pre-anesthesia Checklist: Patient identified, Emergency Drugs available, Suction available and Patient being monitored ?Patient Re-evaluated:Patient Re-evaluated prior to induction ?Oxygen Delivery Method: Circle system utilized ?Preoxygenation: Pre-oxygenation with 100% oxygen ?Induction Type: IV induction ?Ventilation: Mask ventilation without difficulty ?Laryngoscope Size: Glidescope and 3 ?Grade View: Grade I ?Tube type: Oral ?Tube size: 7.0 mm ?Number of attempts: 1 ?Airway Equipment and Method: Stylet and Oral airway ?Placement Confirmation: ETT inserted through vocal cords under direct vision, positive ETCO2 and breath sounds checked- equal and bilateral ?Secured at: 21 cm ?Tube secured with: Tape ?Dental Injury: Teeth and Oropharynx as per pre-operative assessment  ? ? ? ? ?

## 2021-07-13 ENCOUNTER — Encounter (HOSPITAL_COMMUNITY): Payer: Self-pay | Admitting: Cardiology

## 2021-07-13 ENCOUNTER — Telehealth: Payer: Self-pay | Admitting: Cardiology

## 2021-07-13 NOTE — Telephone Encounter (Signed)
New Message: ? ? ? ?Patient said she had an Ablation yesterday and is having some problems. She says she is having terrible muscle cramps in her neck, going all the way up the back of her head. She says she can not sleep or rest. This was going on yesterday and the day before. She said yesterday they gave her something in her IV for this.  She would like for Dr Curt Bears to prescribe something for this please. She says she is also having a tightness and soreness in her chest. ? ? ?Pt c/o of Chest Pain: STAT if CP now or developed within 24 hours ? ?1. Are you having CP right now? Tightness and soreness in her chest ? ?2. Are you experiencing any other symptoms (ex. SOB, nausea, vomiting, sweating)? Hard to get a deep breath ? ?3. How long have you been experiencing CP? Last night after her Ablation yesterday ? ?4. Is your CP continuous or coming and going? continuous ? ?5. Have you taken Nitroglycerin? no ?? ? ?

## 2021-07-13 NOTE — Telephone Encounter (Signed)
Pt aware of recommendations ./cy 

## 2021-07-13 NOTE — Telephone Encounter (Signed)
Spoke with pt and is continuing to have "neck spasms" radiating to back of head Per pt unable to sleep and cannot move neck Pt is wanting to know what can take for pain Per pt is using ice and alternating with heat and is not helping with pain Per pt ES tyl does not touch pain either . Pt thought anesthesiologist had given her something yesterday during procedure and that helped Pt also notes chest being sore  and is unable to take a deep breath  this started last night Will forward to Dr Curt Bears for review and recommendations ./cy ?

## 2021-07-14 ENCOUNTER — Emergency Department (HOSPITAL_COMMUNITY): Payer: PPO

## 2021-07-14 ENCOUNTER — Encounter (HOSPITAL_COMMUNITY): Payer: Self-pay | Admitting: Pharmacy Technician

## 2021-07-14 ENCOUNTER — Emergency Department (HOSPITAL_COMMUNITY)
Admission: EM | Admit: 2021-07-14 | Discharge: 2021-07-14 | Disposition: A | Discharge status: Home / Self Care | Payer: PPO | Attending: Emergency Medicine | Admitting: Emergency Medicine

## 2021-07-14 ENCOUNTER — Other Ambulatory Visit: Payer: Self-pay

## 2021-07-14 DIAGNOSIS — R778 Other specified abnormalities of plasma proteins: Secondary | ICD-10-CM | POA: Diagnosis not present

## 2021-07-14 DIAGNOSIS — R29818 Other symptoms and signs involving the nervous system: Secondary | ICD-10-CM | POA: Diagnosis not present

## 2021-07-14 DIAGNOSIS — Z79899 Other long term (current) drug therapy: Secondary | ICD-10-CM | POA: Diagnosis not present

## 2021-07-14 DIAGNOSIS — R079 Chest pain, unspecified: Secondary | ICD-10-CM | POA: Insufficient documentation

## 2021-07-14 DIAGNOSIS — R519 Headache, unspecified: Secondary | ICD-10-CM | POA: Diagnosis not present

## 2021-07-14 DIAGNOSIS — G43909 Migraine, unspecified, not intractable, without status migrainosus: Secondary | ICD-10-CM | POA: Diagnosis not present

## 2021-07-14 DIAGNOSIS — R000 Tachycardia, unspecified: Secondary | ICD-10-CM | POA: Diagnosis not present

## 2021-07-14 DIAGNOSIS — M542 Cervicalgia: Secondary | ICD-10-CM | POA: Insufficient documentation

## 2021-07-14 LAB — CBC
HCT: 44.5 % (ref 36.0–46.0)
Hemoglobin: 14.1 g/dL (ref 12.0–15.0)
MCH: 30.1 pg (ref 26.0–34.0)
MCHC: 31.7 g/dL (ref 30.0–36.0)
MCV: 94.9 fL (ref 80.0–100.0)
Platelets: 264 10*3/uL (ref 150–400)
RBC: 4.69 MIL/uL (ref 3.87–5.11)
RDW: 13.6 % (ref 11.5–15.5)
WBC: 11.3 10*3/uL — ABNORMAL HIGH (ref 4.0–10.5)
nRBC: 0 % (ref 0.0–0.2)

## 2021-07-14 LAB — I-STAT CHEM 8, ED
BUN: 9 mg/dL (ref 8–23)
Calcium, Ion: 1.13 mmol/L — ABNORMAL LOW (ref 1.15–1.40)
Chloride: 102 mmol/L (ref 98–111)
Creatinine, Ser: 0.7 mg/dL (ref 0.44–1.00)
Glucose, Bld: 131 mg/dL — ABNORMAL HIGH (ref 70–99)
HCT: 45 % (ref 36.0–46.0)
Hemoglobin: 15.3 g/dL — ABNORMAL HIGH (ref 12.0–15.0)
Potassium: 3.9 mmol/L (ref 3.5–5.1)
Sodium: 138 mmol/L (ref 135–145)
TCO2: 25 mmol/L (ref 22–32)

## 2021-07-14 LAB — DIFFERENTIAL
Abs Immature Granulocytes: 0.04 10*3/uL (ref 0.00–0.07)
Basophils Absolute: 0.1 10*3/uL (ref 0.0–0.1)
Basophils Relative: 1 %
Eosinophils Absolute: 0.2 10*3/uL (ref 0.0–0.5)
Eosinophils Relative: 2 %
Immature Granulocytes: 0 %
Lymphocytes Relative: 24 %
Lymphs Abs: 2.7 10*3/uL (ref 0.7–4.0)
Monocytes Absolute: 1.1 10*3/uL — ABNORMAL HIGH (ref 0.1–1.0)
Monocytes Relative: 9 %
Neutro Abs: 7.3 10*3/uL (ref 1.7–7.7)
Neutrophils Relative %: 64 %

## 2021-07-14 LAB — COMPREHENSIVE METABOLIC PANEL
ALT: 37 U/L (ref 0–44)
AST: 38 U/L (ref 15–41)
Albumin: 4.2 g/dL (ref 3.5–5.0)
Alkaline Phosphatase: 127 U/L — ABNORMAL HIGH (ref 38–126)
Anion gap: 11 (ref 5–15)
BUN: 9 mg/dL (ref 8–23)
CO2: 25 mmol/L (ref 22–32)
Calcium: 9.8 mg/dL (ref 8.9–10.3)
Chloride: 103 mmol/L (ref 98–111)
Creatinine, Ser: 0.81 mg/dL (ref 0.44–1.00)
GFR, Estimated: >60 mL/min (ref >60)
Glucose, Bld: 133 mg/dL — ABNORMAL HIGH (ref 70–99)
Potassium: 4 mmol/L (ref 3.5–5.1)
Sodium: 139 mmol/L (ref 135–145)
Total Bilirubin: 1 mg/dL (ref 0.3–1.2)
Total Protein: 7.7 g/dL (ref 6.5–8.1)

## 2021-07-14 LAB — CBG MONITORING, ED: Glucose-Capillary: 146 mg/dL — ABNORMAL HIGH (ref 70–99)

## 2021-07-14 LAB — TROPONIN I (HIGH SENSITIVITY)
Troponin I (High Sensitivity): 229 ng/L (ref <18)
Troponin I (High Sensitivity): 216 ng/L (ref <18)

## 2021-07-14 LAB — PROTIME-INR
INR: 1.1 (ref 0.8–1.2)
Prothrombin Time: 14.2 seconds (ref 11.4–15.2)

## 2021-07-14 LAB — APTT: aPTT: 29 seconds (ref 24–36)

## 2021-07-14 MED ORDER — SODIUM CHLORIDE 0.9% FLUSH: 3.0000 mL | Freq: Once | INTRAVENOUS | Status: DC

## 2021-07-14 MED ORDER — METHOCARBAMOL 1000 MG/10ML IJ SOLN
1000.0000 mg | Freq: Once | INTRAVENOUS | Status: AC
  Administered 2021-07-14: 1000 mg via INTRAVENOUS
  Filled 2021-07-14: qty 10

## 2021-07-14 MED ORDER — METHOCARBAMOL 1000 MG/10ML IJ SOLN
1000.0000 mg | Freq: Once | INTRAMUSCULAR | Status: DC
  Filled 2021-07-14: qty 10

## 2021-07-14 MED ORDER — IOHEXOL 350 MG/ML SOLN
75.0000 mL | Freq: Once | INTRAVENOUS | Status: AC | PRN
  Administered 2021-07-14: 75 mL via INTRAVENOUS

## 2021-07-14 NOTE — ED Provider Notes (Signed)
?[facility] ?Provider Note ? ? ?CSN: [id number] ?Arrival date & time: 07/14/21  1553 ? ?  ? ?History ? ?Chief Complaint  ?Patient presents with  ? Neck Pain  ? Migraine  ? ? ?Amanda Frost is a 71 y.o. female.  Presented to the emergency department with concern for neck spasm.  Patient reports that over the last week she has been having intermittent neck spasms.  Today feels like its worse, states that she had some transient visual disturbances.  The vision changes were similar to prior ocular migraines.  They lasted for a few minutes and then resolved.  Currently does not have any visual disturbance.  States that the pain is moderate to severe, primarily in her left side of her neck.  Patient had cardiac ablation on Tuesday for A-fib.  Yesterday she had a mild episode of chest discomfort.  Occurring at rest, not with exertion.  No chest pain today.  She does not feel short of breath. ? ?HPI ? ?  ? ?Home Medications ?Prior to Admission medications   ?Medication Sig Start Date End Date Taking? Authorizing Provider  ?adapalene (DIFFERIN) 0.1 % cream Apply 1 application. topically daily as needed (acne).   Yes [provider]  ?apixaban (ELIQUIS) 5 MG TABS tablet Take 1 tablet (5 mg total) by mouth 2 (two) times daily. 10/25/20  Yes Croitoru, Mihai, MD  ?Cholecalciferol (VITAMIN D3) 5000 units CAPS Take 10,000 Units by mouth every Monday, Tuesday, Wednesday, Thursday, and Friday.   Yes [provider]  ?metoprolol succinate (TOPROL-XL) 50 MG 24 hr tablet Take with or immediately following a meal. ?Patient taking differently: Take 25-50 mg by mouth See admin instructions. Take 50 mg in the morning and 25 mg in the evening per patient. Patient states she splits in half 05/18/21  Yes Croitoru, Mihai, MD  ?metoprolol tartrate (LOPRESSOR) 25 MG tablet TAKE ONE TABLET DAILY AS NEEDED FOR PALPITATIONS. ?Patient not taking: Reported on 07/14/2021 06/16/21   Croitoru,  Dani Gobble, MD  ?   ? ?Allergies    ?Ciprofloxacin, Macrodantin [nitrofurantoin macrocrystal], Sulfa antibiotics, Atorvastatin, and Epinephrine   ? ?Review of Systems   ?Review of Systems  ?Constitutional:  Negative for chills and fever.  ?HENT:  Negative for ear pain and sore throat.   ?Eyes:  Negative for pain and visual disturbance.  ?Respiratory:  Negative for cough and shortness of breath.   ?Cardiovascular:  Positive for chest pain. Negative for palpitations.  ?Gastrointestinal:  Negative for abdominal pain and vomiting.  ?Genitourinary:  Negative for dysuria and hematuria.  ?Musculoskeletal:  Positive for neck pain. Negative for arthralgias and back pain.  ?Skin:  Negative for color change and rash.  ?Neurological:  Negative for seizures and syncope.  ?All other systems reviewed and are negative. ? ?Physical Exam ?Updated Vital Signs ?BP (!) 150/91   Pulse 97   Temp 98.3 ?F (36.8 ?C) (Oral)   Resp 20   SpO2 92%  ?Physical Exam ?Vitals and nursing note reviewed.  ?Constitutional:   ?   General: She is not in acute distress. ?   Appearance: She is well-developed.  ?HENT:  ?   Head: Normocephalic and atraumatic.  ?Eyes:  ?   Conjunctiva/sclera: Conjunctivae normal.  ?Cardiovascular:  ?   Rate and Rhythm: Normal rate and regular rhythm.  ?   Heart sounds: No murmur heard. ?Pulmonary:  ?   Effort: Pulmonary effort is normal. No respiratory distress.  ?   Breath sounds: Normal  breath sounds.  ?Abdominal:  ?   Palpations: Abdomen is soft.  ?   Tenderness: There is no abdominal tenderness.  ?Musculoskeletal:     ?   General: No swelling.  ?   Cervical back: Neck supple.  ?   Comments: Some tenderness to left lateral neck  ?Skin: ?   General: Skin is warm and dry.  ?   Capillary Refill: Capillary refill takes less than 2 seconds.  ?Neurological:  ?   Mental Status: She is alert.  ?Psychiatric:     ?   Mood and Affect: Mood normal.  ? ? ?ED Results / Procedures / Treatments   ?Labs ?(all labs ordered are listed, but  only abnormal results are displayed) ?Labs Reviewed  ?CBC - Abnormal; Notable for the following components:  ?    Result Value  ? WBC 11.3 (*)   ? All other components within normal limits  ?DIFFERENTIAL - Abnormal; Notable for the following components:  ? Monocytes Absolute 1.1 (*)   ? All other components within normal limits  ?COMPREHENSIVE METABOLIC PANEL - Abnormal; Notable for the following components:  ? Glucose, Bld 133 (*)   ? Alkaline Phosphatase 127 (*)   ? All other components within normal limits  ?I-STAT CHEM 8, ED - Abnormal; Notable for the following components:  ? Glucose, Bld 131 (*)   ? Calcium, Ion 1.13 (*)   ? Hemoglobin 15.3 (*)   ? All other components within normal limits  ?CBG MONITORING, ED - Abnormal; Notable for the following components:  ? Glucose-Capillary 146 (*)   ? All other components within normal limits  ?TROPONIN I (HIGH SENSITIVITY) - Abnormal; Notable for the following components:  ? Troponin I (High Sensitivity) 229 (*)   ? All other components within normal limits  ?TROPONIN I (HIGH SENSITIVITY) - Abnormal; Notable for the following components:  ? Troponin I (High Sensitivity) 216 (*)   ? All other components within normal limits  ?PROTIME-INR  ?APTT  ? ? ?EKG ?EKG Interpretation ? ?Date/Time:  Friday July 14 2021 17:19:44 EDT ?Ventricular Rate:  105 ?PR Interval:  152 ?QRS Duration: 72 ?QT Interval:  338 ?QTC Calculation: 446 ?R Axis:   15 ?Text Interpretation: Sinus tachycardia Minimal voltage criteria for LVH, may be normal variant ( R in aVL ) Borderline ECG When compared with ECG of 12-Jul-2021 10:55,  no significant? change Confirmed by Madalyn Rob [phone]) on 07/14/2021 7:24:39 PM ? ?Radiology ?CT ANGIO HEAD NECK W WO CM ? ?Result Date: 07/14/2021 ?CLINICAL DATA:  Initial evaluation for neuro deficit, stroke suspected. EXAM: CT ANGIOGRAPHY HEAD AND NECK TECHNIQUE: Multidetector CT imaging of the head and neck was performed using the standard protocol during bolus  administration of intravenous contrast. Multiplanar CT image reconstructions and MIPs were obtained to evaluate the vascular anatomy. Carotid stenosis measurements (when applicable) are obtained utilizing NASCET criteria, using the distal internal carotid diameter as the denominator. RADIATION DOSE REDUCTION: This exam was performed according to the departmental dose-optimization program which includes automated exposure control, adjustment of the mA and/or kV according to patient size and/or use of iterative reconstruction technique. CONTRAST:  3m OMNIPAQUE IOHEXOL 350 MG/ML SOLN COMPARISON:  Prior head CT from earlier the same day. FINDINGS: CTA NECK FINDINGS Aortic arch: Visualized aortic arch normal in caliber with normal branch pattern. No stenosis about the origin of the great vessels. Right carotid system: Right common and internal carotid arteries tortuous but widely patent without stenosis or dissection. Left carotid system:  Left common and internal carotid arteries tortuous but widely patent without stenosis or dissection. Vertebral arteries: Both vertebral arteries arise from subclavian arteries. No significant proximal subclavian artery stenosis. Vertebral arteries tortuous but widely patent without stenosis or dissection. Skeleton: No discrete or worrisome osseous lesions. Mild spondylosis present at C6-7. Other neck: No other acute soft tissue abnormality within the neck. Upper chest: 8 mm nodular density partially visualized along the left major fissure, incompletely visualized on this exam and indeterminate. Review of the MIP images confirms the above findings CTA HEAD FINDINGS Anterior circulation: Petrous segments patent bilaterally. Minor atheromatous change within the left carotid siphon without stenosis. ICAs otherwise widely patent to the termini. A1 segments, anterior communicating complex common anterior cerebral arteries widely patent. Normal in stenosis or occlusion. Normal MCA  bifurcations. Distal MCA branches perfused and symmetric. Posterior circulation: Both V4 segments widely patent to the vertebrobasilar junction. Right PICA patent. Left PICA not well seen. Basilar patent to its distal asp

## 2021-07-14 NOTE — ED Triage Notes (Signed)
Pt here with reports of neck spasms for the last week. Pt states having vision issues. Hx ocular migraines. Pt with no facial droop, no unilateral weakness.  ?

## 2021-07-14 NOTE — ED Provider Triage Note (Signed)
Emergency Medicine Provider Triage Evaluation Note ? ?Amanda Frost , a 71 y.o. female  was evaluated in triage.  Pt complains of neck spasms and visual disturbances.  She has been having neck spasms for the last week or so.  She did have a cardiac ablation on Tuesday, and they did not believe the spasms are related to the procedure.  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  She is concerned with the combination of both symptoms, and with the visual symptoms lasting longer than they normally do, for some other etiology. ? ?Review of Systems  ?Positive: Headache, visual disturbance, neck spasm ?Negative: Chest pain, shortness of breath, speech difficulty, weakness ? ?Physical Exam  ?BP (!) 201/113   Pulse (!) 105   Resp 18   SpO2 95%  ?Gen:   Awake, no distress   ?Resp:  Normal effort  ?MSK:   Moves extremities without difficulty  ?Other:  No facial droop, speech is clear, no unilateral weakness ? ?Medical Decision Making  ?Medically screening exam initiated at 5:02 PM.  Appropriate orders placed.  Unique T Bouyer was informed that the remainder of the evaluation will be completed by another provider, this initial triage assessment does not replace that evaluation, and the importance of remaining in the ED until their evaluation is complete. ? ?Patient noted be hypertensive in triage. She reports taking her BP meds today. Will obtain stroke/TIA workup. Does not meet criteria for code stroke at this time.  ?  Kateri Plummer, PA-C ?07/14/21 1703 ? ?

## 2021-07-14 NOTE — Discharge Instructions (Signed)
Please follow-up with your cardiology team as well as your primary care doctor.  Recommend taking the previously prescribed Robaxin as needed for your neck spasm/neck pain.  Please discuss the possible pulmonary nodule with your primary care doctor.  You may need additional CT scan of your chest for further monitoring. ? ?If you develop any chest pain, difficulty in breathing or other new concerning symptom, come back to ER for reassessment. ?

## 2021-07-14 NOTE — ED Notes (Signed)
Pt verbalized understanding of d/c instructions, meds, and followup care. Denies questions. VSS, no distress noted. Steady gait to exit with all belongings.  ?

## 2021-07-17 ENCOUNTER — Telehealth: Payer: Self-pay | Admitting: Cardiology

## 2021-07-17 NOTE — Telephone Encounter (Signed)
Pt c/o medication issue: ? ?1. Name of Medication: metoprolol succinate (TOPROL-XL) 50 MG 24 hr tablet ? ?2. How are you currently taking this medication (dosage and times per day)? Take with or immediately following a meal.Patient taking differently: Take 25-50 mg by mouth See admin instructions. Take 50 mg in the morning and 25 mg in the evening per patient. Patient states she splits in half ? ?3. Are you having a reaction (difficulty breathing--STAT)? no ? ?4. What is your medication issue?   ?Patient had procedure on last Wednesday and her HR remains in 80s/90s. She been giving  metoprolol succinate (TOPROL-XL) 50 MG 24 hr tablet calling to see if she needs to take this or not. Patient is not sure if she needs to take it or not. Please advise  ?

## 2021-07-17 NOTE — Telephone Encounter (Signed)
Followed up with pt. ?She can feel HR going faster, avg 80-90s at home recently. ?Reports she took Toprol 50 mg BID yesterday.  She has NOT taken PRN Lopressor. ?Advised to take PRN Lopressor for HRs greater than 90-100 and/or symptomatic w/ HRs in the 80s. ?Pt understands possible breakthrough AFib. ?Aware MD may want to increase Toprol to 50 mg BID (takes 50 in am, 25 in pm currently) ?Pt understands I will forward this to MD for review/advisement.  Aware it may be several days before return call. ?Patient verbalized understanding and agreeable to plan.  ? ?

## 2021-07-18 DIAGNOSIS — G43B0 Ophthalmoplegic migraine, not intractable: Secondary | ICD-10-CM | POA: Diagnosis not present

## 2021-07-19 NOTE — Telephone Encounter (Signed)
Patient paged after hour answering service complaining of elevated heart rate in the 80s and 90s whereas her previous heart rate has been lower than this.  She is currently on 50 mg twice a day of metoprolol succinate.  She is scheduled to see atrial fibrillation clinic in 1 month.  I reassured her that her current heart rate is not dangerous.  However in order to to know why her heart rate is still near the upper border normal, she really need a EKG. ? ?I recommend move up her next A-fib clinic visit to a earlier time.  Please call the patient to arrange for earlier visit. ?

## 2021-07-24 ENCOUNTER — Other Ambulatory Visit: Payer: PPO

## 2021-07-26 ENCOUNTER — Encounter (HOSPITAL_COMMUNITY): Payer: Self-pay | Admitting: Nurse Practitioner

## 2021-07-26 ENCOUNTER — Ambulatory Visit (HOSPITAL_COMMUNITY)
Admission: RE | Admit: 2021-07-26 | Discharge: 2021-07-26 | Disposition: A | Discharge status: Home / Self Care | Payer: PPO | Source: Ambulatory Visit | Attending: Nurse Practitioner | Admitting: Nurse Practitioner

## 2021-07-26 VITALS — BP 160/110 | HR 90 | Ht 68.0 in | Wt 177.0 lb

## 2021-07-26 DIAGNOSIS — I480 Paroxysmal atrial fibrillation: Secondary | ICD-10-CM | POA: Insufficient documentation

## 2021-07-26 DIAGNOSIS — I10 Essential (primary) hypertension: Secondary | ICD-10-CM | POA: Insufficient documentation

## 2021-07-26 DIAGNOSIS — Z79899 Other long term (current) drug therapy: Secondary | ICD-10-CM | POA: Insufficient documentation

## 2021-07-26 DIAGNOSIS — D6869 Other thrombophilia: Secondary | ICD-10-CM | POA: Diagnosis not present

## 2021-07-26 MED ORDER — METOPROLOL SUCCINATE ER 50 MG PO TB24: ORAL_TABLET | ORAL | Status: DC

## 2021-07-26 NOTE — Progress Notes (Signed)
? ?Primary Care Physician: Crist Infante, MD ?Referring Physician: Dr. Curt Bears  ? ? ?Amanda Frost is a 71 y.o. female with a h/o HTN, PAF, that recently underwent afib ablation 07/12/21. She asked to be seen today as she has noted increased heart rate since the ablation. BB recently increased. She is in SR and has not noted any afib.  ? ?No swallowing or groin issues since the procedure. She has noted some elevation of BP since procedure. She is watching this at home. She returned to work this week. She has noted more energy since the procedure.  ? ?Today, she denies symptoms of palpitations, chest pain, shortness of breath, orthopnea, PND, lower extremity edema, dizziness, presyncope, syncope, or neurologic sequela. The patient is tolerating medications without difficulties and is otherwise without complaint today.  ? ?Past Medical History:  ?Diagnosis Date  ? Hypertension   ? Liver cirrhosis secondary to NASH (nonalcoholic steatohepatitis) ([place])   ? Ocular migraine   ? Palpitations   ? PAC's,PVC's,atrial tachycardia  ? Paroxysmal atrial fibrillation ([facility])   ? ?Past Surgical History:  ?Procedure Laterality Date  ? ATRIAL FIBRILLATION ABLATION N/A 07/12/2021  ? Procedure: ATRIAL FIBRILLATION ABLATION;  Surgeon: Constance Haw, MD;  Location: [facility];  Service: Cardiovascular;  Laterality: N/A;  ? BREAST BIOPSY  1994  ? CARDIAC CATHETERIZATION  11/05/2007  ? normal LV fx,patent coronary arteries  ? NM MYOVIEW LTD  03/12/2005  ? normal  ? US ECHOCARDIOGRAPHY  11/26/2007  ? mild LVH,trace MR,TR  ? ? ?Current Outpatient Medications  ?Medication Sig Dispense Refill  ? adapalene (DIFFERIN) 0.1 % cream Apply 1 application. topically daily as needed (acne).    ? apixaban (ELIQUIS) 5 MG TABS tablet Take 1 tablet (5 mg total) by mouth 2 (two) times daily. 180 tablet 1  ? Cholecalciferol (VITAMIN D3) 5000 units CAPS Take 10,000 Units by mouth every Monday, Tuesday, Wednesday, Thursday, and Friday.    ?  metoprolol succinate (TOPROL-XL) 50 MG 24 hr tablet Taking 1 tablet by mouth twice daily    ? metoprolol tartrate (LOPRESSOR) 25 MG tablet TAKE ONE TABLET DAILY AS NEEDED FOR PALPITATIONS. (Patient not taking: Reported on 07/14/2021) 30 tablet 3  ? ?No current facility-administered medications for this encounter.  ? ? ?Allergies  ?Allergen Reactions  ? Ciprofloxacin Palpitations and Other (See Comments)  ?  AFIB ? ?  ? Macrodantin [Nitrofurantoin Macrocrystal] Hives  ? Sulfa Antibiotics Hives  ? Atorvastatin   ?  joints hurt too much  ? Epinephrine   ?  Heart races  ? ? ?Social History  ? ?Socioeconomic History  ? Marital status: Married  ?  Spouse name: Not on file  ? Number of children: 4  ? Years of education: Not on file  ? Highest education level: Some college, no degree  ?Occupational History  ? Not on file  ?Tobacco Use  ? Smoking status: Never  ? Smokeless tobacco: Never  ?Vaping Use  ? Vaping Use: Never used  ?Substance and Sexual Activity  ? Alcohol use: No  ?  Alcohol/week: 0.0 standard drinks  ? Drug use: No  ? Sexual activity: Yes  ?Other Topics Concern  ? Not on file  ?Social History Narrative  ? Pt lives in [place] with spouse and 15 yr old granddaughter.  Retired Control and instrumentation engineer (kindergarten)  ? Right handed  ? Caffeine: tea, 1 cup/day  ? ?Social Determinants of Health  ? ?Financial Resource Strain: Not on file  ?Food Insecurity:  Not on file  ?Transportation Needs: Not on file  ?Physical Activity: Not on file  ?Stress: Not on file  ?Social Connections: Not on file  ?Intimate Partner Violence: Not on file  ? ? ?Family History  ?Problem Relation Age of Onset  ? Diabetes Mother   ? Hypertension Mother   ? Kidney disease Mother   ? Diabetes Father   ? Hypertension Father   ? Migraines Maternal Grandmother   ? Migraines Son   ?     childhood  ? Migraines Son   ?     childhood  ? ? ?ROS- All systems are reviewed and negative except as per the HPI above ? ?Physical Exam: ?Vitals:  ? 07/26/21 0826   ?Pulse: 90  ?Weight: 80.3 kg  ?Height: '5\' 8"'$  (1.727 m)  ? ?Wt Readings from Last 3 Encounters:  ?07/26/21 80.3 kg  ?07/12/21 78.5 kg  ?04/05/21 79.4 kg  ? ? ?Labs: ?Lab Results  ?Component Value Date  ? NA 138 07/14/2021  ? K 3.9 07/14/2021  ? CL 102 07/14/2021  ? CO2 25 07/14/2021  ? GLUCOSE 131 (H) 07/14/2021  ? BUN 9 07/14/2021  ? CREATININE 0.70 07/14/2021  ? CALCIUM 9.8 07/14/2021  ? MG 1.9 02/22/2021  ? ?Lab Results  ?Component Value Date  ? INR 1.1 07/14/2021  ? ?Lab Results  ?Component Value Date  ? CHOL  11/05/2007  ?  136        ?ATP III CLASSIFICATION: ? <200     mg/dL   Desirable ? 200-239  mg/dL   Borderline High ? >=240    mg/dL   High  ? HDL 36 (L) 11/05/2007  ? [place]  11/05/2007  ?  71        ?Total Cholesterol/HDL:CHD Risk ?Coronary Heart Disease Risk Table ?                    Men   Women ? 1/2 Average Risk   3.4   3.3  ? TRIG 145 11/05/2007  ? ? ? ?GEN- The patient is well appearing, alert and oriented x 3 today.   ?Head- normocephalic, atraumatic ?Eyes-  Sclera clear, conjunctiva pink ?Ears- hearing intact ?Oropharynx- clear ?Neck- supple, no JVP ?Lymph- no cervical lymphadenopathy ?Lungs- Clear to ausculation bilaterally, normal work of breathing ?Heart- Regular rate and rhythm, no murmurs, rubs or gallops, PMI not laterally displaced ?GI- soft, NT, ND, + BS ?Extremities- no clubbing, cyanosis, or edema ?MS- no significant deformity or atrophy ?Skin- no rash or lesion ?Psych- euthymic mood, full affect ?Neuro- strength and sensation are intact ? ?EKG-Vent. rate 90 BPM ?PR interval 152 ms ?QRS duration 80 ms ?QT/QTcB 354/433 ms ?P-R-T axes 33 19 47 ?Normal sinus rhythm ?Normal ECG ?When compared with ECG of 14-Jul-2021 17:19, ?PREVIOUS ECG IS PRESENT ? ? ? ?Assessment and Plan:  ?1. Afib ?S/p ablation 07/12/21 ?In SR  ?Doing well since the procedure ?Has noted increased HR's from pre procedure but less than 100 bpm  ?Recent increase in BB to Toprol 50 mg bid 3 days ago ?Pt reassured and was  informed that small studies have shown increased HR after procedure has been associated with a favorable outcome  ? ?2. HTN ?Elevated since procedure ?Recent increase in BB ?She will continue to monitor at home ?If stays elevated above 491 systolic, call office and can try low dose ARB  ? ?3. CHA2DS2VASc  score of 4 ?Continue eliquis 5 mg bid without interruption for the  3 month recovery period   ? ?For now will keep the May 17 th appointment with me, especially if I have to start additional BP meds, if doing ok at the time of the appointment, she can cancel ?F/u with Dr. Curt Bears 3 months after procedure ? ?Geroge Baseman Kayleen Memos, ANP-C ?Afib Clinic ?[facility] ?[address] ?[place], [place] [address] ?[phone]  ? ?

## 2021-08-02 ENCOUNTER — Encounter (HOSPITAL_COMMUNITY): Payer: Self-pay | Admitting: Nurse Practitioner

## 2021-08-02 ENCOUNTER — Ambulatory Visit (HOSPITAL_COMMUNITY)
Admission: RE | Admit: 2021-08-02 | Discharge: 2021-08-02 | Disposition: A | Discharge status: Home / Self Care | Payer: PPO | Source: Ambulatory Visit | Attending: Nurse Practitioner | Admitting: Nurse Practitioner

## 2021-08-02 VITALS — BP 164/98 | HR 94 | Ht 68.0 in | Wt 174.6 lb

## 2021-08-02 DIAGNOSIS — R002 Palpitations: Secondary | ICD-10-CM

## 2021-08-02 DIAGNOSIS — I10 Essential (primary) hypertension: Secondary | ICD-10-CM | POA: Diagnosis not present

## 2021-08-02 DIAGNOSIS — Z7901 Long term (current) use of anticoagulants: Secondary | ICD-10-CM | POA: Insufficient documentation

## 2021-08-02 DIAGNOSIS — Z79899 Other long term (current) drug therapy: Secondary | ICD-10-CM | POA: Insufficient documentation

## 2021-08-02 DIAGNOSIS — I480 Paroxysmal atrial fibrillation: Secondary | ICD-10-CM | POA: Diagnosis not present

## 2021-08-02 DIAGNOSIS — D6869 Other thrombophilia: Secondary | ICD-10-CM

## 2021-08-02 DIAGNOSIS — Z09 Encounter for follow-up examination after completed treatment for conditions other than malignant neoplasm: Secondary | ICD-10-CM | POA: Insufficient documentation

## 2021-08-02 LAB — TSH: TSH: 2.223 u[IU]/mL (ref 0.350–4.500)

## 2021-08-02 MED ORDER — METOPROLOL SUCCINATE ER 50 MG PO TB24: ORAL_TABLET | ORAL | 3 refills | Status: DC

## 2021-08-02 NOTE — Progress Notes (Signed)
? ?Primary Care Physician: Crist Infante, MD ?Referring Physician: Dr. Curt Bears  ? ? ?Amanda Frost is a 71 y.o. female with a h/o HTN, PAF, that recently underwent afib ablation 07/12/21. She asked to be seen today as she has noted increased heart rate since the ablation. BB recently increased. She is in SR and has not noted any afib.  ? ?No swallowing or groin issues since the procedure. She has noted some elevation of BP since procedure. She is watching this at home. She returned to work this week. She has noted more energy since the procedure.  ? ?F/u afib clinic5/3/23,  as pt wanted to be seen today for noticing some palpitations since yesterday. EKG shows SR. By her description sounds like palpitations. She has had a lot of family stress. TSH noted to be elevated 5 months ago, will recheck. ? ?Today, she denies symptoms of palpitations, chest pain, shortness of breath, orthopnea, PND, lower extremity edema, dizziness, presyncope, syncope, or neurologic sequela. The patient is tolerating medications without difficulties and is otherwise without complaint today.  ? ?Past Medical History:  ?Diagnosis Date  ? Hypertension   ? Liver cirrhosis secondary to NASH (nonalcoholic steatohepatitis) ([place])   ? Ocular migraine   ? Palpitations   ? PAC's,PVC's,atrial tachycardia  ? Paroxysmal atrial fibrillation ([facility])   ? ?Past Surgical History:  ?Procedure Laterality Date  ? ATRIAL FIBRILLATION ABLATION N/A 07/12/2021  ? Procedure: ATRIAL FIBRILLATION ABLATION;  Surgeon: Constance Haw, MD;  Location: [facility];  Service: Cardiovascular;  Laterality: N/A;  ? BREAST BIOPSY  1994  ? CARDIAC CATHETERIZATION  11/05/2007  ? normal LV fx,patent coronary arteries  ? NM MYOVIEW LTD  03/12/2005  ? normal  ? US ECHOCARDIOGRAPHY  11/26/2007  ? mild LVH,trace MR,TR  ? ? ?Current Outpatient Medications  ?Medication Sig Dispense Refill  ? adapalene (DIFFERIN) 0.1 % cream Apply 1 application. topically daily as needed (acne).     ? apixaban (ELIQUIS) 5 MG TABS tablet Take 1 tablet (5 mg total) by mouth 2 (two) times daily. 180 tablet 1  ? Cholecalciferol (VITAMIN D3) 5000 units CAPS Take 10,000 Units by mouth every Monday, Tuesday, Wednesday, Thursday, and Friday.    ? metoprolol tartrate (LOPRESSOR) 25 MG tablet TAKE ONE TABLET DAILY AS NEEDED FOR PALPITATIONS. 30 tablet 3  ? metoprolol succinate (TOPROL-XL) 50 MG 24 hr tablet Taking 1.5 tablet by mouth twice daily 90 tablet 3  ? ?No current facility-administered medications for this encounter.  ? ? ?Allergies  ?Allergen Reactions  ? Ciprofloxacin Palpitations and Other (See Comments)  ?  AFIB ? ?  ? Macrodantin [Nitrofurantoin Macrocrystal] Hives  ? Sulfa Antibiotics Hives  ? Atorvastatin   ?  joints hurt too much  ? Epinephrine   ?  Heart races  ? ? ?Social History  ? ?Socioeconomic History  ? Marital status: Married  ?  Spouse name: Not on file  ? Number of children: 4  ? Years of education: Not on file  ? Highest education level: Some college, no degree  ?Occupational History  ? Not on file  ?Tobacco Use  ? Smoking status: Never  ? Smokeless tobacco: Never  ?Vaping Use  ? Vaping Use: Never used  ?Substance and Sexual Activity  ? Alcohol use: No  ?  Alcohol/week: 0.0 standard drinks  ? Drug use: No  ? Sexual activity: Yes  ?Other Topics Concern  ? Not on file  ?Social History Narrative  ? Pt lives in  [place] with spouse and 20 yr old granddaughter.  Retired Control and instrumentation engineer (kindergarten)  ? Right handed  ? Caffeine: tea, 1 cup/day  ? ?Social Determinants of Health  ? ?Financial Resource Strain: Not on file  ?Food Insecurity: Not on file  ?Transportation Needs: Not on file  ?Physical Activity: Not on file  ?Stress: Not on file  ?Social Connections: Not on file  ?Intimate Partner Violence: Not on file  ? ? ?Family History  ?Problem Relation Age of Onset  ? Diabetes Mother   ? Hypertension Mother   ? Kidney disease Mother   ? Diabetes Father   ? Hypertension Father   ? Migraines  Maternal Grandmother   ? Migraines Son   ?     childhood  ? Migraines Son   ?     childhood  ? ? ?ROS- All systems are reviewed and negative except as per the HPI above ? ?Physical Exam: ?Vitals:  ? 08/02/21 1039  ?BP: (!) 164/98  ?Pulse: 94  ?Weight: 79.2 kg  ?Height: '5\' 8"'$  (1.727 m)  ? ?Wt Readings from Last 3 Encounters:  ?08/02/21 79.2 kg  ?07/26/21 80.3 kg  ?07/12/21 78.5 kg  ? ? ?Labs: ?Lab Results  ?Component Value Date  ? NA 138 07/14/2021  ? K 3.9 07/14/2021  ? CL 102 07/14/2021  ? CO2 25 07/14/2021  ? GLUCOSE 131 (H) 07/14/2021  ? BUN 9 07/14/2021  ? CREATININE 0.70 07/14/2021  ? CALCIUM 9.8 07/14/2021  ? MG 1.9 02/22/2021  ? ?Lab Results  ?Component Value Date  ? INR 1.1 07/14/2021  ? ?Lab Results  ?Component Value Date  ? CHOL  11/05/2007  ?  136        ?ATP III CLASSIFICATION: ? <200     mg/dL   Desirable ? 200-239  mg/dL   Borderline High ? >=240    mg/dL   High  ? HDL 36 (L) 11/05/2007  ? [place]  11/05/2007  ?  71        ?Total Cholesterol/HDL:CHD Risk ?Coronary Heart Disease Risk Table ?                    Men   Women ? 1/2 Average Risk   3.4   3.3  ? TRIG 145 11/05/2007  ? ? ? ?GEN- The patient is well appearing, alert and oriented x 3 today.   ?Head- normocephalic, atraumatic ?Eyes-  Sclera clear, conjunctiva pink ?Ears- hearing intact ?Oropharynx- clear ?Neck- supple, no JVP ?Lymph- no cervical lymphadenopathy ?Lungs- Clear to ausculation bilaterally, normal work of breathing ?Heart- Regular rate and rhythm, no murmurs, rubs or gallops, PMI not laterally displaced ?GI- soft, NT, ND, + BS ?Extremities- no clubbing, cyanosis, or edema ?MS- no significant deformity or atrophy ?Skin- no rash or lesion ?Psych- euthymic mood, full affect ?Neuro- strength and sensation are intact ? ?Vent. rate 94 BPM ?PR interval 144 ms ?QRS duration 80 ms ?QT/QTcB 360/450 ms ?P-R-T axes 31 24 60 ?Normal sinus rhythm ?Normal ECG ?When compared with ECG of 26-Jul-2021 08:40, ?PREVIOUS ECG IS PRESENT ?No significant  change since ?Confirmed by Jenkins Rouge [id number]) on 08/02/2021 12:43:19 PM ? ? ?Assessment and Plan:  ?1. Afib ?S/p ablation 07/12/21 ?Noted palpitations yesterday and last pm ?In SR  ?By her description, sounds like PC's ?Reassured ?Will increase metoprolol to 75 mg bid  ?If this does not quiet it down will order  a one week zio patch ? ?2. HTN ?Elevated today ?Will check later  on at home ?Increase in BB will help   ? ?3. CHA2DS2VASc  score of 4 ?Continue eliquis 5 mg bid without interruption for the 3 month recovery period   ? ?For now will keep the May 17 th appointment with me ? ?Geroge Baseman Kayleen Memos, ANP-C ?Afib Clinic ?[facility] ?[address] ?[place], [place] [address] ?[phone]  ? ?

## 2021-08-02 NOTE — Patient Instructions (Signed)
Increase metoprolol to '75mg'$  twice a day (1.5 tablets twice a day) ?

## 2021-08-14 ENCOUNTER — Telehealth (HOSPITAL_COMMUNITY): Payer: Self-pay | Admitting: *Deleted

## 2021-08-14 NOTE — Telephone Encounter (Signed)
Patient called in stating since increasing metoprolol to '75mg'$  BID her symptoms have stabilized. She is feeling much better. Pt will call if issues arise.  ?

## 2021-08-16 ENCOUNTER — Ambulatory Visit (HOSPITAL_COMMUNITY): Payer: PPO | Admitting: Nurse Practitioner

## 2021-08-25 DIAGNOSIS — R350 Frequency of micturition: Secondary | ICD-10-CM | POA: Diagnosis not present

## 2021-08-25 DIAGNOSIS — B027 Disseminated zoster: Secondary | ICD-10-CM | POA: Diagnosis not present

## 2021-08-29 DIAGNOSIS — L820 Inflamed seborrheic keratosis: Secondary | ICD-10-CM | POA: Diagnosis not present

## 2021-08-29 DIAGNOSIS — L988 Other specified disorders of the skin and subcutaneous tissue: Secondary | ICD-10-CM | POA: Diagnosis not present

## 2021-08-30 ENCOUNTER — Emergency Department (HOSPITAL_COMMUNITY): Payer: PPO

## 2021-08-30 ENCOUNTER — Encounter (HOSPITAL_COMMUNITY): Payer: Self-pay | Admitting: Emergency Medicine

## 2021-08-30 ENCOUNTER — Other Ambulatory Visit: Payer: Self-pay

## 2021-08-30 ENCOUNTER — Emergency Department (HOSPITAL_COMMUNITY)
Admission: EM | Admit: 2021-08-30 | Discharge: 2021-08-30 | Disposition: A | Discharge status: Home / Self Care | Payer: PPO | Attending: Emergency Medicine | Admitting: Emergency Medicine

## 2021-08-30 DIAGNOSIS — Z7901 Long term (current) use of anticoagulants: Secondary | ICD-10-CM | POA: Insufficient documentation

## 2021-08-30 DIAGNOSIS — R739 Hyperglycemia, unspecified: Secondary | ICD-10-CM | POA: Diagnosis not present

## 2021-08-30 DIAGNOSIS — Z79899 Other long term (current) drug therapy: Secondary | ICD-10-CM | POA: Diagnosis not present

## 2021-08-30 DIAGNOSIS — E785 Hyperlipidemia, unspecified: Secondary | ICD-10-CM | POA: Diagnosis not present

## 2021-08-30 DIAGNOSIS — E1169 Type 2 diabetes mellitus with other specified complication: Secondary | ICD-10-CM | POA: Diagnosis not present

## 2021-08-30 DIAGNOSIS — R079 Chest pain, unspecified: Secondary | ICD-10-CM | POA: Diagnosis present

## 2021-08-30 DIAGNOSIS — I10 Essential (primary) hypertension: Secondary | ICD-10-CM | POA: Insufficient documentation

## 2021-08-30 DIAGNOSIS — I4891 Unspecified atrial fibrillation: Secondary | ICD-10-CM | POA: Diagnosis not present

## 2021-08-30 DIAGNOSIS — I480 Paroxysmal atrial fibrillation: Secondary | ICD-10-CM | POA: Diagnosis not present

## 2021-08-30 DIAGNOSIS — R0600 Dyspnea, unspecified: Secondary | ICD-10-CM | POA: Diagnosis not present

## 2021-08-30 LAB — CBC WITH DIFFERENTIAL/PLATELET
Abs Immature Granulocytes: 0.03 10*3/uL (ref 0.00–0.07)
Basophils Absolute: 0.1 10*3/uL (ref 0.0–0.1)
Basophils Relative: 1 %
Eosinophils Absolute: 0.2 10*3/uL (ref 0.0–0.5)
Eosinophils Relative: 2 %
HCT: 41.2 % (ref 36.0–46.0)
Hemoglobin: 13.3 g/dL (ref 12.0–15.0)
Immature Granulocytes: 0 %
Lymphocytes Relative: 42 %
Lymphs Abs: 4 10*3/uL (ref 0.7–4.0)
MCH: 31.1 pg (ref 26.0–34.0)
MCHC: 32.3 g/dL (ref 30.0–36.0)
MCV: 96.3 fL (ref 80.0–100.0)
Monocytes Absolute: 0.9 10*3/uL (ref 0.1–1.0)
Monocytes Relative: 10 %
Neutro Abs: 4.3 10*3/uL (ref 1.7–7.7)
Neutrophils Relative %: 45 %
Platelets: 282 10*3/uL (ref 150–400)
RBC: 4.28 MIL/uL (ref 3.87–5.11)
RDW: 13.6 % (ref 11.5–15.5)
WBC: 9.4 10*3/uL (ref 4.0–10.5)
nRBC: 0 % (ref 0.0–0.2)

## 2021-08-30 LAB — BASIC METABOLIC PANEL
Anion gap: 8 (ref 5–15)
BUN: 8 mg/dL (ref 8–23)
CO2: 27 mmol/L (ref 22–32)
Calcium: 10.2 mg/dL (ref 8.9–10.3)
Chloride: 101 mmol/L (ref 98–111)
Creatinine, Ser: 0.82 mg/dL (ref 0.44–1.00)
GFR, Estimated: >60 mL/min (ref >60)
Glucose, Bld: 224 mg/dL — ABNORMAL HIGH (ref 70–99)
Potassium: 3.8 mmol/L (ref 3.5–5.1)
Sodium: 136 mmol/L (ref 135–145)

## 2021-08-30 LAB — MAGNESIUM: Magnesium: 1.8 mg/dL (ref 1.7–2.4)

## 2021-08-30 MED ORDER — DILTIAZEM HCL 25 MG/5ML IV SOLN
15.0000 mg | Freq: Once | INTRAVENOUS | Status: AC
Start: 2021-08-30 — End: 2021-08-30
  Administered 2021-08-30: 15 mg via INTRAVENOUS
  Filled 2021-08-30: qty 5

## 2021-08-30 MED ORDER — SODIUM CHLORIDE 0.9 % IV SOLN
Freq: Once | INTRAVENOUS | Status: AC
Start: 2021-08-30 — End: 2021-08-30

## 2021-08-30 MED ORDER — ETOMIDATE 2 MG/ML IV SOLN
10.0000 mg | Freq: Once | INTRAVENOUS | Status: AC
  Administered 2021-08-30: 10 mg via INTRAVENOUS
  Filled 2021-08-30: qty 10

## 2021-08-30 MED ORDER — SODIUM CHLORIDE 0.9 % IV SOLN
INTRAVENOUS | Status: AC | PRN
  Administered 2021-08-30: 1000 mL via INTRAVENOUS

## 2021-08-30 NOTE — ED Provider Triage Note (Signed)
Emergency Medicine Provider Triage Evaluation Note  Amanda Frost , a 71 y.o. female  was evaluated in triage.  Pt complains of palpitations that began around a couple of hours PTA. Patient was @ rest with onset of palpitations and dyspnea, felt similar to prior afib, persisted prompting ED visit. Hx of paroxysmal afib on Eliquis S/p ablation 07/12/21, first episode of similar since then   Review of Systems  Positive: Palpitations, dyspnea Negative: Chest pain, dizziness, syncope, leg swelling  Physical Exam  There were no vitals taken for this visit. Gen:   Awake, no distress   Resp:  Normal effort  MSK:   Moves extremities without difficulty  Other:  Cardiac: irregularly irregular.   Medical Decision Making  Medically screening exam initiated at 12:39 AM.  Appropriate orders placed.  Amanda Frost was informed that the remainder of the evaluation will be completed by another provider, this initial triage assessment does not replace that evaluation, and the importance of remaining in the ED until their evaluation is complete.  Patient in afib w/ RVR.  Charge RN made aware for prioritizing of rooming.    Amaryllis Dyke, PA-C 08/30/21 0105

## 2021-08-30 NOTE — ED Notes (Signed)
Provider at bedside at this time

## 2021-08-30 NOTE — Progress Notes (Unsigned)
Cardiology Office Note Date:  08/31/2021  Patient ID:  Amanda Frost, DOB 06/15/1950, MRN [id number] PCP:  Crist Infante, MD  Cardiologist:  Dr. Sallyanne Kuster Electrophysiologist: Dr. Curt Bears    Chief Complaint:  post ER visit  History of Present Illness: Amanda Frost is a 71 y.o. female with history of liver cirrhosis secondary to nonalcoholic steatohepatitis, HTN,   She comes in today to be seen for Dr. Curt Bears, last seen by him at the time of her ablation, 07/12/21.    She saw D. Kayleen Memos, NP 07/26/21 with observed fast HRs since her ablation despite increased BB dose, though did not think she was having any AFib. She was in SR 90bpm, no changes were made.  She saw her again 08/02/21 with palpitations, seemed perhaps related with increased personal stress as well.  She was in SR, suspected ectopy, toprol increased further for BP as well  ER visit 08/30/21 with sudden inset of rapid rates/Afib symptoms, she was initially treated with IV dilt bolus, followed by DCCV successful with 120J  She was in Afib 139bpm >> 74bpm > SR 82bpm, normal intervals Labs were OK, though BS was 224.  TODAY She is accompanied by her husband. She has done well since the ER. No CP, no SOB Historically she had AFib that she feels like a flock of geese in her chest but not necessarily racing. Her Afib makes her feel pretty awful Yesterday when racing so fast says she could have functioned through but reall felt bad. No syncope She did not try the PRN lopressor. She has been compliant with her Eliquis, she is quite good  about her pills  She has noticed that her resting HR seems to generally be a bit fater from 60's-70's to the 80's-90's    AFib/AAD hx Diagnosed 2016 Flecainide was given it seems once in an ER (?) remotely Had cardioversion several hours afterwards 2021 ATach also discovered 2021, patient referred to EP, did not want to start flecainide with concerns of side effects 07/12/21:  PVI ablation  Past Medical History:  Diagnosis Date   Hypertension    Liver cirrhosis secondary to NASH (nonalcoholic steatohepatitis) ([place])    Ocular migraine    Palpitations    PAC's,PVC's,atrial tachycardia   Paroxysmal atrial fibrillation ([place])     Past Surgical History:  Procedure Laterality Date   ATRIAL FIBRILLATION ABLATION N/A 07/12/2021   Procedure: ATRIAL FIBRILLATION ABLATION;  Surgeon: Constance Haw, MD;  Location: [facility];  Service: Cardiovascular;  Laterality: N/A;   BREAST BIOPSY  1994   CARDIAC CATHETERIZATION  11/05/2007   normal LV fx,patent coronary arteries   NM MYOVIEW LTD  03/12/2005   normal   US ECHOCARDIOGRAPHY  11/26/2007   mild LVH,trace MR,TR    Current Outpatient Medications  Medication Sig Dispense Refill   adapalene (DIFFERIN) 0.1 % cream Apply 1 application. topically daily as needed (acne).     apixaban (ELIQUIS) 5 MG TABS tablet Take 1 tablet (5 mg total) by mouth 2 (two) times daily. 180 tablet 1   Cholecalciferol (VITAMIN D3) 5000 units CAPS Take 10,000 Units by mouth every Monday, Tuesday, Wednesday, Thursday, and Friday.     diltiazem (CARDIZEM) 30 MG tablet Take 1 tablet (30 mg total) by mouth 4 (four) times daily as needed (PALPITATIONS HEARTRATE HIGHER THAN 120 BPM). 90 tablet 0   diphenhydrAMINE HCl (ALLERGY MED PO) Take 1 tablet by mouth daily as needed (allergies).     metoprolol succinate (TOPROL-XL)  50 MG 24 hr tablet Taking 1.5 tablet by mouth twice daily (Patient taking differently: Take 75 mg by mouth in the morning and at bedtime.) 90 tablet 3   No current facility-administered medications for this visit.    Allergies:   Ciprofloxacin, Macrodantin [nitrofurantoin macrocrystal], Sulfa antibiotics, Atorvastatin, and Epinephrine   Social History:  The patient  reports that she has never smoked. She has never used smokeless tobacco. She reports that she does not drink alcohol and does not use drugs.   Family  History:  The patient's family history includes Diabetes in her father and mother; Hypertension in her father and mother; Kidney disease in her mother; Migraines in her maternal grandmother, son, and son.  ROS:  Please see the history of present illness.    All other systems are reviewed and otherwise negative.   PHYSICAL EXAM:  VS:  BP (!) 142/80   Pulse 82   Ht '5\' 8"'$  (1.727 m)   Wt 175 lb (79.4 kg)   SpO2 96%   BMI 26.61 kg/m  BMI: Body mass index is 26.61 kg/m. Well nourished, well developed, in no acute distress HEENT: normocephalic, atraumatic Neck: no JVD, carotid bruits or masses Cardiac:  RRR; no significant murmurs, no rubs, or gallops Lungs:  CTA b/l, no wheezing, rhonchi or rales Abd: soft, nontender MS: no deformity or  atrophy Ext:  no edema Skin: warm and dry, no rash Neuro:  No gross deficits appreciated Psych: euthymic mood, full affect    EKG:  ER EKGs are reviewed AFib 139bpm AFib 81bpm AFib 74bpm SR 82bpm, PR 173bpm, QRS 88bpm, QTc 43m   07/12/21: EPS/ablation CONCLUSIONS: 1. Sinus rhythm upon presentation.   2. Successful electrical isolation and anatomical encircling of all four pulmonary veins with radiofrequency current. 3. No inducible arrhythmias following ablation both on and off of dobutamine 4. No early apparent complications.  07/14/21: cardiac CT IMPRESSION: 1. There is normal pulmonary vein drainage into the left atrium. 2. The left atrial appendage is large chicken wing / broccoli type with a single lobe and ostial size 20 X 22 mm and length 48 mm. There is no thrombus in the left atrial appendage. 3. The esophagus runs in the left atrial midline and is not in the proximity to any of the pulmonary veins. 4. Calcium score: Coronary calcium score of 0. This was 0 percentile for age-, race-, and sex-matched controls. Left dominant coronary circulation. 5. 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 No dissection.  Atherosclerosis.  01/10/2014; TTE LVEF 55-65%, no WMA  Recent Labs: 07/14/2021: ALT 37 08/02/2021: TSH 2.223 08/30/2021: BUN 8; Creatinine, Ser 0.82; Hemoglobin 13.3; Magnesium 1.8; Platelets 282; Potassium 3.8; Sodium 136  No results found for requested labs within last 8760 hours.   Estimated Creatinine Clearance: 70.6 mL/min (by C-G formula based on SCr of 0.82 mg/dL).   Wt Readings from Last 3 Encounters:  08/31/21 175 lb (79.4 kg)  08/02/21 174 lb 9.6 oz (79.2 kg)  07/26/21 177 lb (80.3 kg)     Other studies reviewed: Additional studies/records reviewed today include: summarized above  ASSESSMENT AND PLAN:  Paroxysmal Afib CHA2DS2Vasc is 3, on Eliquis, appropriately dosed She is within her 3 mo post ablation window, discussed this at length with them today, potential management strategies her in the short term post ablation window.  No change, monitor 2. AAD, flecainide was discussed 3. Since a dose of IV diltiazem worked well in rate controlling her, change  her PRN lopressor (though she had not yet tried) to dilt  She is most comfortable with trying PRN diltiazem if she has RVR again, we discussed when/how to take/use it.   HTN No changes   Disposition: F/u with Dr. Curt Bears as scheduled, sooner if needed  Current medicines are reviewed at length with the patient today.  The patient did not have any concerns regarding medicines.  Venetia Night, PA-C 08/31/2021 11:09 AM     [facility] [address] [place]  [phone] (office)  [phone] (fax)

## 2021-08-30 NOTE — ED Notes (Signed)
Pt is AAOx4. Pt's vitals stable. Pt is in NSR. Pt is in no distress. Pt is resting comfortably.

## 2021-08-30 NOTE — ED Provider Notes (Signed)
[facility] EMERGENCY DEPARTMENT Provider Note  CSN: [id number] Arrival date & time: 08/30/21 7591  Chief Complaint(s) Atrial Fibrillation  HPI Amanda Frost is a 71 y.o. female with a past medical history listed below including A-fib with CHA2DS2-VASc score of 4, currently on Eliquis who recently had cardiac ablation for A-fib in April who presents to the emergency department with sudden onset of A-fib with rapid palpitations.  The history is provided by the patient.  Atrial Fibrillation This is a recurrent problem. The current episode started less than 1 hour ago. The problem occurs constantly. The problem has not changed since onset.Associated symptoms include chest pain (discomfort). Pertinent negatives include no abdominal pain, no headaches and no shortness of breath. Nothing aggravates the symptoms. Nothing relieves the symptoms. She has tried nothing for the symptoms.   Past Medical History Past Medical History:  Diagnosis Date   Hypertension    Liver cirrhosis secondary to NASH (nonalcoholic steatohepatitis) ([place])    Ocular migraine    Palpitations    PAC's,PVC's,atrial tachycardia   Paroxysmal atrial fibrillation [facility])    Patient Active Problem List   Diagnosis Date Noted   Migraine aura without headache 10/19/2020   Cervico-occipital neuralgia of right side 11/18/2018   Palpitations 10/15/2017   Chronic anticoagulation 12/05/2016   Paroxysmal atrial fibrillation ([place]) 01/21/2014   Liver cirrhosis secondary to NASH ([place]) 01/21/2014   Essential hypertension 10/29/2012   Paroxysmal atrial tachycardia ([place]) 10/29/2012   Home Medication(s) Prior to Admission medications   Medication Sig Start Date End Date Taking? Authorizing Provider  adapalene (DIFFERIN) 0.1 % cream Apply 1 application. topically daily as needed (acne).   Yes [provider]  apixaban (ELIQUIS) 5 MG TABS tablet Take 1 tablet (5 mg total) by mouth 2 (two) times daily.  10/25/20  Yes Croitoru, Mihai, MD  Cholecalciferol (VITAMIN D3) 5000 units CAPS Take 10,000 Units by mouth every Monday, Tuesday, Wednesday, Thursday, and Friday.   Yes [provider]  diphenhydrAMINE HCl (ALLERGY MED PO) Take 1 tablet by mouth daily as needed (allergies).   Yes [provider]  metoprolol succinate (TOPROL-XL) 50 MG 24 hr tablet Taking 1.5 tablet by mouth twice daily Patient taking differently: Take 75 mg by mouth in the morning and at bedtime. 08/02/21  Yes Sherran Needs, NP  metoprolol tartrate (LOPRESSOR) 25 MG tablet TAKE ONE TABLET DAILY AS NEEDED FOR PALPITATIONS. Patient taking differently: Take 25 mg by mouth daily as needed (palpitations). 06/16/21  Yes Croitoru, Mihai, MD                                                                                                                                    Allergies Ciprofloxacin, Macrodantin [nitrofurantoin macrocrystal], Sulfa antibiotics, Atorvastatin, and Epinephrine  Review of Systems Review of Systems  Respiratory:  Negative for shortness of breath.   Cardiovascular:  Positive for chest pain (discomfort).  Gastrointestinal:  Negative  for abdominal pain.  Neurological:  Negative for headaches.  As noted in HPI  Physical Exam Vital Signs  I have reviewed the triage vital signs BP 182/109  Pulse 141   Temp 98.7 F (37.1 C) (Oral)   Resp 15   SpO2 97%   Physical Exam Vitals reviewed.  Constitutional:      General: She is not in acute distress.    Appearance: She is well-developed. She is not diaphoretic.  HENT:     Head: Normocephalic and atraumatic.     Nose: Nose normal.  Eyes:     General: No scleral icterus.       Right eye: No discharge.        Left eye: No discharge.     Conjunctiva/sclera: Conjunctivae normal.     Pupils: Pupils are equal, round, and reactive to light.  Cardiovascular:     Rate and Rhythm: Tachycardia present. Rhythm irregularly irregular.     Heart sounds:  No murmur heard.   No friction rub. No gallop.  Pulmonary:     Effort: Pulmonary effort is normal. No respiratory distress.     Breath sounds: Normal breath sounds. No stridor. No rales.  Abdominal:     General: There is no distension.     Palpations: Abdomen is soft.     Tenderness: There is no abdominal tenderness.  Musculoskeletal:        General: No tenderness.     Cervical back: Normal range of motion and neck supple.  Skin:    General: Skin is warm and dry.     Findings: No erythema or rash.  Neurological:     Mental Status: She is alert and oriented to person, place, and time.    ED Results and Treatments Labs (all labs ordered are listed, but only abnormal results are displayed) Labs Reviewed  BASIC METABOLIC PANEL - Abnormal; Notable for the following components:      Result Value   Glucose, Bld 224 (*)    All other components within normal limits  CBC WITH DIFFERENTIAL/PLATELET  MAGNESIUM                                                                                                                         EKG  EKG Interpretation  Date/Time:  Wednesday Aug 30 2021 00:47:13 EDT Ventricular Rate:  139 PR Interval:    QRS Duration: 74 QT Interval:  248 QTC Calculation: 377 R Axis:   30 Text Interpretation: Atrial fibrillation with rapid ventricular response Cannot rule out Anterior infarct , age undetermined Marked ST abnormality, possible inferior subendocardial injury Abnormal ECG When compared with ECG of 02-Aug-2021 10:55, PREVIOUS ECG IS PRESENT Confirmed by Addison Lank [phone]) on 08/30/2021 1:04:16 AM        EKG Interpretation  Date/Time:  Wednesday Aug 30 2021 01:46:31 EDT Ventricular Rate:  74 PR Interval:    QRS Duration:  90 QT Interval:  376 QTC Calculation: 418 R Axis:  24 Text Interpretation: Atrial fibrillation Abnormal R-wave progression, early transition Probable LVH with secondary repol abnrm  Confirmed by Addison Lank [phone]) on  08/30/2021 5:29:31 AM        EKG Interpretation  Date/Time:  Wednesday Aug 30 2021 04:58:24 EDT Ventricular Rate:  82 PR Interval:  173 QRS Duration: 88 QT Interval:  359 QTC Calculation: 420 R Axis:   28 Text Interpretation: Sinus rhythm Abnormal R-wave progression, early transition Confirmed by Addison Lank [phone]) on 08/30/2021 5:29:48 AM         Radiology DG Chest Portable 1 View  Result Date: 08/30/2021 CLINICAL DATA:  Dyspnea EXAM: PORTABLE CHEST 1 VIEW COMPARISON:  CT chest dated 11/29/2020 FINDINGS: Lungs are clear.  No pleural effusion or pneumothorax. The heart is normal in size. IMPRESSION: No evidence of acute cardiopulmonary disease. Electronically Signed   By: Julian Hy M.D.   On: 08/30/2021 01:33    Pertinent labs & imaging results that were available during my care of the patient were reviewed by me and considered in my medical decision making (see MDM for details).  Medications Ordered in ED Medications  diltiazem (CARDIZEM) injection 15 mg (15 mg Intravenous Given 08/30/21 0138)  0.9 %  sodium chloride infusion (0 mLs Intravenous Stopped 08/30/21 0444)  etomidate (AMIDATE) injection 10 mg (10 mg Intravenous Given 08/30/21 0456)  0.9 %  sodium chloride infusion (0 mLs Intravenous Stopped 08/30/21 0550)                                                                                                                                     Procedures .Cardioversion  Date/Time: 08/30/2021 4:03 AM Performed by: Fatima Blank, MD Authorized by: Fatima Blank, MD   Consent:    Consent obtained:  Written   Consent given by:  Patient   Risks discussed:  Cutaneous burn, death, induced arrhythmia and pain   Alternatives discussed:  Rate-control medication and alternative treatment Pre-procedure details:    Cardioversion basis:  Emergent   Rhythm:  Atrial fibrillation   Electrode placement:  Anterior-posterior Patient sedated: Yes. Refer to  sedation procedure documentation for details of sedation.  Attempt one:    Cardioversion mode:  Synchronous   Waveform:  Biphasic   Shock (Joules):  120   Shock outcome:  Conversion to normal sinus rhythm Post-procedure details:    Patient status:  Awake   Patient tolerance of procedure:  Tolerated well, no immediate complications .Sedation  Date/Time: 08/30/2021 5:04 AM Performed by: Fatima Blank, MD Authorized by: Fatima Blank, MD   Consent:    Consent obtained:  Written   Consent given by:  Patient   Risks discussed:  Allergic reaction, dysrhythmia, prolonged hypoxia resulting in organ damage, prolonged sedation necessitating reversal, respiratory compromise necessitating ventilatory assistance and intubation and nausea   Alternatives discussed:  Analgesia without sedation Universal protocol:    Procedure explained and questions answered  to patient or proxy's satisfaction: yes     Immediately prior to procedure, a time out was called: yes   Pre-sedation assessment:    Time since last food or drink:  2000   ASA classification: class 2 - patient with mild systemic disease     Mouth opening:  2 finger widths   Thyromental distance:  3 finger widths   Mallampati score:  II - soft palate, uvula, fauces visible   Neck mobility: normal     Pre-sedation assessments completed and reviewed: airway patency, cardiovascular function, hydration status, mental status, pain level and respiratory function     Pre-sedation assessment completed:  08/30/2021 4:55 AM Immediate pre-procedure details:    Reassessment: Patient reassessed immediately prior to procedure     Reviewed: vital signs and relevant labs/tests     Verified: bag valve mask available, emergency equipment available, intubation equipment available, IV patency confirmed and oxygen available   Procedure details (see MAR for exact dosages):    Preoxygenation:  Nasal cannula   Sedation:  Etomidate   Intended level  of sedation: deep   Intra-procedure monitoring:  Blood pressure monitoring, continuous capnometry, frequent LOC assessments, cardiac monitor, continuous pulse oximetry and frequent vital sign checks   Intra-procedure events: none     Total Provider sedation time (minutes):  15 Post-procedure details:    Post-sedation assessment completed:  08/30/2021 5:38 AM   Post-sedation assessments completed and reviewed: airway patency, cardiovascular function, mental status, nausea/vomiting, pain level and respiratory function     Patient is stable for discharge or admission: yes     Procedure completion:  Tolerated well, no immediate complications .Critical Care Performed by: Fatima Blank, MD Authorized by: Fatima Blank, MD   Critical care provider statement:    Critical care time (minutes):  75   Critical care time was exclusive of:  Separately billable procedures and treating other patients   Critical care was necessary to treat or prevent imminent or life-threatening deterioration of the following conditions:  Cardiac failure   Critical care was time spent personally by me on the following activities:  Development of treatment plan with patient or surrogate, discussions with consultants, evaluation of patient's response to treatment, examination of patient, obtaining history from patient or surrogate, review of old charts, re-evaluation of patient's condition, pulse oximetry, ordering and review of radiographic studies, ordering and review of laboratory studies and ordering and performing treatments and interventions  (including critical care time)   Medical Decision Making / ED Course    Complexity of Problem:  Co-morbidities/SDOH that complicate the patient evaluation/care: Noted above in HPI  Additional history obtained: During A-fib clinic note on 3 May patient was noted to have normal sinus rhythm  Patient's presenting problem/concern, DDX, and MDM listed  below: A-fib RVR We will assess for any electrolyte derangements. Patient is compliant with her medications including Eliquis and has not missed any doses.  Hospitalization Considered:  Yes  Initial Intervention:  Provided with IV fluids and a dose of diltiazem bolus.    Complexity of Data:   Cardiac Monitoring: The patient was maintained on a cardiac monitor.   I personally viewed and interpreted the cardiac monitored which showed an underlying rhythm of A-fib RVR with rates in the 130s to 140s EKG confirmed A-fib RVR  Laboratory Tests ordered listed below with my independent interpretation: CBC without leukocytosis or anemia No significant electrolyte derangements or renal sufficiency. Mild hyperglycemia without evidence of DKA   Imaging Studies ordered listed  below with my independent interpretation: Chest x-ray without evidence of pneumonia, pneumothorax or pulmonary edema.     ED Course:    Assessment, Add'l Intervention, and Reassessment: A-fib RVR Rate improved with diltiazem bolus.  Patient still in atrial fibrillation. Discussed options for cardioversion in the emergency department which she is willing to do.  Will consult cardiology for approval given patient's recent procedure. I spoke with Dr. Clayton Bibles who approved cardioversion here in the emergency department. Cardioversion as above was successful Patient was monitored for several hours and remained hemodynamically stable.  Appropriate for discharge home with cardiology follow-up   Final Clinical Impression(s) / ED Diagnoses Final diagnoses:  Atrial fibrillation with RVR ([place])   The patient appears reasonably screened and/or stabilized for discharge and I doubt any other medical condition or other [facility] requiring further screening, evaluation, or treatment in the ED at this time prior to discharge. Safe for discharge with strict return precautions.  Disposition: Discharge  Condition: Good  I have discussed  the results, Dx and Tx plan with the patient/family who expressed understanding and agree(s) with the plan. Discharge instructions discussed at length. The patient/family was given strict return precautions who verbalized understanding of the instructions. No further questions at time of discharge.    ED Discharge Orders          Ordered    Amb Referral to AFIB Clinic        08/30/21 0509           Follow Up: Constance Haw, [facility] [address] [phone]  Call  to schedule an appointment for close follow up           This chart was dictated using voice recognition software.  Despite best efforts to proofread,  errors can occur which can change the documentation meaning.    Fatima Blank, MD 08/30/21 [phone]

## 2021-08-30 NOTE — ED Triage Notes (Signed)
Pt c/o feeling her heart racing, hx afib and recent ablation. HR 130-150s.

## 2021-08-30 NOTE — ED Notes (Signed)
Pt AAOx4. Pt is on RA. Pt is ambulatory.

## 2021-08-30 NOTE — Sedation Documentation (Signed)
Pt AAOx4. Pt's vitals stable. Pt in no acute distress. Pt is resting comfortably.

## 2021-08-31 ENCOUNTER — Ambulatory Visit: Payer: PPO | Admitting: Physician Assistant

## 2021-08-31 ENCOUNTER — Telehealth: Payer: Self-pay | Admitting: Cardiology

## 2021-08-31 ENCOUNTER — Encounter: Payer: Self-pay | Admitting: Physician Assistant

## 2021-08-31 VITALS — BP 142/80 | HR 82 | Ht 68.0 in | Wt 175.0 lb

## 2021-08-31 DIAGNOSIS — E1169 Type 2 diabetes mellitus with other specified complication: Secondary | ICD-10-CM | POA: Diagnosis not present

## 2021-08-31 DIAGNOSIS — I480 Paroxysmal atrial fibrillation: Secondary | ICD-10-CM | POA: Diagnosis not present

## 2021-08-31 DIAGNOSIS — I10 Essential (primary) hypertension: Secondary | ICD-10-CM

## 2021-08-31 DIAGNOSIS — E559 Vitamin D deficiency, unspecified: Secondary | ICD-10-CM | POA: Diagnosis not present

## 2021-08-31 DIAGNOSIS — E785 Hyperlipidemia, unspecified: Secondary | ICD-10-CM | POA: Diagnosis not present

## 2021-08-31 MED ORDER — DILTIAZEM HCL 30 MG PO TABS
30.0000 mg | ORAL_TABLET | Freq: Four times a day (QID) | ORAL | 0 refills | Status: DC | PRN
Start: 2021-08-31 — End: 2022-12-16

## 2021-08-31 NOTE — Telephone Encounter (Signed)
Patient states that she didn't have ekg done today but its showing up that she did. Please advise

## 2021-08-31 NOTE — Patient Instructions (Addendum)
Medication Instructions:   START TAKING DILTIAZEM 30 MG AS NEEDED FOR PALPITATIONS:  FOR HEART RATE HIGHER THAN 120 MG   STOP TAKING AND REMOVE THIS MEDICATION FROM YOUR MEDICATION LIST: MEDICATION TARTRATE.     *If you need a refill on your cardiac medications before your next appointment, please call your pharmacy*   Lab [facility]    If you have labs (blood work) drawn today and your tests are completely normal, you will receive your results only by: [address] (if you have MyChart) OR A paper copy in the mail If you have any lab test that is abnormal or we need to change your treatment, we will call you to review the results.   Testing/Procedures: NONE ORDERED  TODAY   Follow-Up: At [facility], you and your health needs are our priority.  As part of our continuing mission to provide you with exceptional heart care, we have created designated Provider Care Teams.  These Care Teams include your primary Cardiologist (physician) and Advanced Practice Providers (APPs -  Physician Assistants and Nurse Practitioners) who all work together to provide you with the care you need, when you need it.  We recommend signing up for the patient portal called "MyChart".  Sign up information is provided on this After Visit Summary.  MyChart is used to connect with patients for Virtual Visits (Telemedicine).  Patients are able to view lab/test results, encounter notes, upcoming appointments, etc.  Non-urgent messages can be sent to your provider as well.   To learn more about what you can do with MyChart, go to [email or web address].    Your next appointment:   1 month(s)  The format for your next appointment:   In Person  Provider:   Allegra Lai, MD      Other Instructions   Important Information About Sugar

## 2021-09-04 ENCOUNTER — Other Ambulatory Visit: Payer: Self-pay | Admitting: Internal Medicine

## 2021-09-04 ENCOUNTER — Encounter (HOSPITAL_COMMUNITY): Payer: Self-pay | Admitting: Physician Assistant

## 2021-09-04 ENCOUNTER — Ambulatory Visit (HOSPITAL_COMMUNITY)
Admission: RE | Admit: 2021-09-04 | Discharge: 2021-09-04 | Disposition: A | Discharge status: Home / Self Care | Payer: PPO | Source: Ambulatory Visit | Attending: Physician Assistant | Admitting: Physician Assistant

## 2021-09-04 VITALS — BP 140/90 | HR 77 | Ht 68.0 in | Wt 171.4 lb

## 2021-09-04 DIAGNOSIS — I480 Paroxysmal atrial fibrillation: Secondary | ICD-10-CM | POA: Insufficient documentation

## 2021-09-04 DIAGNOSIS — R911 Solitary pulmonary nodule: Secondary | ICD-10-CM

## 2021-09-04 DIAGNOSIS — I471 Supraventricular tachycardia: Secondary | ICD-10-CM | POA: Insufficient documentation

## 2021-09-04 DIAGNOSIS — D6869 Other thrombophilia: Secondary | ICD-10-CM | POA: Diagnosis not present

## 2021-09-04 DIAGNOSIS — Z7901 Long term (current) use of anticoagulants: Secondary | ICD-10-CM | POA: Insufficient documentation

## 2021-09-04 DIAGNOSIS — I10 Essential (primary) hypertension: Secondary | ICD-10-CM | POA: Diagnosis not present

## 2021-09-04 NOTE — Progress Notes (Signed)
Primary Care Physician: Crist Infante, MD Referring Physician: Dr. Esau Grew Amanda Frost is a 71 y.o. female with a h/o HTN, PAF, that recently underwent afib ablation 07/12/21. She asked to be seen today as she has noted increased heart rate since the ablation. BB recently increased. She is in SR and has not noted any afib.   No swallowing or groin issues since the procedure. She has noted some elevation of BP since procedure. She is watching this at home. She returned to work this week. She has noted more energy since the procedure.   F/u afib clinic5/3/23,  as pt wanted to be seen today for noticing some palpitations since yesterday. EKG shows SR. By her description sounds like palpitations. She has had a lot of family stress. TSH noted to be elevated 5 months ago, will recheck.  Follow up in the AF clinic 09/04/21. Patient was seen at the ED 08/30/21 with rapid afib and underwent DCCV at that time. She had an office visit with Tommye Standard the following day and was started on PRN diltiazem. Patient has done well over the past several days with no interim afib. No bleeding issues on anticoagulation.   Today, she denies symptoms of chest pain, shortness of breath, orthopnea, PND, lower extremity edema, dizziness, presyncope, syncope, or neurologic sequela. The patient is tolerating medications without difficulties and is otherwise without complaint today.   Past Medical History:  Diagnosis Date   Hypertension    Liver cirrhosis secondary to NASH (nonalcoholic steatohepatitis) ([place])    Ocular migraine    Palpitations    PAC's,PVC's,atrial tachycardia   Paroxysmal atrial fibrillation ([facility])    Past Surgical History:  Procedure Laterality Date   ATRIAL FIBRILLATION ABLATION N/A 07/12/2021   Procedure: ATRIAL FIBRILLATION ABLATION;  Surgeon: Constance Haw, MD;  Location: [facility] CV LAB;  Service: Cardiovascular;  Laterality: N/A;   BREAST BIOPSY  1994   CARDIAC  CATHETERIZATION  11/05/2007   normal LV fx,patent coronary arteries   NM MYOVIEW LTD  03/12/2005   normal   US ECHOCARDIOGRAPHY  11/26/2007   mild LVH,trace MR,TR    Current Outpatient Medications  Medication Sig Dispense Refill   adapalene (DIFFERIN) 0.1 % cream Apply 1 application. topically daily as needed (acne).     apixaban (ELIQUIS) 5 MG TABS tablet Take 1 tablet (5 mg total) by mouth 2 (two) times daily. 180 tablet 1   Cholecalciferol (VITAMIN D3) 5000 units CAPS Take 10,000 Units by mouth every Monday, Tuesday, Wednesday, Thursday, and Friday.     diltiazem (CARDIZEM) 30 MG tablet Take 1 tablet (30 mg total) by mouth 4 (four) times daily as needed (PALPITATIONS HEARTRATE HIGHER THAN 120 BPM). 90 tablet 0   diphenhydrAMINE HCl (ALLERGY MED PO) Take 1 tablet by mouth daily as needed (allergies).     metoprolol succinate (TOPROL-XL) 50 MG 24 hr tablet Taking 1.5 tablet by mouth twice daily 90 tablet 3   No current facility-administered medications for this encounter.    Allergies  Allergen Reactions   Ciprofloxacin Palpitations and Other (See Comments)    AFIB     Macrodantin [Nitrofurantoin Macrocrystal] Hives   Sulfa Antibiotics Hives   Atorvastatin     joints hurt too much   Epinephrine     Heart races    Social History   Socioeconomic History   Marital status: Married    Spouse name: Not on file   Number of children: 4   Years of  education: Not on file   Highest education level: Some college, no degree  Occupational History   Not on file  Tobacco Use   Smoking status: Never   Smokeless tobacco: Never   Tobacco comments:    Never smoke 09/04/21  Vaping Use   Vaping Use: Never used  Substance and Sexual Activity   Alcohol use: No    Alcohol/week: 0.0 standard drinks   Drug use: No   Sexual activity: Yes  Other Topics Concern   Not on file  Social History Narrative   Pt lives in [place] with spouse and 62 yr old granddaughter.  Retired Advertising copywriter (kindergarten)   Right handed   Caffeine: tea, 1 cup/day   Social Determinants of Health   Financial Resource Strain: Not on file  Food Insecurity: Not on file  Transportation Needs: Not on file  Physical Activity: Not on file  Stress: Not on file  Social Connections: Not on file  Intimate Partner Violence: Not on file    Family History  Problem Relation Age of Onset   Diabetes Mother    Hypertension Mother    Kidney disease Mother    Diabetes Father    Hypertension Father    Migraines Maternal Grandmother    Migraines Son        childhood   Migraines Son        childhood    ROS- All systems are reviewed and negative except as per the HPI above  Physical Exam: Vitals:   09/04/21 1431  BP: 140/90  Pulse: 77  Weight: 77.7 kg  Height: '5\' 8"'$  (1.727 m)    Wt Readings from Last 3 Encounters:  09/04/21 77.7 kg  08/31/21 79.4 kg  08/02/21 79.2 kg    Labs: Lab Results  Component Value Date   NA 136 08/30/2021   K 3.8 08/30/2021   CL 101 08/30/2021   CO2 27 08/30/2021   GLUCOSE 224 (H) 08/30/2021   BUN 8 08/30/2021   CREATININE 0.82 08/30/2021   CALCIUM 10.2 08/30/2021   MG 1.8 08/30/2021   Lab Results  Component Value Date   INR 1.1 07/14/2021   Lab Results  Component Value Date   CHOL  11/05/2007    136        ATP III CLASSIFICATION:  <200     mg/dL   Desirable  200-239  mg/dL   Borderline High  >=240    mg/dL   High   HDL 36 (L) 11/05/2007   LDLCALC  11/05/2007    71        Total Cholesterol/HDL:CHD Risk Coronary Heart Disease Risk Table                     Men   Women  1/2 Average Risk   3.4   3.3   TRIG 145 11/05/2007    GEN- The patient is a well appearing female, alert and oriented x 3 today.   HEENT-head normocephalic, atraumatic, sclera clear, conjunctiva pink, hearing intact, trachea midline. Lungs- Clear to ausculation bilaterally, normal work of breathing Heart- Regular rate and rhythm, no murmurs, rubs or gallops  GI-  soft, NT, ND, + BS Extremities- no clubbing, cyanosis, or edema MS- no significant deformity or atrophy Skin- no rash or lesion Psych- euthymic mood, full affect Neuro- strength and sensation are intact   EKG- SR Vent. rate 77 BPM PR interval 152 ms QRS duration 84 ms QT/QTcB 380/430 ms   Assessment and  Plan:  1. Paroxysmal Afib/atrial tachycardia S/p ablation 07/12/21 S/p DCCV 08/30/21 Patient in [place] today. We discussed short term AAD (flecainide) vs repeat ablation if she continues to have symptomatic afib. She would like to continue present therapy for now.  Continue metoprolol 75 mg BID Continue diltiazem 30 mg q 4 hours PRN for heart racing.   2. HTN Stable, no changes today.    3. CHA2DS2VASc  score of 4 Continue eliquis 5 mg bid without interruption for 3 months post ablation.    Follow up with Dr Curt Bears as scheduled.   [facility] [address] [place], [place] [address] [phone]

## 2021-09-06 DIAGNOSIS — N83209 Unspecified ovarian cyst, unspecified side: Secondary | ICD-10-CM | POA: Diagnosis not present

## 2021-09-07 DIAGNOSIS — M810 Age-related osteoporosis without current pathological fracture: Secondary | ICD-10-CM | POA: Diagnosis not present

## 2021-09-08 DIAGNOSIS — I77810 Thoracic aortic ectasia: Secondary | ICD-10-CM | POA: Diagnosis not present

## 2021-09-08 DIAGNOSIS — K760 Fatty (change of) liver, not elsewhere classified: Secondary | ICD-10-CM | POA: Diagnosis not present

## 2021-09-08 DIAGNOSIS — E1169 Type 2 diabetes mellitus with other specified complication: Secondary | ICD-10-CM | POA: Diagnosis not present

## 2021-09-08 DIAGNOSIS — I10 Essential (primary) hypertension: Secondary | ICD-10-CM | POA: Diagnosis not present

## 2021-09-08 DIAGNOSIS — N390 Urinary tract infection, site not specified: Secondary | ICD-10-CM | POA: Diagnosis not present

## 2021-09-08 DIAGNOSIS — M810 Age-related osteoporosis without current pathological fracture: Secondary | ICD-10-CM | POA: Diagnosis not present

## 2021-09-08 DIAGNOSIS — R911 Solitary pulmonary nodule: Secondary | ICD-10-CM | POA: Diagnosis not present

## 2021-09-08 DIAGNOSIS — I700 Atherosclerosis of aorta: Secondary | ICD-10-CM | POA: Diagnosis not present

## 2021-09-08 DIAGNOSIS — Z7901 Long term (current) use of anticoagulants: Secondary | ICD-10-CM | POA: Diagnosis not present

## 2021-09-08 DIAGNOSIS — Z1339 Encounter for screening examination for other mental health and behavioral disorders: Secondary | ICD-10-CM | POA: Diagnosis not present

## 2021-09-08 DIAGNOSIS — D6869 Other thrombophilia: Secondary | ICD-10-CM | POA: Diagnosis not present

## 2021-09-08 DIAGNOSIS — Z0000 Encounter for general adult medical examination without abnormal findings: Secondary | ICD-10-CM | POA: Diagnosis not present

## 2021-09-08 DIAGNOSIS — R82998 Other abnormal findings in urine: Secondary | ICD-10-CM | POA: Diagnosis not present

## 2021-09-08 DIAGNOSIS — I480 Paroxysmal atrial fibrillation: Secondary | ICD-10-CM | POA: Diagnosis not present

## 2021-09-08 DIAGNOSIS — Z1331 Encounter for screening for depression: Secondary | ICD-10-CM | POA: Diagnosis not present

## 2021-09-09 DIAGNOSIS — I10 Essential (primary) hypertension: Secondary | ICD-10-CM | POA: Diagnosis not present

## 2021-09-09 DIAGNOSIS — K760 Fatty (change of) liver, not elsewhere classified: Secondary | ICD-10-CM | POA: Diagnosis not present

## 2021-09-09 DIAGNOSIS — R7989 Other specified abnormal findings of blood chemistry: Secondary | ICD-10-CM | POA: Insufficient documentation

## 2021-09-09 DIAGNOSIS — R1032 Left lower quadrant pain: Secondary | ICD-10-CM | POA: Diagnosis present

## 2021-09-09 DIAGNOSIS — Z7901 Long term (current) use of anticoagulants: Secondary | ICD-10-CM | POA: Insufficient documentation

## 2021-09-09 DIAGNOSIS — N132 Hydronephrosis with renal and ureteral calculous obstruction: Secondary | ICD-10-CM | POA: Diagnosis not present

## 2021-09-09 DIAGNOSIS — Z79899 Other long term (current) drug therapy: Secondary | ICD-10-CM | POA: Insufficient documentation

## 2021-09-09 DIAGNOSIS — N23 Unspecified renal colic: Secondary | ICD-10-CM | POA: Diagnosis not present

## 2021-09-10 ENCOUNTER — Other Ambulatory Visit: Payer: Self-pay

## 2021-09-10 ENCOUNTER — Emergency Department (HOSPITAL_COMMUNITY): Payer: PPO

## 2021-09-10 ENCOUNTER — Emergency Department (HOSPITAL_BASED_OUTPATIENT_CLINIC_OR_DEPARTMENT_OTHER): Payer: PPO | Admitting: Radiology

## 2021-09-10 ENCOUNTER — Emergency Department (HOSPITAL_BASED_OUTPATIENT_CLINIC_OR_DEPARTMENT_OTHER)
Admission: EM | Admit: 2021-09-10 | Discharge: 2021-09-10 | Disposition: A | Discharge status: Home / Self Care | Payer: PPO | Attending: Emergency Medicine | Admitting: Emergency Medicine

## 2021-09-10 DIAGNOSIS — N23 Unspecified renal colic: Secondary | ICD-10-CM

## 2021-09-10 DIAGNOSIS — R1030 Lower abdominal pain, unspecified: Secondary | ICD-10-CM | POA: Diagnosis not present

## 2021-09-10 DIAGNOSIS — N132 Hydronephrosis with renal and ureteral calculous obstruction: Secondary | ICD-10-CM | POA: Diagnosis not present

## 2021-09-10 DIAGNOSIS — R109 Unspecified abdominal pain: Secondary | ICD-10-CM

## 2021-09-10 DIAGNOSIS — K760 Fatty (change of) liver, not elsewhere classified: Secondary | ICD-10-CM | POA: Diagnosis not present

## 2021-09-10 LAB — COMPREHENSIVE METABOLIC PANEL
ALT: 64 U/L — ABNORMAL HIGH (ref 0–44)
AST: 56 U/L — ABNORMAL HIGH (ref 15–41)
Albumin: 4.8 g/dL (ref 3.5–5.0)
Alkaline Phosphatase: 119 U/L (ref 38–126)
Anion gap: 10 (ref 5–15)
BUN: 14 mg/dL (ref 8–23)
CO2: 25 mmol/L (ref 22–32)
Calcium: 10.5 mg/dL — ABNORMAL HIGH (ref 8.9–10.3)
Chloride: 103 mmol/L (ref 98–111)
Creatinine, Ser: 0.97 mg/dL (ref 0.44–1.00)
GFR, Estimated: >60 mL/min (ref >60)
Glucose, Bld: 155 mg/dL — ABNORMAL HIGH (ref 70–99)
Potassium: 3.8 mmol/L (ref 3.5–5.1)
Sodium: 138 mmol/L (ref 135–145)
Total Bilirubin: 1.3 mg/dL — ABNORMAL HIGH (ref 0.3–1.2)
Total Protein: 7.8 g/dL (ref 6.5–8.1)

## 2021-09-10 LAB — CBC WITH DIFFERENTIAL/PLATELET
Abs Immature Granulocytes: 0.02 10*3/uL (ref 0.00–0.07)
Basophils Absolute: 0.1 10*3/uL (ref 0.0–0.1)
Basophils Relative: 1 %
Eosinophils Absolute: 0.1 10*3/uL (ref 0.0–0.5)
Eosinophils Relative: 1 %
HCT: 41.2 % (ref 36.0–46.0)
Hemoglobin: 13.3 g/dL (ref 12.0–15.0)
Immature Granulocytes: 0 %
Lymphocytes Relative: 28 %
Lymphs Abs: 2.4 10*3/uL (ref 0.7–4.0)
MCH: 30.3 pg (ref 26.0–34.0)
MCHC: 32.3 g/dL (ref 30.0–36.0)
MCV: 93.8 fL (ref 80.0–100.0)
Monocytes Absolute: 1 10*3/uL (ref 0.1–1.0)
Monocytes Relative: 11 %
Neutro Abs: 5.1 10*3/uL (ref 1.7–7.7)
Neutrophils Relative %: 59 %
Platelets: 311 10*3/uL (ref 150–400)
RBC: 4.39 MIL/uL (ref 3.87–5.11)
RDW: 13.6 % (ref 11.5–15.5)
WBC: 8.6 10*3/uL (ref 4.0–10.5)
nRBC: 0 % (ref 0.0–0.2)

## 2021-09-10 LAB — URINALYSIS, ROUTINE W REFLEX MICROSCOPIC
Bilirubin Urine: NEGATIVE
Glucose, UA: NEGATIVE mg/dL
Ketones, ur: NEGATIVE mg/dL
Nitrite: NEGATIVE
Protein, ur: NEGATIVE mg/dL
Specific Gravity, Urine: 1.011 (ref 1.005–1.030)
pH: 5.5 (ref 5.0–8.0)

## 2021-09-10 LAB — LIPASE, BLOOD: Lipase: 25 U/L (ref 11–51)

## 2021-09-10 MED ORDER — KETOROLAC TROMETHAMINE 60 MG/2ML IM SOLN: 15.0000 mg | Freq: Once | INTRAMUSCULAR | Status: DC

## 2021-09-10 MED ORDER — ONDANSETRON 4 MG PO TBDP: ORAL_TABLET | ORAL | 0 refills | Status: DC

## 2021-09-10 MED ORDER — SODIUM CHLORIDE 0.9 % IV BOLUS
1000.0000 mL | Freq: Once | INTRAVENOUS | Status: AC
  Administered 2021-09-10: 1000 mL via INTRAVENOUS

## 2021-09-10 MED ORDER — OXYCODONE-ACETAMINOPHEN 5-325 MG PO TABS: 1.0000 | ORAL_TABLET | ORAL | 0 refills | Status: DC | PRN

## 2021-09-10 MED ORDER — TAMSULOSIN HCL 0.4 MG PO CAPS: 0.4000 mg | ORAL_CAPSULE | Freq: Every day | ORAL | 0 refills | Status: DC

## 2021-09-10 MED ORDER — MELOXICAM 15 MG PO TABS: 15.0000 mg | ORAL_TABLET | Freq: Every day | ORAL | 0 refills | Status: DC

## 2021-09-10 MED ORDER — TAMSULOSIN HCL 0.4 MG PO CAPS
0.4000 mg | ORAL_CAPSULE | Freq: Once | ORAL | Status: AC
  Administered 2021-09-10: 0.4 mg via ORAL
  Filled 2021-09-10: qty 1

## 2021-09-10 MED ORDER — HYOSCYAMINE SULFATE 0.125 MG SL SUBL
0.2500 mg | SUBLINGUAL_TABLET | Freq: Once | SUBLINGUAL | Status: AC
  Administered 2021-09-10: 0.25 mg via SUBLINGUAL
  Filled 2021-09-10: qty 2

## 2021-09-10 MED ORDER — ALUM & MAG HYDROXIDE-SIMETH 200-200-20 MG/5ML PO SUSP
30.0000 mL | Freq: Once | ORAL | Status: AC
  Administered 2021-09-10: 30 mL via ORAL
  Filled 2021-09-10: qty 30

## 2021-09-10 MED ORDER — LIDOCAINE VISCOUS HCL 2 % MT SOLN
15.0000 mL | Freq: Once | OROMUCOSAL | Status: AC
  Administered 2021-09-10: 15 mL via ORAL
  Filled 2021-09-10: qty 15

## 2021-09-10 MED ORDER — KETOROLAC TROMETHAMINE 15 MG/ML IJ SOLN
15.0000 mg | Freq: Once | INTRAMUSCULAR | Status: AC
  Administered 2021-09-10: 15 mg via INTRAVENOUS
  Filled 2021-09-10: qty 1

## 2021-09-10 NOTE — ED Notes (Signed)
Pt arrived from [place]. Ambulatory to room and restroom.

## 2021-09-10 NOTE — ED Provider Notes (Signed)
3:56 AM Assumed care from Dr. Leonette Monarch, please see their note for full history, physical and decision making until this point. In brief this is a 71 y.o. year old female who presented to the ED tonight with Abdominal Pain     Here with a couple episodes of severe abdominal pain associated with symptoms c/w UTI. Started keflex yesterday but pain unmanabgeable so went to [facility] where workup showed hematuria, sent here for ct scan to r/o obstruction.   Ct viewed and interpreted by my and is consistent with what appears to be distal kidney stone. Does have some distended small bowel and stomach but no obvious transition point. Radiology read reviewed and c/w same. Symptom free right now. Will continue antibiotics. I will rx analgesics, zofran, flomax. Urology FU as needed.   Discharge instructions, including strict return precautions for new or worsening symptoms, given. Patient and/or family verbalized understanding and agreement with the plan as described.   Labs, studies and imaging reviewed by myself and considered in medical decision making if ordered. Imaging interpreted by radiology.  Labs Reviewed  URINALYSIS, ROUTINE W REFLEX MICROSCOPIC - Abnormal; Notable for the following components:      Result Value   Hgb urine dipstick LARGE (*)    Leukocytes,Ua TRACE (*)    Bacteria, UA RARE (*)    All other components within normal limits  COMPREHENSIVE METABOLIC PANEL - Abnormal; Notable for the following components:   Glucose, Bld 155 (*)    Calcium 10.5 (*)    AST 56 (*)    ALT 64 (*)    Total Bilirubin 1.3 (*)    All other components within normal limits  CBC WITH DIFFERENTIAL/PLATELET  LIPASE, BLOOD    CT ABDOMEN PELVIS WO CONTRAST  Final Result    DG ABD ACUTE 2+V W 1V CHEST  Final Result      No follow-ups on file.    Michaeljoseph Revolorio, Corene Cornea, MD 09/10/21 [phone]

## 2021-09-10 NOTE — ED Triage Notes (Signed)
Pt c/o abdominal pain lower abdomen described as pressure onset last night. Started treatment for UTI yesterday d/t ongoing dysuria.

## 2021-09-10 NOTE — ED Provider Notes (Signed)
[facility] DEPT Provider Note  CSN: [id number] Arrival date & time: 09/09/21 2351  Chief Complaint(s) Abdominal Pain  HPI Amanda Frost is a 71 y.o. female with a past medical history listed below including paroxysmal A-fib on Eliquis who presents to the emergency department with 2 days of fluctuating abdominal pain.  Tonight's episode was the most severe.  Described as a twisting sensation in the left lower quadrant and suprapubic region.  She reports that she is currently taking Keflex for a presumed urinary tract infection.  She denies any associated nausea or vomiting.  She does reports feeling constipated.  States that she feels like she has to have a bowel movement but is unable to go.  The history is provided by the patient.    Past Medical History Past Medical History:  Diagnosis Date   Hypertension    Liver cirrhosis secondary to NASH (nonalcoholic steatohepatitis) ([place])    Ocular migraine    Palpitations    PAC's,PVC's,atrial tachycardia   Paroxysmal atrial fibrillation [facility])    Patient Active Problem List   Diagnosis Date Noted   Secondary hypercoagulable state ([place]) 09/04/2021   Migraine aura without headache 10/19/2020   Cervico-occipital neuralgia of right side 11/18/2018   Palpitations 10/15/2017   Chronic anticoagulation 12/05/2016   Paroxysmal atrial fibrillation ([place]) 01/21/2014   Liver cirrhosis secondary to NASH ([place]) 01/21/2014   Essential hypertension 10/29/2012   Paroxysmal atrial tachycardia ([place]) 10/29/2012   Home Medication(s) Prior to Admission medications   Medication Sig Start Date End Date Taking? Authorizing Provider  adapalene (DIFFERIN) 0.1 % cream Apply 1 application. topically daily as needed (acne).    [provider]  apixaban (ELIQUIS) 5 MG TABS tablet Take 1 tablet (5 mg total) by mouth 2 (two) times daily. 10/25/20   Croitoru, Dani Gobble, MD  Cholecalciferol (VITAMIN D3) 5000 units CAPS Take  10,000 Units by mouth every Monday, Tuesday, Wednesday, Thursday, and Friday.    [provider]  diltiazem (CARDIZEM) 30 MG tablet Take 1 tablet (30 mg total) by mouth 4 (four) times daily as needed (PALPITATIONS HEARTRATE HIGHER THAN 120 BPM). 08/31/21   Baldwin Jamaica, PA-C  diphenhydrAMINE HCl (ALLERGY MED PO) Take 1 tablet by mouth daily as needed (allergies).    [provider]  metoprolol succinate (TOPROL-XL) 50 MG 24 hr tablet Taking 1.5 tablet by mouth twice daily 08/02/21   Sherran Needs, NP                                                                                                                                    Allergies Ciprofloxacin, Macrodantin [nitrofurantoin macrocrystal], Sulfa antibiotics, Atorvastatin, and Epinephrine  Review of Systems Review of Systems As noted in HPI  Physical Exam Vital Signs  I have reviewed the triage vital signs BP (!) 172/98   Pulse 80   Temp 98.7 F (37.1 C) (Oral)   Resp 20  Ht '5\' 8"'$  (1.727 m)   Wt 76.2 kg   SpO2 97%   BMI 25.54 kg/m   Physical Exam Vitals reviewed.  Constitutional:      General: She is not in acute distress.    Appearance: She is well-developed. She is not diaphoretic.  HENT:     Head: Normocephalic and atraumatic.     Right Ear: External ear normal.     Left Ear: External ear normal.     Nose: Nose normal.  Eyes:     General: No scleral icterus.    Conjunctiva/sclera: Conjunctivae normal.  Neck:     Trachea: Phonation normal.  Cardiovascular:     Rate and Rhythm: Normal rate and regular rhythm.  Pulmonary:     Effort: Pulmonary effort is normal. No respiratory distress.     Breath sounds: No stridor.  Abdominal:     General: There is no distension.     Tenderness: There is abdominal tenderness (pressure) in the periumbilical area, suprapubic area and left lower quadrant. There is no guarding or rebound.  Musculoskeletal:        General: Normal range of motion.     Cervical  back: Normal range of motion.  Neurological:     Mental Status: She is alert and oriented to person, place, and time.  Psychiatric:        Behavior: Behavior normal.     ED Results and Treatments Labs (all labs ordered are listed, but only abnormal results are displayed) Labs Reviewed  URINALYSIS, ROUTINE W REFLEX MICROSCOPIC - Abnormal; Notable for the following components:      Result Value   Hgb urine dipstick LARGE (*)    Leukocytes,Ua TRACE (*)    Bacteria, UA RARE (*)    All other components within normal limits  COMPREHENSIVE METABOLIC PANEL - Abnormal; Notable for the following components:   Glucose, Bld 155 (*)    Calcium 10.5 (*)    AST 56 (*)    ALT 64 (*)    Total Bilirubin 1.3 (*)    All other components within normal limits  CBC WITH DIFFERENTIAL/PLATELET  LIPASE, BLOOD                                                                                                                         EKG  EKG Interpretation  Date/Time:    Ventricular Rate:    PR Interval:    QRS Duration:   QT Interval:    QTC Calculation:   R Axis:     Text Interpretation:         Radiology DG ABD ACUTE 2+V W 1V CHEST  Result Date: 09/10/2021 CLINICAL DATA:  Lower abdominal pain.  UTI. EXAM: DG ABDOMEN ACUTE WITH 1 VIEW CHEST COMPARISON:  None Available. FINDINGS: There is no evidence of dilated bowel loops or free intraperitoneal air. No radiopaque calculi. Mild degenerative changes are present in the lumbar spine. Heart size and mediastinal contours are within normal  limits. Mild hyperinflation of the lungs is noted. No consolidation, effusion, or pneumothorax. IMPRESSION: Negative abdominal radiographs.  No acute cardiopulmonary disease. Electronically Signed   By: Brett Fairy M.D.   On: 09/10/2021 01:05    Pertinent labs & imaging results that were available during my care of the patient were reviewed by me and considered in my medical decision making (see MDM for  details).  Medications Ordered in ED Medications  alum & mag hydroxide-simeth (MAALOX/MYLANTA) 200-200-20 MG/5ML suspension 30 mL (30 mLs Oral Given 09/10/21 0043)    And  lidocaine (XYLOCAINE) 2 % viscous mouth solution 15 mL (15 mLs Oral Given 09/10/21 0043)  hyoscyamine (LEVSIN SL) SL tablet 0.25 mg (0.25 mg Sublingual Given 09/10/21 0042)  sodium chloride 0.9 % bolus 1,000 mL (0 mLs Intravenous Stopped 09/10/21 0245)                                                                                                                                     Procedures Procedures  (including critical care time)  Medical Decision Making / ED Course    Complexity of Problem:  Co-morbidities/SDOH that complicate the patient evaluation/care: Noted above in HPI  Patient's presenting problem/concern, DDX, and MDM listed below: Abdominal discomfort Considering functional intestinal cramping, constipation, renal colic.  Will assess for evidence of intra-abdominal infectious process/bowel obstruction.  Initial Intervention:  IVF and GI cocktail    Complexity of Data:   Cardiac Monitoring: The patient was maintained on a cardiac monitor.   I personally viewed and interpreted the cardiac monitored which showed an underlying rhythm of normal sinus rhythm with rates in the 80s to 90s.  Laboratory Tests ordered listed below with my independent interpretation: CBC without leukocytosis or anemia Patient has mild hypocalcemia.  No other significant electrolyte derangements or renal sufficiency.  Mildly elevated LFTs.  No pancreatitis. UA with hematuria.  Not overtly concerning for UTI   Imaging Studies ordered listed below with my independent interpretation: Acute abdominal series did not reveal evidence of small bowel obstruction but did reveal dilated stomach versus transverse colon.  Unfortunately at this time a night we do not have CT scan due to it being [place] [facility].     ED Course:     Assessment, Add'l Intervention, and Reassessment: Abdominal discomfort We will need to obtain a CT scan to rule out incomplete bowel obstruction versus renal stone I spoke with Dr. Dolly Rias from [facility] emergency department who accepted transfer for imaging.    Final Clinical Impression(s) / ED Diagnoses Final diagnoses:  Abdominal cramping           This chart was dictated using voice recognition software.  Despite best efforts to proofread,  errors can occur which can change the documentation meaning.    Fatima Blank, MD 09/10/21 [phone]

## 2021-09-11 ENCOUNTER — Telehealth: Payer: Self-pay | Admitting: Cardiovascular Disease

## 2021-09-11 NOTE — Telephone Encounter (Signed)
Patient takes metoprolol succinate 75 mg by mouth twice daily. Patient wants to know how to take her metoprolol tartrate 25 mg as needed for palpitations. Patient stated she was given her diltazem 30 mg to take for HR over 120. Patient stated she does not feel comfortable with taking the diltiazem because her BP drops so quickly along with her HR in the ER. Patient stated that her metoprolol was taken off her list due to her not taking it, but would like to try to take it as needed for palpitations. Will forward to Tommye Standard PA to see if it is okay for her to take as needed.

## 2021-09-11 NOTE — Telephone Encounter (Signed)
Pt c/o medication issue:  1. Name of Medication: metoprolol tartrate  2. How are you currently taking this medication (dosage and times per day)?   3. Are you having a reaction (difficulty breathing--STAT)? No  4. What is your medication issue? Needing advice in regards to this medication. Was not given clear instructions as to when to take this medication, or if she should take it with other medications.

## 2021-09-12 ENCOUNTER — Other Ambulatory Visit: Payer: Self-pay | Admitting: *Deleted

## 2021-09-12 MED ORDER — METOPROLOL TARTRATE 25 MG PO TABS: 25.0000 mg | ORAL_TABLET | ORAL | 3 refills | Status: DC | PRN

## 2021-09-12 NOTE — Telephone Encounter (Signed)
Spoke with patient who currently out of town about Metoprolol tartrate 25 mg as needed okay to use as PRN for palpitations.

## 2021-09-13 ENCOUNTER — Other Ambulatory Visit: Payer: PPO

## 2021-09-29 DIAGNOSIS — I480 Paroxysmal atrial fibrillation: Secondary | ICD-10-CM | POA: Diagnosis not present

## 2021-09-29 DIAGNOSIS — I10 Essential (primary) hypertension: Secondary | ICD-10-CM | POA: Diagnosis not present

## 2021-09-29 DIAGNOSIS — E1169 Type 2 diabetes mellitus with other specified complication: Secondary | ICD-10-CM | POA: Diagnosis not present

## 2021-09-29 DIAGNOSIS — E785 Hyperlipidemia, unspecified: Secondary | ICD-10-CM | POA: Diagnosis not present

## 2021-10-04 ENCOUNTER — Other Ambulatory Visit: Payer: PPO

## 2021-10-11 ENCOUNTER — Ambulatory Visit: Payer: PPO | Admitting: Cardiology

## 2021-10-11 ENCOUNTER — Encounter: Payer: Self-pay | Admitting: Cardiology

## 2021-10-11 VITALS — BP 156/80 | HR 75 | Ht 68.0 in | Wt 172.4 lb

## 2021-10-11 DIAGNOSIS — I4819 Other persistent atrial fibrillation: Secondary | ICD-10-CM

## 2021-10-11 DIAGNOSIS — D6869 Other thrombophilia: Secondary | ICD-10-CM | POA: Diagnosis not present

## 2021-10-11 NOTE — Progress Notes (Signed)
Electrophysiology Office Note   Date:  10/11/2021   ID:  Amanda Frost, DOB Sep 26, 1950, MRN [id number]  PCP:  Crist Infante, MD  Cardiologist:  Amanda Frost Primary Electrophysiologist:  Twan Harkin Meredith Leeds, MD    Chief Complaint: palpitations   History of Present Illness: Amanda Frost is a 71 y.o. female who is being seen today for the evaluation of palpitations at the request of Crist Infante, MD. Presenting today for electrophysiology evaluation.  She has a history significant for paroxysmal atrial fibrillation, atrial tachycardia, hypertension, cirrhosis secondary to nonalcoholic steatohepatitis.  She has had multiple cardioversions for her atrial fibrillation.  She is now status post ablation 07/13/2021.  After ablation, she continued to have episodes of atrial fibrillation times rapid, requiring cardioversion in the emergency room.  She was offered repeat ablation versus management with flecainide, but had remained in sinus rhythm for the last month and a half.  Today, denies symptoms of palpitations, chest pain, shortness of breath, orthopnea, PND, lower extremity edema, claudication, dizziness, presyncope, syncope, bleeding, or neurologic sequela. The patient is tolerating medications without difficulties.  Since her ablation, she has had a few episodes of atrial fibrillation.  Fortunately she has been feeling well over the last month.  She is overall happy with her control.  I have told her that she goes back into atrial fibrillation, she would potentially be a candidate for repeat ablation.    Past Medical History:  Diagnosis Date   Hypertension    Liver cirrhosis secondary to NASH (nonalcoholic steatohepatitis) ([place])    Ocular migraine    Palpitations    PAC's,PVC's,atrial tachycardia   Paroxysmal atrial fibrillation ([facility])    Past Surgical History:  Procedure Laterality Date   ATRIAL FIBRILLATION ABLATION N/A 07/12/2021   Procedure: ATRIAL FIBRILLATION ABLATION;   Surgeon: Constance Haw, MD;  Location: [facility];  Service: Cardiovascular;  Laterality: N/A;   BREAST BIOPSY  1994   CARDIAC CATHETERIZATION  11/05/2007   normal LV fx,patent coronary arteries   NM MYOVIEW LTD  03/12/2005   normal   US ECHOCARDIOGRAPHY  11/26/2007   mild LVH,trace MR,TR     Current Outpatient Medications  Medication Sig Dispense Refill   adapalene (DIFFERIN) 0.1 % cream Apply 1 application. topically daily as needed (acne).     apixaban (ELIQUIS) 5 MG TABS tablet Take 1 tablet (5 mg total) by mouth 2 (two) times daily. 180 tablet 1   Cholecalciferol (VITAMIN D3) 5000 units CAPS Take 10,000 Units by mouth every Monday, Tuesday, Wednesday, Thursday, and Friday.     metoprolol succinate (TOPROL-XL) 50 MG 24 hr tablet Taking 1.5 tablet by mouth twice daily 90 tablet 3   diltiazem (CARDIZEM) 30 MG tablet Take 1 tablet (30 mg total) by mouth 4 (four) times daily as needed (PALPITATIONS HEARTRATE HIGHER THAN 120 BPM). (Patient not taking: Reported on 10/11/2021) 90 tablet 0   diphenhydrAMINE HCl (ALLERGY MED PO) Take 1 tablet by mouth daily as needed (allergies). (Patient not taking: Reported on 10/11/2021)     meloxicam (MOBIC) 15 MG tablet Take 1 tablet (15 mg total) by mouth daily. (Patient not taking: Reported on 10/11/2021) 15 tablet 0   metoprolol tartrate (LOPRESSOR) 25 MG tablet Take 1 tablet (25 mg total) by mouth as needed (For Palpitations). (Patient not taking: Reported on 10/11/2021) 30 tablet 3   ondansetron (ZOFRAN-ODT) 4 MG disintegrating tablet '4mg'$  ODT q4 hours prn nausea/vomit (Patient not taking: Reported on 10/11/2021) 30 tablet 0   oxyCODONE-acetaminophen (  PERCOCET) 5-325 MG tablet Take 1-2 tablets by mouth every 4 (four) hours as needed for severe pain. (Patient not taking: Reported on 10/11/2021) 20 tablet 0   tamsulosin (FLOMAX) 0.4 MG CAPS capsule Take 1 capsule (0.4 mg total) by mouth daily. Until stone passes (Patient not taking: Reported on  10/11/2021) 30 capsule 0   No current facility-administered medications for this visit.    Allergies:   Ciprofloxacin, Macrodantin [nitrofurantoin macrocrystal], Sulfa antibiotics, Atorvastatin, and Epinephrine   Social History:  The patient  reports that she has never smoked. She has never used smokeless tobacco. She reports that she does not drink alcohol and does not use drugs.   Family History:  The patient's family history includes Diabetes in her father and mother; Hypertension in her father and mother; Kidney disease in her mother; Migraines in her maternal grandmother, son, and son.   ROS:  Please see the history of present illness.   Otherwise, review of systems is positive for none.   All other systems are reviewed and negative.   PHYSICAL EXAM: VS:  BP (!) 156/80   Pulse 75   Ht '5\' 8"'$  (1.727 m)   Wt 172 lb 6.4 oz (78.2 kg)   SpO2 95%   BMI 26.21 kg/m  , BMI Body mass index is 26.21 kg/m. GEN: Well nourished, well developed, in no acute distress  HEENT: normal  Neck: no JVD, carotid bruits, or masses Cardiac: RRR; no murmurs, rubs, or gallops,no edema  Respiratory:  clear to auscultation bilaterally, normal work of breathing GI: soft, nontender, nondistended, + BS MS: no deformity or atrophy  Skin: warm and dry Neuro:  Strength and sensation are intact Psych: euthymic mood, full affect  EKG:  EKG is ordered today. Personal review of the ekg ordered shows sinus rhythm   Recent Labs: 08/02/2021: TSH 2.223 08/30/2021: Magnesium 1.8 09/10/2021: ALT 64; BUN 14; Creatinine, Ser 0.97; Hemoglobin 13.3; Platelets 311; Potassium 3.8; Sodium 138    Lipid Panel     Component Value Date/Time   CHOL  11/05/2007 0821    136        ATP III CLASSIFICATION:  <200     mg/dL   Desirable  200-239  mg/dL   Borderline High  >=240    mg/dL   High   TRIG 145 11/05/2007 0821   HDL 36 (L) 11/05/2007 0821   CHOLHDL 3.8 11/05/2007 0821   VLDL 29 11/05/2007 0821   LDLCALC  11/05/2007  0821    71        Total Cholesterol/HDL:CHD Risk Coronary Heart Disease Risk Table                     Men   Women  1/2 Average Risk   3.4   3.3     Wt Readings from Last 3 Encounters:  10/11/21 172 lb 6.4 oz (78.2 kg)  09/10/21 168 lb (76.2 kg)  09/04/21 171 lb 6.4 oz (77.7 kg)      Other studies Reviewed: Additional studies/ records that were reviewed today include: Monitor 10/08/19  Review of the above records today demonstrates:  The dominant rhythm is normal sinus with normal circadian variation. There are rare episodes of very brief nonsustained ventricular tachycardia, 3-5 beats. There are rare episodes of brief nonsustained atrial tachycardia, 3-7 beats. The patient triggered recordings are almost all associated with abnormal rhythm, usually isolated PACs and PVCs No significant episodes of bradycardia or pauses are recorded. There is no atrial  fibrillation.    ASSESSMENT AND PLAN:  1.  Paroxysmal atrial fibrillation/atrial tachycardia: Currently on Eliquis 5 mg twice daily, Toprol-XL 50 mg daily.  CHA2DS2-VASc of 3.  Has had multiple emergency room visits for atrial fibrillation.  She is now status post ablation 07/13/2021.  She had episodes of atrial fibrillation post ablation requiring cardioversion, but has not had any further episodes in the last month and a half.  She is overall happy with her control.  We Tryce Surratt continue with current management.  2.  Hypertension: Currently well controlled at home.  Elevated today.  No changes.  3.  Secondary hypercoagulable state: Currently on Eliquis for atrial fibrillation as above  Current medicines are reviewed at length with the patient today.   The patient does not have concerns regarding her medicines.  The following changes were made today: None  Labs/ tests ordered today include:  Orders Placed This Encounter  Procedures   EKG 12-Lead     Disposition:   FU with Johnasia Liese 3 months  Signed, Simren Popson Meredith Leeds, MD   10/11/2021 8:50 AM     [address] [place] [address] [phone] (office) [phone] (fax)

## 2021-10-11 NOTE — Patient Instructions (Signed)
Medication Instructions:  Your physician recommends that you continue on your current medications as directed. Please refer to the Current Medication list given to you today.  *If you need a refill on your cardiac medications before your next appointment, please call your pharmacy*   Lab Work: None ordered If you have labs (blood work) drawn today and your tests are completely normal, you will receive your results only by: [facility] (if you have MyChart) OR A paper copy in the mail If you have any lab test that is abnormal or we need to change your treatment, we will call you to review the results.   Testing/Procedures: None ordered   Follow-Up: At [facility], you and your health needs are our priority.  As part of our continuing mission to provide you with exceptional heart care, we have created designated Provider Care Teams.  These Care Teams include your primary Cardiologist (physician) and Advanced Practice Providers (APPs -  Physician Assistants and Nurse Practitioners) who all work together to provide you with the care you need, when you need it.  We recommend signing up for the patient portal called "MyChart".  Sign up information is provided on this After Visit Summary.  MyChart is used to connect with patients for Virtual Visits (Telemedicine).  Patients are able to view lab/test results, encounter notes, upcoming appointments, etc.  Non-urgent messages can be sent to your provider as well.   To learn more about what you can do with MyChart, go to [email or web address].    Your next appointment:   3 month(s)  The format for your next appointment:   In Person  Provider:   Allegra Lai, MD    Thank you for choosing [facility]!!   Trinidad Curet, RN [phone]  Other Instructions   Important Information About Sugar

## 2021-10-17 DIAGNOSIS — Z1231 Encounter for screening mammogram for malignant neoplasm of breast: Secondary | ICD-10-CM | POA: Diagnosis not present

## 2021-10-30 DIAGNOSIS — I10 Essential (primary) hypertension: Secondary | ICD-10-CM | POA: Diagnosis not present

## 2021-10-30 DIAGNOSIS — E785 Hyperlipidemia, unspecified: Secondary | ICD-10-CM | POA: Diagnosis not present

## 2021-11-01 ENCOUNTER — Other Ambulatory Visit (HOSPITAL_COMMUNITY): Payer: Self-pay | Admitting: Cardiovascular Disease

## 2021-11-04 ENCOUNTER — Other Ambulatory Visit (HOSPITAL_COMMUNITY): Payer: Self-pay | Admitting: Cardiovascular Disease

## 2021-11-06 NOTE — Telephone Encounter (Signed)
Prescription refill request for Amanda Frost received. Indication: Atrial Fib Last office visit: 10/11/21  Elliot Cousin MD Scr: 0.97 on 09/10/21 Age: 71 Weight: 78.2kg  Based on above findings Amanda Frost '5mg'$  twice daily is the appropriate dose.  Refill approved.

## 2021-12-01 DIAGNOSIS — D6869 Other thrombophilia: Secondary | ICD-10-CM | POA: Diagnosis not present

## 2021-12-01 DIAGNOSIS — I480 Paroxysmal atrial fibrillation: Secondary | ICD-10-CM | POA: Diagnosis not present

## 2021-12-01 DIAGNOSIS — I10 Essential (primary) hypertension: Secondary | ICD-10-CM | POA: Diagnosis not present

## 2021-12-01 DIAGNOSIS — E1169 Type 2 diabetes mellitus with other specified complication: Secondary | ICD-10-CM | POA: Diagnosis not present

## 2021-12-01 DIAGNOSIS — T148XXA Other injury of unspecified body region, initial encounter: Secondary | ICD-10-CM | POA: Diagnosis not present

## 2021-12-05 ENCOUNTER — Other Ambulatory Visit: Payer: PPO

## 2021-12-05 DIAGNOSIS — J0100 Acute maxillary sinusitis, unspecified: Secondary | ICD-10-CM | POA: Diagnosis not present

## 2021-12-05 DIAGNOSIS — R059 Cough, unspecified: Secondary | ICD-10-CM | POA: Diagnosis not present

## 2021-12-05 DIAGNOSIS — Z1152 Encounter for screening for COVID-19: Secondary | ICD-10-CM | POA: Diagnosis not present

## 2021-12-05 DIAGNOSIS — R0981 Nasal congestion: Secondary | ICD-10-CM | POA: Diagnosis not present

## 2021-12-05 DIAGNOSIS — R5383 Other fatigue: Secondary | ICD-10-CM | POA: Diagnosis not present

## 2021-12-06 ENCOUNTER — Other Ambulatory Visit: Payer: PPO

## 2021-12-27 ENCOUNTER — Ambulatory Visit
Admission: RE | Admit: 2021-12-27 | Discharge: 2021-12-27 | Disposition: A | Discharge status: Home / Self Care | Payer: PPO | Source: Ambulatory Visit | Attending: Internal Medicine | Admitting: Internal Medicine

## 2021-12-27 DIAGNOSIS — R911 Solitary pulmonary nodule: Secondary | ICD-10-CM

## 2021-12-27 DIAGNOSIS — I700 Atherosclerosis of aorta: Secondary | ICD-10-CM | POA: Diagnosis not present

## 2021-12-27 DIAGNOSIS — R918 Other nonspecific abnormal finding of lung field: Secondary | ICD-10-CM | POA: Diagnosis not present

## 2021-12-29 ENCOUNTER — Other Ambulatory Visit (HOSPITAL_BASED_OUTPATIENT_CLINIC_OR_DEPARTMENT_OTHER): Payer: Self-pay | Admitting: Internal Medicine

## 2021-12-29 DIAGNOSIS — L905 Scar conditions and fibrosis of skin: Secondary | ICD-10-CM

## 2022-01-02 ENCOUNTER — Telehealth: Payer: Self-pay | Admitting: Cardiology

## 2022-01-02 NOTE — Telephone Encounter (Signed)
I spoke with patient and explained when she could use metoprolol tartrate

## 2022-01-02 NOTE — Telephone Encounter (Signed)
Pt c/o medication issue:  1. Name of Medication:  metoprolol tartrate (LOPRESSOR) 25 MG tablet   2. How are you currently taking this medication (dosage and times per day)?   3. Are you having a reaction (difficulty breathing--STAT)?   4. What is your medication issue?   Patient states she has never taken Metoprolol Tartrate and she just wants to review instructions--specifically, she would like to clarify parameters for palpitations and when she should be taking it.

## 2022-01-04 DIAGNOSIS — I10 Essential (primary) hypertension: Secondary | ICD-10-CM | POA: Diagnosis not present

## 2022-01-04 DIAGNOSIS — E1169 Type 2 diabetes mellitus with other specified complication: Secondary | ICD-10-CM | POA: Diagnosis not present

## 2022-01-04 DIAGNOSIS — E785 Hyperlipidemia, unspecified: Secondary | ICD-10-CM | POA: Diagnosis not present

## 2022-01-04 DIAGNOSIS — E119 Type 2 diabetes mellitus without complications: Secondary | ICD-10-CM | POA: Diagnosis not present

## 2022-01-06 ENCOUNTER — Ambulatory Visit (HOSPITAL_BASED_OUTPATIENT_CLINIC_OR_DEPARTMENT_OTHER)
Admission: RE | Admit: 2022-01-06 | Discharge: 2022-01-06 | Disposition: A | Discharge status: Home / Self Care | Payer: PPO | Source: Ambulatory Visit | Attending: Internal Medicine | Admitting: Internal Medicine

## 2022-01-06 DIAGNOSIS — L905 Scar conditions and fibrosis of skin: Secondary | ICD-10-CM | POA: Diagnosis not present

## 2022-01-06 DIAGNOSIS — R053 Chronic cough: Secondary | ICD-10-CM | POA: Insufficient documentation

## 2022-01-06 DIAGNOSIS — R911 Solitary pulmonary nodule: Secondary | ICD-10-CM | POA: Diagnosis not present

## 2022-01-06 DIAGNOSIS — R059 Cough, unspecified: Secondary | ICD-10-CM | POA: Diagnosis not present

## 2022-01-06 DIAGNOSIS — I700 Atherosclerosis of aorta: Secondary | ICD-10-CM | POA: Insufficient documentation

## 2022-01-06 DIAGNOSIS — R918 Other nonspecific abnormal finding of lung field: Secondary | ICD-10-CM | POA: Insufficient documentation

## 2022-01-06 DIAGNOSIS — J40 Bronchitis, not specified as acute or chronic: Secondary | ICD-10-CM | POA: Insufficient documentation

## 2022-01-16 ENCOUNTER — Encounter: Payer: Self-pay | Admitting: Cardiology

## 2022-01-16 ENCOUNTER — Ambulatory Visit: Payer: PPO | Attending: Cardiology | Admitting: Cardiology

## 2022-01-16 VITALS — BP 136/80 | HR 71 | Ht 68.0 in | Wt 172.0 lb

## 2022-01-16 DIAGNOSIS — I10 Essential (primary) hypertension: Secondary | ICD-10-CM | POA: Diagnosis not present

## 2022-01-16 DIAGNOSIS — I480 Paroxysmal atrial fibrillation: Secondary | ICD-10-CM | POA: Diagnosis not present

## 2022-01-16 DIAGNOSIS — D6869 Other thrombophilia: Secondary | ICD-10-CM | POA: Diagnosis not present

## 2022-01-16 NOTE — Progress Notes (Signed)
Electrophysiology Office Note   Date:  01/16/2022   ID:  EDA MAGNUSSEN, DOB [birth year], MRN [id number]  PCP:  Crist Infante, MD  Cardiologist:  [place] Primary Electrophysiologist:  Angala Hilgers Meredith Leeds, MD    Chief Complaint: palpitations   History of Present Illness: Amanda Frost is a 71 y.o. female who is being seen today for the evaluation of palpitations at the request of Crist Infante, MD. Presenting today for electrophysiology evaluation.  She has a history seen for paroxysmal atrial fibrillation, atrial tachycardia, hypertension, cirrhosis secondary to nonalcoholic steatohepatitis.  She has had multiple cardioversions for atrial fibrillation.  She is post ablation 07/13/2021.  After ablation she had episodes of atrial fibrillation requiring emergency room cardioversion.  Today, denies symptoms of palpitations, chest pain, shortness of breath, orthopnea, PND, lower extremity edema, claudication, dizziness, presyncope, syncope, bleeding, or neurologic sequela. The patient is tolerating medications without difficulties.  Since being seen she has done well.  She continues to have short episodes of palpitations that last a few seconds at a time.  They occur once every few weeks.  She has not had to take any of her as needed medications.   Past Medical History:  Diagnosis Date   Hypertension    Liver cirrhosis secondary to NASH (nonalcoholic steatohepatitis) ([place])    Ocular migraine    Palpitations    PAC's,PVC's,atrial tachycardia   Paroxysmal atrial fibrillation (HCC)    Past Surgical History:  Procedure Laterality Date   ATRIAL FIBRILLATION ABLATION N/A 07/12/2021   Procedure: ATRIAL FIBRILLATION ABLATION;  Surgeon: Constance Haw, MD;  Location: [facility];  Service: Cardiovascular;  Laterality: N/A;   BREAST BIOPSY  1994   CARDIAC CATHETERIZATION  11/05/2007   normal LV fx,patent coronary arteries   NM MYOVIEW LTD  03/12/2005   normal   US  ECHOCARDIOGRAPHY  11/26/2007   mild LVH,trace MR,TR     Current Outpatient Medications  Medication Sig Dispense Refill   adapalene (DIFFERIN) 0.1 % cream Apply 1 application. topically daily as needed (acne).     apixaban (ELIQUIS) 5 MG TABS tablet TAKE 1 TABLET BY MOUTH TWICE A DAY 60 tablet 5   Cholecalciferol (VITAMIN D3) 5000 units CAPS Take 10,000 Units by mouth every Monday, Tuesday, Wednesday, Thursday, and Friday.     meloxicam (MOBIC) 15 MG tablet Take 1 tablet (15 mg total) by mouth daily. 15 tablet 0   metoprolol succinate (TOPROL-XL) 50 MG 24 hr tablet TAKING 1.5 TABLET BY MOUTH TWICE DAILY 270 tablet 3   olmesartan (BENICAR) 20 MG tablet Take by mouth.     diltiazem (CARDIZEM) 30 MG tablet Take 1 tablet (30 mg total) by mouth 4 (four) times daily as needed (PALPITATIONS HEARTRATE HIGHER THAN 120 BPM). (Patient not taking: Reported on 01/16/2022) 90 tablet 0   metoprolol tartrate (LOPRESSOR) 25 MG tablet Take 1 tablet (25 mg total) by mouth as needed (For Palpitations). (Patient not taking: Reported on 10/11/2021) 30 tablet 3   No current facility-administered medications for this visit.    Allergies:   Ciprofloxacin, Macrodantin [nitrofurantoin macrocrystal], Sulfa antibiotics, Atorvastatin, and Epinephrine   Social History:  The patient  reports that she has never smoked. She has never used smokeless tobacco. She reports that she does not drink alcohol and does not use drugs.   Family History:  The patient's family history includes Diabetes in her father and mother; Hypertension in her father and mother; Kidney disease in her mother; Migraines in her maternal grandmother,  son, and son.   ROS:  Please see the history of present illness.   Otherwise, review of systems is positive for none.   All other systems are reviewed and negative.   PHYSICAL EXAM: VS:  BP 136/80   Pulse 71   Ht '5\' 8"'$  (1.727 m)   Wt 172 lb (78 kg)   SpO2 96%   BMI 26.15 kg/m  , BMI Body mass index is  26.15 kg/m. GEN: Well nourished, well developed, in no acute distress  HEENT: normal  Neck: no JVD, carotid bruits, or masses Cardiac: RRR; no murmurs, rubs, or gallops,no edema  Respiratory:  clear to auscultation bilaterally, normal work of breathing GI: soft, nontender, nondistended, + BS MS: no deformity or atrophy  Skin: warm and dry Neuro:  Strength and sensation are intact Psych: euthymic mood, full affect  EKG:  EKG is ordered today. Personal review of the ekg ordered shows sinus rhythm, rate 71  Recent Labs: 08/02/2021: TSH 2.223 08/30/2021: Magnesium 1.8 09/10/2021: ALT 64; BUN 14; Creatinine, Ser 0.97; Hemoglobin 13.3; Platelets 311; Potassium 3.8; Sodium 138    Lipid Panel     Component Value Date/Time   CHOL  11/05/2007 0821    136        ATP III CLASSIFICATION:  <200     mg/dL   Desirable  200-239  mg/dL   Borderline High  >=240    mg/dL   High   TRIG 145 11/05/2007 0821   HDL 36 (L) 11/05/2007 0821   CHOLHDL 3.8 11/05/2007 0821   VLDL 29 11/05/2007 0821   LDLCALC  11/05/2007 0821    71        Total Cholesterol/HDL:CHD Risk Coronary Heart Disease Risk Table                     Men   Women  1/2 Average Risk   3.4   3.3     Wt Readings from Last 3 Encounters:  01/16/22 172 lb (78 kg)  10/11/21 172 lb 6.4 oz (78.2 kg)  09/10/21 168 lb (76.2 kg)      Other studies Reviewed: Additional studies/ records that were reviewed today include: Monitor 10/08/19  Review of the above records today demonstrates:  The dominant rhythm is normal sinus with normal circadian variation. There are rare episodes of very brief nonsustained ventricular tachycardia, 3-5 beats. There are rare episodes of brief nonsustained atrial tachycardia, 3-7 beats. The patient triggered recordings are almost all associated with abnormal rhythm, usually isolated PACs and PVCs No significant episodes of bradycardia or pauses are recorded. There is no atrial fibrillation.    ASSESSMENT AND  PLAN:  1.  Paroxysmal atrial fibrillation/atrial tachycardia: Currently on Eliquis 5 mg twice daily, Toprol-XL 50 mg daily.  CHA2DS2-VASc of 3.  Status post ablation 07/13/2021.  He continued to have short episodes of palpitations that last a few seconds at a time.  Otherwise, she is doing well.  She is overall happy with her control.  No changes.  2.  Hypertension: Initially but well controlled on recheck.  No changes.  3.  Secondary to coagula state: Currently on Eliquis for atrial fibrillation as above.   Current medicines are reviewed at length with the patient today.   The patient does not have concerns regarding her medicines.  The following changes were made today: None  Labs/ tests ordered today include:  Orders Placed This Encounter  Procedures   EKG 12-Lead     Disposition:  FU 6 months  Signed, Asa Fath Meredith Leeds, MD  01/16/2022 9:43 AM     [facility] [address] [place] [address] [phone] (office) [phone] (fax)

## 2022-01-19 ENCOUNTER — Ambulatory Visit: Payer: PPO | Admitting: Cardiology

## 2022-03-29 DIAGNOSIS — L309 Dermatitis, unspecified: Secondary | ICD-10-CM | POA: Diagnosis not present

## 2022-03-29 DIAGNOSIS — Z85828 Personal history of other malignant neoplasm of skin: Secondary | ICD-10-CM | POA: Diagnosis not present

## 2022-04-04 DIAGNOSIS — I10 Essential (primary) hypertension: Secondary | ICD-10-CM | POA: Diagnosis not present

## 2022-04-04 DIAGNOSIS — R42 Dizziness and giddiness: Secondary | ICD-10-CM | POA: Diagnosis not present

## 2022-04-18 ENCOUNTER — Telehealth: Payer: Self-pay | Admitting: Cardiology

## 2022-04-18 NOTE — Telephone Encounter (Signed)
Patient c/o Palpitations:  High priority if patient c/o lightheadedness, shortness of breath, or chest pain  How long have you had palpitations/irregular HR/ Afib? Are you having the symptoms now? yes  Are you currently experiencing lightheadedness, SOB or CP? Little CP  Do you have a history of afib (atrial fibrillation) or irregular heart rhythm? yes  Have you checked your BP or HR? (document readings if available): 130/85  Are you experiencing any other symptoms? No

## 2022-04-18 NOTE — Telephone Encounter (Signed)
Pt called to let Dr. Curt Bears know that she has been having more than her usual palpitations the last few days... no dizziness, SOB, or chest pain.   She says they are random and very short/quick... feels like a "skipped" heart beat.   She has not had to take any extra metoprolol or Diltiazem... her BP has been around 130/85. Hr 70's.   No new OTC meds... no recent illness and she has been eating and drinking well.   She will continue to monitor and let us know if anything worsens... I will forward to Dr Curt Bears for his review.   Afib Ablation 07/2021

## 2022-04-26 ENCOUNTER — Encounter: Payer: Self-pay | Admitting: Cardiovascular Disease

## 2022-04-26 NOTE — Telephone Encounter (Signed)
Pt states she would like a call back for an update. She would like to make sure she doesn't need to be seen for a sooner appt. Please advise.  SOB has developed a few days ago, but states that it is off and on and may be anxiety related, but did want to speak to someone in regards to this, as well.

## 2022-04-26 NOTE — Telephone Encounter (Signed)
Spoke with pt, she has noticed in the last 1-2 weeks an increase in palpitations. She reports she can have these up to 5 times in one day and they are slowly getting less and less. She will feel an occasional skipped beat and then will feel a vibrating sensation that will last only a few seconds. She reports this is nothing like the feelings of atrial fib she has had. Looking back at the monitor she wore in 2016, she had pvc's and pac's and atrial tach. She has metoprolol tartrate that she can use but they are only lasting a couple seconds so she has not taken any. She is anxious and feels that maybe causing the extra beats as well. Reassurance given to the patient and she will mention this at her follow up appointment 05/02/22. Her SOB comes off and on and she is not really worried about it. She does not get SOB with the palpitations.

## 2022-04-26 NOTE — Telephone Encounter (Signed)
Error

## 2022-04-30 DIAGNOSIS — I700 Atherosclerosis of aorta: Secondary | ICD-10-CM | POA: Diagnosis not present

## 2022-04-30 DIAGNOSIS — J45998 Other asthma: Secondary | ICD-10-CM | POA: Diagnosis not present

## 2022-04-30 DIAGNOSIS — D6869 Other thrombophilia: Secondary | ICD-10-CM | POA: Diagnosis not present

## 2022-04-30 DIAGNOSIS — I10 Essential (primary) hypertension: Secondary | ICD-10-CM | POA: Diagnosis not present

## 2022-04-30 DIAGNOSIS — M810 Age-related osteoporosis without current pathological fracture: Secondary | ICD-10-CM | POA: Diagnosis not present

## 2022-04-30 DIAGNOSIS — I480 Paroxysmal atrial fibrillation: Secondary | ICD-10-CM | POA: Diagnosis not present

## 2022-04-30 DIAGNOSIS — E1169 Type 2 diabetes mellitus with other specified complication: Secondary | ICD-10-CM | POA: Diagnosis not present

## 2022-04-30 DIAGNOSIS — E785 Hyperlipidemia, unspecified: Secondary | ICD-10-CM | POA: Diagnosis not present

## 2022-04-30 DIAGNOSIS — R911 Solitary pulmonary nodule: Secondary | ICD-10-CM | POA: Diagnosis not present

## 2022-04-30 DIAGNOSIS — I77810 Thoracic aortic ectasia: Secondary | ICD-10-CM | POA: Diagnosis not present

## 2022-04-30 DIAGNOSIS — R9389 Abnormal findings on diagnostic imaging of other specified body structures: Secondary | ICD-10-CM | POA: Diagnosis not present

## 2022-05-02 ENCOUNTER — Ambulatory Visit: Payer: PPO | Admitting: Physician Assistant

## 2022-05-02 DIAGNOSIS — Z6826 Body mass index (BMI) 26.0-26.9, adult: Secondary | ICD-10-CM | POA: Diagnosis not present

## 2022-05-02 DIAGNOSIS — Z01419 Encounter for gynecological examination (general) (routine) without abnormal findings: Secondary | ICD-10-CM | POA: Diagnosis not present

## 2022-05-09 NOTE — Progress Notes (Unsigned)
Cardiology Office Note:    Date:  05/10/2022   ID:  Amanda Frost, DOB 10-Jan-1951, MRN [id number]  PCP:  Amanda Infante, MD   [facility] Providers Cardiologist:  Amanda Klein, MD Electrophysiologist:  Amanda Meredith Leeds, MD     Referring MD: Amanda Infante, MD   Chief Complaint: palpitations  History of Present Illness:    Amanda Frost is a very pleasant 72 y.o. female with a hx of PAF and paroxysmal atrial tachycardia, s/p A-fib ablation 07/13/2021 HTN, liver cirrhosis secondary to nonalcoholic steatohepatitis, abdominal aorta atherosclerosis, and mild ascending aortic dilatation.   She underwent cardiac catheterization 11/2007 with normal LV size function, patent coronary arteries. Diagnosed with atrial fibrillation in 2016. History of multiple cardioversions. Also treated with flecainide in the past. She underwent A-fib ablation on 07/13/2021 with Dr. Curt Frost.  Following ablation, she had episodes of atrial fibrillation requiring cardioversion in ER.  CT of the abdomen and pelvis performed 03/03/2021 with some atherosclerotic plaque in the abdominal aorta.  Recommendation to repeat this in 2 to 3 years. Seen in cardiology clinic by Dr. Sallyanne Frost 03/17/21 at which time she had no specific cardiac concerns and was still pondering whether to proceed with a fib ablation which was completed 07/2021.  Last cardiology clinic visit was 01/16/2022 with Dr. Curt Frost at which time she reported she continued to have short episodes of palpitations lasting a few seconds at a time.  They occur once every few weeks. She does not take any as needed medications for these episodes.   Today, she is here with her husband for evaluation of palpitations. Has a history of palpitations that she associates with A-fib that feel like a thump and make her feel lightheaded - has not had any episodes like this. Recent episodes of palpitations have been different, feel more like a skip. They occur for one  second then may reoccur several minutes or hours later or not at all. Had 3 episodes today while waiting to be seen. She denies any associated diaphoresis, n/v, or shortness of breath.  She does not have any chest pain, presyncope, syncope, orthopnea or PND.  She remains active at home and working in an afterschool program at a local school.  Past Medical History:  Diagnosis Date   Hypertension    Liver cirrhosis secondary to NASH (nonalcoholic steatohepatitis) ([place])    Ocular migraine    Palpitations    PAC's,PVC's,atrial tachycardia   Paroxysmal atrial fibrillation (HCC)     Past Surgical History:  Procedure Laterality Date   ATRIAL FIBRILLATION ABLATION N/A 07/12/2021   Procedure: ATRIAL FIBRILLATION ABLATION;  Surgeon: Constance Haw, MD;  Location: [place] [facility];  Service: Cardiovascular;  Laterality: N/A;   BREAST BIOPSY  1994   CARDIAC CATHETERIZATION  11/05/2007   normal LV fx,patent coronary arteries   NM MYOVIEW LTD  03/12/2005   normal   US ECHOCARDIOGRAPHY  11/26/2007   mild LVH,trace MR,TR    Current Medications: Current Meds  Medication Sig   adapalene (DIFFERIN) 0.1 % cream Apply 1 application. topically daily as needed (acne).   Cholecalciferol (VITAMIN D3) 5000 units CAPS Take 10,000 Units by mouth every Monday, Tuesday, Wednesday, Thursday, and Friday.   diltiazem (CARDIZEM) 30 MG tablet Take 1 tablet (30 mg total) by mouth 4 (four) times daily as needed (PALPITATIONS HEARTRATE HIGHER THAN 120 BPM).   metoprolol succinate (TOPROL-XL) 50 MG 24 hr tablet TAKING 1.5 TABLET BY MOUTH TWICE DAILY   metoprolol tartrate (LOPRESSOR)  25 MG tablet Take 1 tablet (25 mg total) by mouth as needed (For Palpitations).   olmesartan (BENICAR) 20 MG tablet Take 20 mg by mouth daily. Per patient taking 0.5 (1/4) tab of 20 mg   [DISCONTINUED] apixaban (ELIQUIS) 5 MG TABS tablet TAKE 1 TABLET BY MOUTH TWICE A DAY     Allergies:   Ciprofloxacin, Macrodantin [nitrofurantoin  macrocrystal], Sulfa antibiotics, Atorvastatin, and Epinephrine   Social History   Socioeconomic History   Marital status: Married    Spouse name: Not on file   Number of children: 4   Years of education: Not on file   Highest education level: Some college, no degree  Occupational History   Not on file  Tobacco Use   Smoking status: Never   Smokeless tobacco: Never   Tobacco comments:    Never smoke 09/04/21  Vaping Use   Vaping Use: Never used  Substance and Sexual Activity   Alcohol use: No    Alcohol/week: 0.0 standard drinks of alcohol   Drug use: No   Sexual activity: Yes  Other Topics Concern   Not on file  Social History Narrative   Pt lives in [place] with spouse and 10 yr old granddaughter.  Retired Control and instrumentation engineer (kindergarten)   Right handed   Caffeine: tea, 1 cup/day   Social Determinants of Radio broadcast assistant Strain: Not on file  Food Insecurity: Not on file  Transportation Needs: Not on file  Physical Activity: Not on file  Stress: Not on file  Social Connections: Not on file     Family History: The patient's family history includes Diabetes in her father and mother; Hypertension in her father and mother; Kidney disease in her mother; Migraines in her maternal grandmother, son, and son.  ROS:   Please see the history of present illness.    + palpitations All other systems reviewed and are negative.  Labs/Other Studies Reviewed:    The following studies were reviewed today:   CONCLUSIONS: 1. Sinus rhythm upon presentation.   2. Successful electrical isolation and anatomical encircling of all four pulmonary veins with radiofrequency current. 3. No inducible arrhythmias following ablation both on and off of dobutamine 4. No early apparent complications.  Coronary CTA 05/29/19 IMPRESSION: 1. Calcium score 0   2.  CAD RADS 0 normal left dominant coronary arteries   3. Dilated aortic root 4.0 cm in orthogonal measurements Suggest  f/u CTA in a year Aortic valve appears tri leaflet  Recent Labs: 08/02/2021: TSH 2.223 08/30/2021: Magnesium 1.8 09/10/2021: ALT 64; BUN 14; Creatinine, Ser 0.97; Hemoglobin 13.3; Platelets 311; Potassium 3.8; Sodium 138  Recent Lipid Panel    Component Value Date/Time   CHOL  11/05/2007 0821    136        ATP III CLASSIFICATION:  <200     mg/dL   Desirable  200-239  mg/dL   Borderline High  >=240    mg/dL   High   TRIG 145 11/05/2007 0821   HDL 36 (L) 11/05/2007 0821   CHOLHDL 3.8 11/05/2007 0821   VLDL 29 11/05/2007 0821   LDLCALC  11/05/2007 0821    71        Total Cholesterol/HDL:CHD Risk Coronary Heart Disease Risk Table                     Men   Women  1/2 Average Risk   3.4   3.3     Risk Assessment/Calculations:  CHA2DS2-VASc Score = 4   This indicates a 4.8% annual risk of stroke. The patient's score is based upon: CHF History: 0 HTN History: 1 Diabetes History: 0 Stroke History: 0 Vascular Disease History: 1 Age Score: 1 Gender Score: 1    Physical Exam:    VS:  BP (!) 140/86   Pulse 76   Ht '5\' 8"'$  (1.727 m)   Wt 172 lb (78 kg)   SpO2 95%   BMI 26.15 kg/m     Wt Readings from Last 3 Encounters:  05/10/22 172 lb (78 kg)  01/16/22 172 lb (78 kg)  10/11/21 172 lb 6.4 oz (78.2 kg)     GEN:  Well nourished, well developed in no acute distress HEENT: Normal NECK: No JVD; No carotid bruits CARDIAC: RRR, no murmurs, rubs, gallops RESPIRATORY:  Clear to auscultation without rales, wheezing or rhonchi  ABDOMEN: Soft, non-tender, non-distended MUSCULOSKELETAL:  No edema; No deformity. 2+ pedal pulses, equal bilaterally SKIN: Warm and dry NEUROLOGIC:  Alert and oriented x 3 PSYCHIATRIC:  Normal affect   EKG:  EKG is ordered today.  The ekg ordered today demonstrates normal sinus rhythm at 70 bpm, no ST abnormality   Diagnoses:    1. Palpitations   2. Paroxysmal atrial fibrillation (HCC)   3. Essential hypertension   4. Long term current use of  anticoagulant   5. Ascending aorta dilation (HCC)   6. Hyperlipidemia LDL goal <100   7. Aortic atherosclerosis (HCC)    Assessment and Plan:     Palpitations: Has episodes of one second palpitations that feel like a skip. They occur randomly, possibly more so when she is nervous. No evidence of ectopy on EKG today. From her description, I suspect PACs/PVCs which were seen on cardiac monitor 10/2019.  Encouraged her to consider getting Kardia monitor. Episodes so short, not sure she could capture them but she Amanda consider this. She is reassured by the normal EKG. She has as needed diltiazem and metoprolol and takes Toprol 50 mg daily. No changes in therapy.   PAF on chronic anticoagulation: S/p ablation 07/2021. No episodes of a fib to her awareness. Palpitations as described above. NSR on EKG today. Continue routine follow-up with Dr. Curt Frost.  Hypertension: BP initially elevated, admits that she is anxious. Unfortunately, did not recheck prior to her departure. Reports BP has been well-controlled.  She had recently reduced olmesartan due to episodes of hypotension. Management per PCP.   Ascending aortic dilatation: Minor dilatation (maximum diameter 3.9 cm) on CT 03/2021. Recommendation from Dr. Sallyanne Frost to recheck in 2-3 years.   Aortic atherosclerosis/Hyperlipidemia LDL goal < 100: Recent lab work with Dr. Joylene Draft. I asked her to review results for cholesterol testing. Recommendation to consider statin if LDL > 100. Management per PCP.   Disposition: 1 year with Dr. Kellie Shropshire months with Dr. Curt Frost  Medication Adjustments/Labs and Tests Ordered: Current medicines are reviewed at length with the patient today.  Concerns regarding medicines are outlined above.  Orders Placed This Encounter  Procedures   EKG 12-Lead   Meds ordered this encounter  Medications   apixaban (ELIQUIS) 5 MG TABS tablet    Sig: Take 1 tablet (5 mg total) by mouth 2 (two) times daily.    Dispense:  60 tablet     Refill:  11    Patient Instructions  Medication Instructions:   Your physician recommends that you continue on your current medications as directed. Please refer to the Current Medication list given to  you today.   *If you need a refill on your cardiac medications before your next appointment, please call your pharmacy*   Lab Work:  None ordered.  If you have labs (blood work) drawn today and your tests are completely normal, you Amanda receive your results only by: [place] (if you have MyChart) OR A paper copy in the mail If you have any lab test that is abnormal or we need to change your treatment, we Amanda call you to review the results.   Testing/Procedures:  None ordered.   Follow-Up: At [facility], you and your health needs are our priority.  As part of our continuing mission to provide you with exceptional heart care, we have created designated Provider Care Teams.  These Care Teams include your primary Cardiologist (physician) and Advanced Practice Providers (APPs -  Physician Assistants and Nurse Practitioners) who all work together to provide you with the care you need, when you need it.  We recommend signing up for the patient portal called "MyChart".  Sign up information is provided on this After Visit Summary.  MyChart is used to connect with patients for Virtual Visits (Telemedicine).  Patients are able to view lab/test results, encounter notes, upcoming appointments, etc.  Non-urgent messages can be sent to your provider as well.   To learn more about what you can do with MyChart, go to [email or web address].    Your next appointment:   1 year(s)  Provider:   Sanda Klein, MD     Other Instructions  Your physician wants you to follow-up in: 1 year with Dr. Sallyanne Frost. You Amanda receive a reminder letter in the mail two months in advance. If you don't receive a letter, please call our office to schedule the follow-up appointment.      Signed, Emmaline Life, NP  05/10/2022 2:12 PM    [place]

## 2022-05-10 ENCOUNTER — Encounter: Payer: Self-pay | Admitting: Nurse Practitioner

## 2022-05-10 ENCOUNTER — Ambulatory Visit: Payer: PPO | Attending: Physician Assistant | Admitting: Nurse Practitioner

## 2022-05-10 ENCOUNTER — Encounter (HOSPITAL_COMMUNITY): Payer: Self-pay | Admitting: *Deleted

## 2022-05-10 VITALS — BP 140/86 | HR 76 | Ht 68.0 in | Wt 172.0 lb

## 2022-05-10 DIAGNOSIS — E785 Hyperlipidemia, unspecified: Secondary | ICD-10-CM | POA: Diagnosis not present

## 2022-05-10 DIAGNOSIS — I700 Atherosclerosis of aorta: Secondary | ICD-10-CM | POA: Diagnosis not present

## 2022-05-10 DIAGNOSIS — Z7901 Long term (current) use of anticoagulants: Secondary | ICD-10-CM

## 2022-05-10 DIAGNOSIS — I480 Paroxysmal atrial fibrillation: Secondary | ICD-10-CM | POA: Diagnosis not present

## 2022-05-10 DIAGNOSIS — R002 Palpitations: Secondary | ICD-10-CM

## 2022-05-10 DIAGNOSIS — I77810 Thoracic aortic ectasia: Secondary | ICD-10-CM | POA: Diagnosis not present

## 2022-05-10 DIAGNOSIS — I10 Essential (primary) hypertension: Secondary | ICD-10-CM | POA: Diagnosis not present

## 2022-05-10 MED ORDER — APIXABAN 5 MG PO TABS: 5.0000 mg | ORAL_TABLET | Freq: Two times a day (BID) | ORAL | 11 refills | Status: DC

## 2022-05-10 NOTE — Patient Instructions (Signed)
Medication Instructions:   Your physician recommends that you continue on your current medications as directed. Please refer to the Current Medication list given to you today.   *If you need a refill on your cardiac medications before your next appointment, please call your pharmacy*   Lab Work:  None ordered.  If you have labs (blood work) drawn today and your tests are completely normal, you will receive your results only by: [place] (if you have MyChart) OR A paper copy in the mail If you have any lab test that is abnormal or we need to change your treatment, we will call you to review the results.   Testing/Procedures:  None ordered.   Follow-Up: At [facility], you and your health needs are our priority.  As part of our continuing mission to provide you with exceptional heart care, we have created designated Provider Care Teams.  These Care Teams include your primary Cardiologist (physician) and Advanced Practice Providers (APPs -  Physician Assistants and Nurse Practitioners) who all work together to provide you with the care you need, when you need it.  We recommend signing up for the patient portal called "MyChart".  Sign up information is provided on this After Visit Summary.  MyChart is used to connect with patients for Virtual Visits (Telemedicine).  Patients are able to view lab/test results, encounter notes, upcoming appointments, etc.  Non-urgent messages can be sent to your provider as well.   To learn more about what you can do with MyChart, go to [email or web address].    Your next appointment:   1 year(s)  Provider:   Sanda Klein, MD     Other Instructions  Your physician wants you to follow-up in: 1 year with Dr. Sallyanne Kuster. You will receive a reminder letter in the mail two months in advance. If you don't receive a letter, please call our office to schedule the follow-up appointment.

## 2022-05-28 DIAGNOSIS — H02886 Meibomian gland dysfunction of left eye, unspecified eyelid: Secondary | ICD-10-CM | POA: Diagnosis not present

## 2022-05-28 DIAGNOSIS — H02883 Meibomian gland dysfunction of right eye, unspecified eyelid: Secondary | ICD-10-CM | POA: Diagnosis not present

## 2022-05-28 DIAGNOSIS — H04123 Dry eye syndrome of bilateral lacrimal glands: Secondary | ICD-10-CM | POA: Diagnosis not present

## 2022-06-06 ENCOUNTER — Institutional Professional Consult (permissible substitution): Payer: PPO | Admitting: Pulmonary Disease

## 2022-06-07 DIAGNOSIS — M7662 Achilles tendinitis, left leg: Secondary | ICD-10-CM | POA: Diagnosis not present

## 2022-06-07 DIAGNOSIS — M216X2 Other acquired deformities of left foot: Secondary | ICD-10-CM | POA: Diagnosis not present

## 2022-06-07 DIAGNOSIS — M79672 Pain in left foot: Secondary | ICD-10-CM | POA: Diagnosis not present

## 2022-06-07 DIAGNOSIS — M7672 Peroneal tendinitis, left leg: Secondary | ICD-10-CM | POA: Diagnosis not present

## 2022-06-07 DIAGNOSIS — M216X9 Other acquired deformities of unspecified foot: Secondary | ICD-10-CM | POA: Diagnosis not present

## 2022-06-07 DIAGNOSIS — S93492A Sprain of other ligament of left ankle, initial encounter: Secondary | ICD-10-CM | POA: Diagnosis not present

## 2022-06-08 ENCOUNTER — Encounter: Payer: Self-pay | Admitting: Pulmonary Disease

## 2022-06-08 ENCOUNTER — Ambulatory Visit: Payer: PPO | Admitting: Pulmonary Disease

## 2022-06-08 VITALS — BP 142/78 | HR 77 | Temp 98.4°F | Ht 68.0 in | Wt 171.4 lb

## 2022-06-08 DIAGNOSIS — J849 Interstitial pulmonary disease, unspecified: Secondary | ICD-10-CM | POA: Diagnosis not present

## 2022-06-08 NOTE — Patient Instructions (Signed)
Will check some labs for further evaluation Your CT scan shows very mild changes which are nonspecific but would like to monitor this Will order high-res CT in 6 months and follow-up in clinic after CT scan Follow-up in clinic in 6 months after CT scan

## 2022-06-08 NOTE — Progress Notes (Signed)
Amanda Frost    CU:[id number]    11-Jul-1950  Primary Care Physician:Perini, Elta Guadeloupe, MD  Referring Physician: Crist Infante, [address],  [place] [address]  Chief complaint: Consult for abnormal CT, lung nodules  HPI: 72 y.o. who  has a past medical history of Hypertension, Liver cirrhosis secondary to NASH (nonalcoholic steatohepatitis) ([place]), Ocular migraine, Palpitations, and Paroxysmal atrial fibrillation ([place]).   Referred here for abnormal CT, pulmonary nodule.  Had a lesion CT performed on 01/06/2022 for follow-up of pulmonary nodule showed mild groundglass opacities in the mid to lower lungs and has been referred here for further evaluation Overall she feels well with no dyspnea, cough, chest congestion  Pets: Dogs, cats Occupation: Biochemist, clinical.  Still works part-time Exposures: No mold, hot tub, Customer service manager.  She has a feather pillow in her living room couch Smoking history: Never smoker Travel history: No significant travel history Relevant family history: No family history of lung disease  Outpatient Encounter Medications as of 06/08/2022  Medication Sig   apixaban (ELIQUIS) 5 MG TABS tablet Take 1 tablet (5 mg total) by mouth 2 (two) times daily.   Cholecalciferol (VITAMIN D3) 5000 units CAPS Take 10,000 Units by mouth every Monday, Tuesday, Wednesday, Thursday, and Friday.   metFORMIN (GLUCOPHAGE-XR) 500 MG 24 hr tablet Take 500 mg by mouth daily with breakfast.   metoprolol succinate (TOPROL-XL) 50 MG 24 hr tablet TAKING 1.5 TABLET BY MOUTH TWICE DAILY   adapalene (DIFFERIN) 0.1 % cream Apply 1 application. topically daily as needed (acne). (Patient not taking: Reported on 06/08/2022)   diltiazem (CARDIZEM) 30 MG tablet Take 1 tablet (30 mg total) by mouth 4 (four) times daily as needed (PALPITATIONS HEARTRATE HIGHER THAN 120 BPM). (Patient not taking: Reported on 06/08/2022)   metoprolol tartrate (LOPRESSOR) 25 MG tablet Take 1 tablet (25 mg total)  by mouth as needed (For Palpitations).   olmesartan (BENICAR) 20 MG tablet Take 20 mg by mouth daily. Per patient taking 0.5 (1/4) tab of 20 mg (Patient not taking: Reported on 06/08/2022)   No facility-administered encounter medications on file as of 06/08/2022.    Allergies as of 06/08/2022 - Review Complete 06/08/2022  Allergen Reaction Noted   Ciprofloxacin Palpitations and Other (See Comments) 04/03/2016   Macrodantin [nitrofurantoin macrocrystal] Hives 02/12/2015   Sulfa antibiotics Hives 03/25/2012   Atorvastatin  05/04/2021   Epinephrine  08/23/2020    Past Medical History:  Diagnosis Date   Hypertension    Liver cirrhosis secondary to NASH (nonalcoholic steatohepatitis) ([facility])    Ocular migraine    Palpitations    PAC's,PVC's,atrial tachycardia   Paroxysmal atrial fibrillation ([place])     Past Surgical History:  Procedure Laterality Date   ATRIAL FIBRILLATION ABLATION N/A 07/12/2021   Procedure: ATRIAL FIBRILLATION ABLATION;  Surgeon: Constance Haw, MD;  Location: [facility];  Service: Cardiovascular;  Laterality: N/A;   BREAST BIOPSY  1994   CARDIAC CATHETERIZATION  11/05/2007   normal LV fx,patent coronary arteries   NM MYOVIEW LTD  03/12/2005   normal   US ECHOCARDIOGRAPHY  11/26/2007   mild LVH,trace MR,TR    Family History  Problem Relation Age of Onset   Diabetes Mother    Hypertension Mother    Kidney disease Mother    Diabetes Father    Hypertension Father    Migraines Maternal Grandmother    Migraines Son        childhood   Migraines  Son        childhood    Social History   Socioeconomic History   Marital status: Married    Spouse name: Not on file   Number of children: 4   Years of education: Not on file   Highest education level: Some college, no degree  Occupational History   Not on file  Tobacco Use   Smoking status: Never    Passive exposure: Past   Smokeless tobacco: Never  Vaping Use   Vaping Use: Never used  Substance  and Sexual Activity   Alcohol use: No    Alcohol/week: 0.0 standard drinks of alcohol   Drug use: No   Sexual activity: Yes  Other Topics Concern   Not on file  Social History Narrative   Pt lives in [place] with spouse and 69 yr old granddaughter.  Retired Control and instrumentation engineer (kindergarten)   Right handed   Caffeine: tea, 1 cup/day   Social Determinants of Radio broadcast assistant Strain: Not on file  Food Insecurity: Not on file  Transportation Needs: Not on file  Physical Activity: Not on file  Stress: Not on file  Social Connections: Not on file  Intimate Partner Violence: Not on file    Review of systems: Review of Systems  Constitutional: Negative for fever and chills.  HENT: Negative.   Eyes: Negative for blurred vision.  Respiratory: as per HPI  Cardiovascular: Negative for chest pain and palpitations.  Gastrointestinal: Negative for vomiting, diarrhea, blood per rectum. Genitourinary: Negative for dysuria, urgency, frequency and hematuria.  Musculoskeletal: Negative for myalgias, back pain and joint pain.  Skin: Negative for itching and rash.  Neurological: Negative for dizziness, tremors, focal weakness, seizures and loss of consciousness.  Endo/Heme/Allergies: Negative for environmental allergies.  Psychiatric/Behavioral: Negative for depression, suicidal ideas and hallucinations.  All other systems reviewed and are negative.  Physical Exam: Blood pressure (!) 142/78, pulse 77, temperature 98.4 F (36.9 C), temperature source Oral, height '5\' 8"'$  (1.727 m), weight 171 lb 6.4 oz (77.7 kg), SpO2 96 %. Gen:      No acute distress HEENT:  EOMI, sclera anicteric Neck:     No masses; no thyromegaly Lungs:    Clear to auscultation bilaterally; normal respiratory effort CV:         Regular rate and rhythm; no murmurs Abd:      + bowel sounds; soft, non-tender; no palpable masses, no distension Ext:    No edema; adequate peripheral perfusion Skin:      Warm and  dry; no rash Neuro: alert and oriented x 3 Psych: normal mood and affect  Data Reviewed: Imaging: CT chest 12/27/2021-stable pulmonary nodules compared to 2022 High-res CT 01/06/2022-mild groundglass opacities in the mid to lower lungs, stable pulmonary nodules I have reviewed the images personally.  PFTs:  Labs:  Assessment:  Consult for abnormal CT CT shows mild changes of groundglass which are nonspecific.  There is no clear evidence of interstitial lung disease.  We can monitor this and order high-res CT in 6 months  Pulmonary nodules Stable on follow-up.  Likely benign  Plan/Recommendations: Follow-up high-res CT in 6 months  Marshell Garfinkel MD [facility] 06/08/2022, 8:44 AM  CC: Crist Infante, MD

## 2022-06-11 ENCOUNTER — Telehealth: Payer: Self-pay | Admitting: Pulmonary Disease

## 2022-06-11 NOTE — Telephone Encounter (Signed)
Patient would like to discuss the labwork results. Patient phone number is [phone].

## 2022-06-11 NOTE — Telephone Encounter (Signed)
All of the results are not back yet from the lab.  The labs that have come back so far show borderline elevation of factor called ANA.  This is likely nonspecific and nothing to be concerned about.

## 2022-06-11 NOTE — Telephone Encounter (Signed)
Called and spoke with pt to inform pt her lab results have not been read yet, but I would reach out to the doctor for further information.  Dr. Vaughan Browner please advise ?

## 2022-06-12 LAB — ANA: Anti Nuclear Antibody (ANA): POSITIVE — AB

## 2022-06-12 LAB — ANTI-NUCLEAR AB-TITER (ANA TITER): ANA Titer 1: 1:40 {titer} — ABNORMAL HIGH

## 2022-06-12 LAB — ANTI-DNA ANTIBODY, DOUBLE-STRANDED: ds DNA Ab: 1 IU/mL

## 2022-06-12 LAB — CYCLIC CITRUL PEPTIDE ANTIBODY, IGG: Cyclic Citrullin Peptide Ab: <16 UNITS

## 2022-06-12 LAB — RHEUMATOID FACTOR: Rheumatoid fact SerPl-aCnc: <14 IU/mL (ref <14)

## 2022-06-12 NOTE — Telephone Encounter (Signed)
Spoke with pt and reviewed lab results as dictated by Dr. Vaughan Browner. Pt stated understanding. Nothing further needed at this time.

## 2022-06-14 LAB — HYPERSENSITIVITY PNEUMONITIS
A. Pullulans Abs: NEGATIVE
A.Fumigatus #1 Abs: NEGATIVE
Micropolyspora faeni, IgG: NEGATIVE
Pigeon Serum Abs: NEGATIVE
Thermoact. Saccharii: NEGATIVE
Thermoactinomyces vulgaris, IgG: NEGATIVE

## 2022-06-25 DIAGNOSIS — H8112 Benign paroxysmal vertigo, left ear: Secondary | ICD-10-CM | POA: Diagnosis not present

## 2022-06-25 DIAGNOSIS — H6123 Impacted cerumen, bilateral: Secondary | ICD-10-CM | POA: Diagnosis not present

## 2022-06-25 DIAGNOSIS — R42 Dizziness and giddiness: Secondary | ICD-10-CM | POA: Diagnosis not present

## 2022-06-25 DIAGNOSIS — I10 Essential (primary) hypertension: Secondary | ICD-10-CM | POA: Diagnosis not present

## 2022-06-25 DIAGNOSIS — I480 Paroxysmal atrial fibrillation: Secondary | ICD-10-CM | POA: Diagnosis not present

## 2022-06-27 IMAGING — CT CT HEAD W/O CM
4 series · 17 of 47 positions shown, 19 images · non-contrast
Comparison: 05/21/2018

CLINICAL DATA: Headache, chronic, new features or increased
frequency



[Series 3: head wo · axial · 0.41mm/px · z∈[-139,-24]mm · 7 of 31 slices shown, 9 images]
[im 4/31  brain]
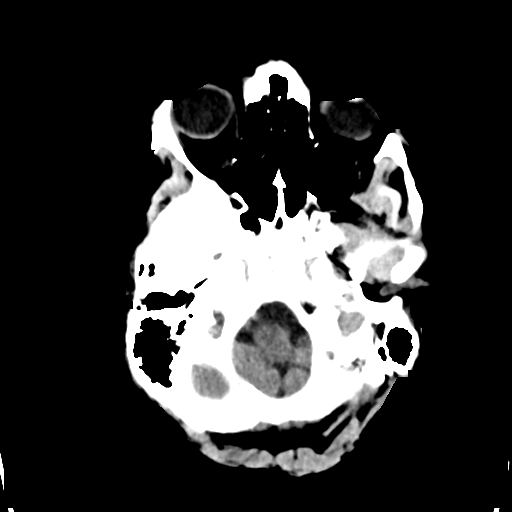
[im 4/31  bone]
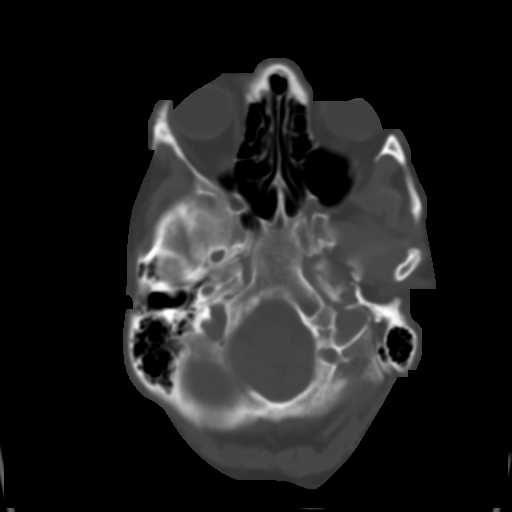
[im 8/31  brain]
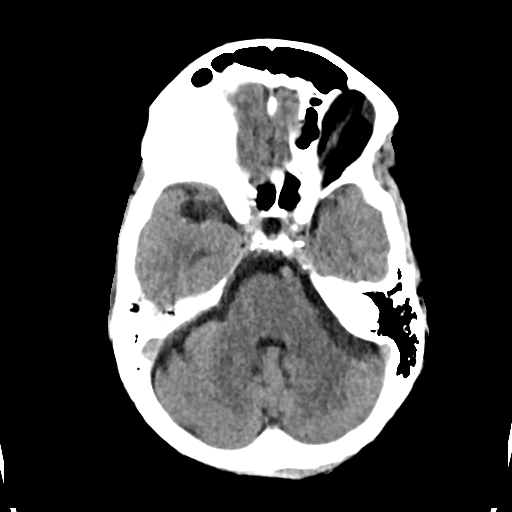
[im 12/31  brain]
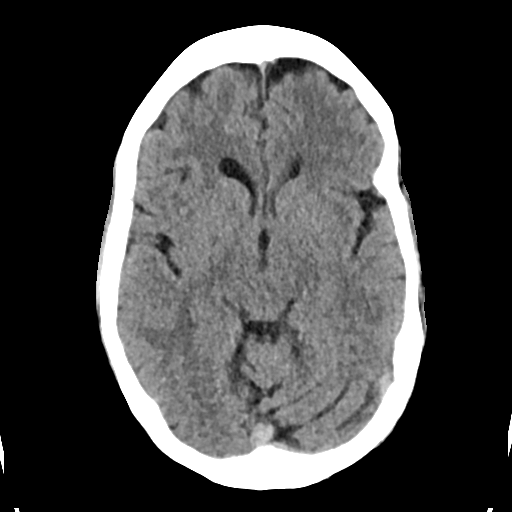
[im 16/31  brain]
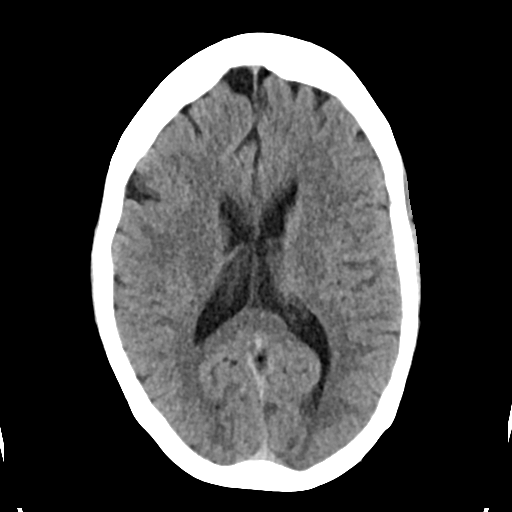
[im 19/31  brain]
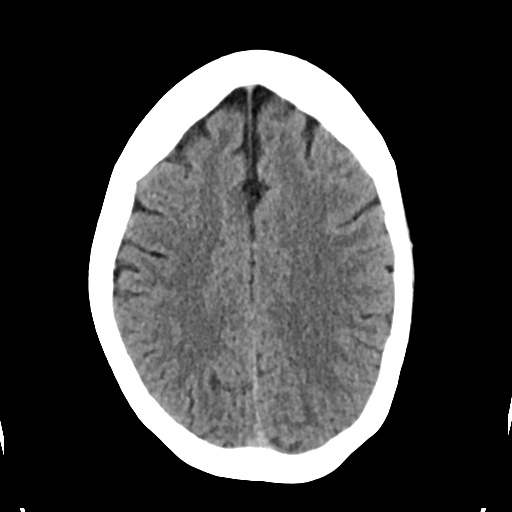
[im 19/31  bone]
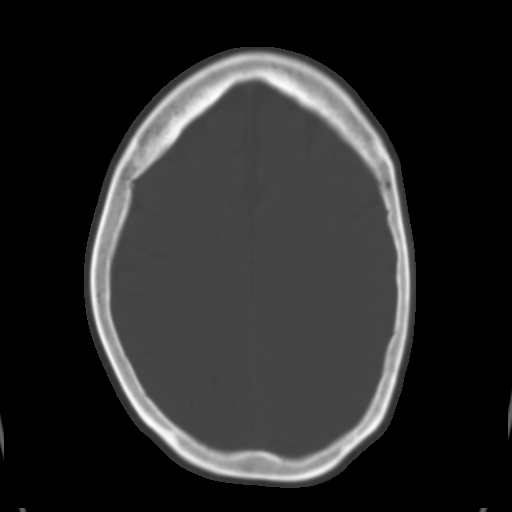
[im 23/31  brain]
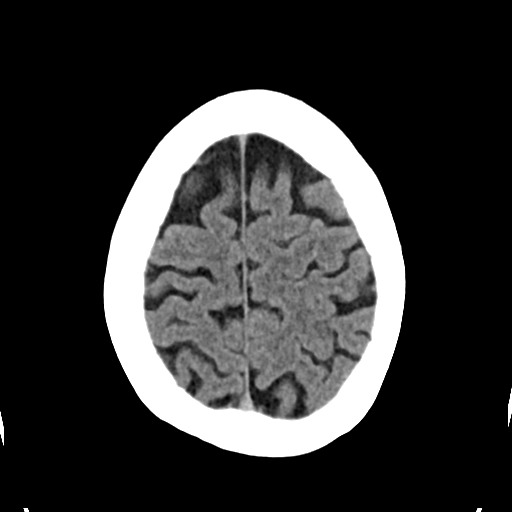
[im 27/31  brain]
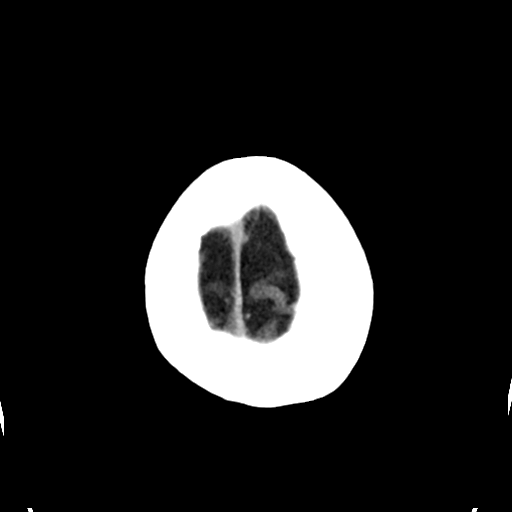

[Series 4: head bone · axial · 0.41mm/px · z∈[-140,-86]mm · 4 of 78 slices shown]
[im 8/78  bone]
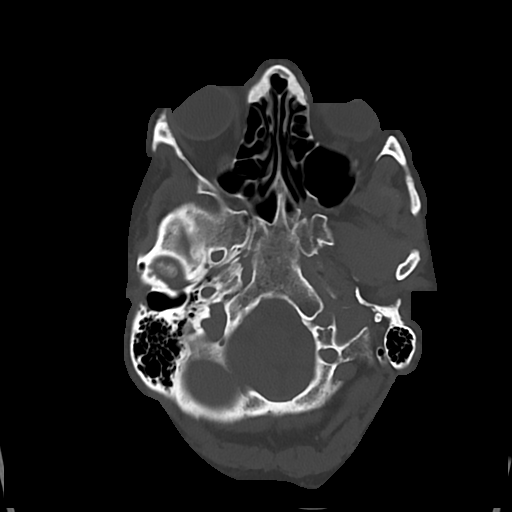
[im 16/78  bone]
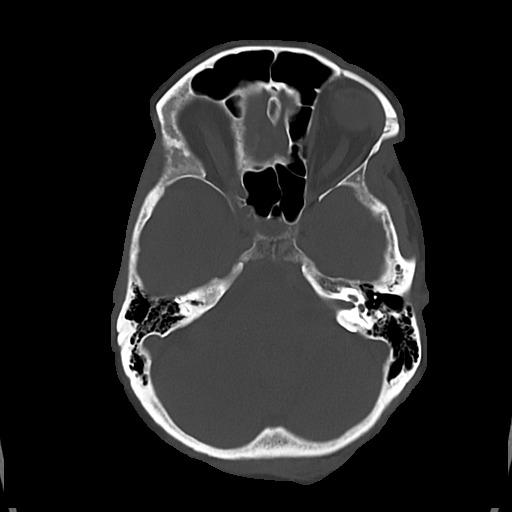
[im 24/78  bone]
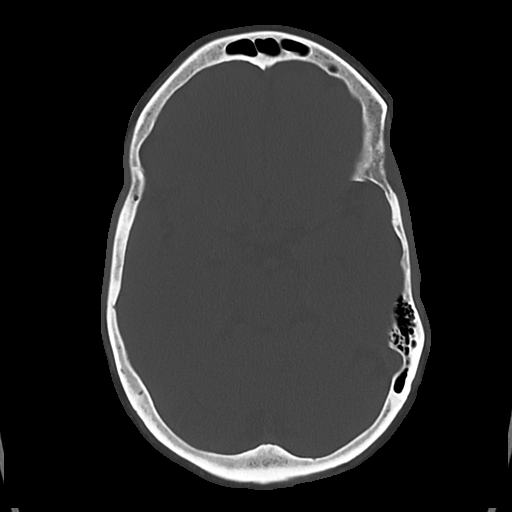
[im 35/78  bone]
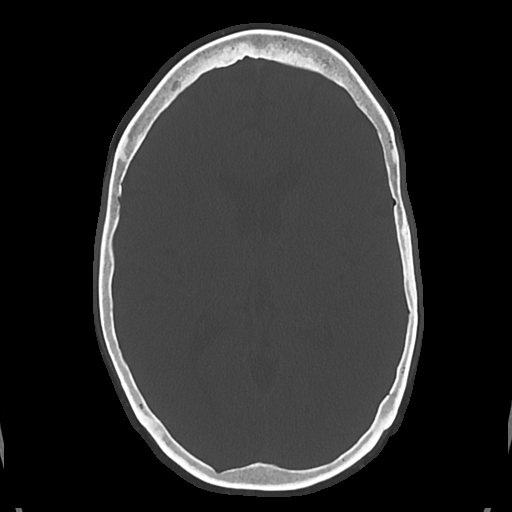

[Series 5: cor soft · coronal · 0.31mm/px · 3 of 68 slices shown]
[im 23/68  brain]
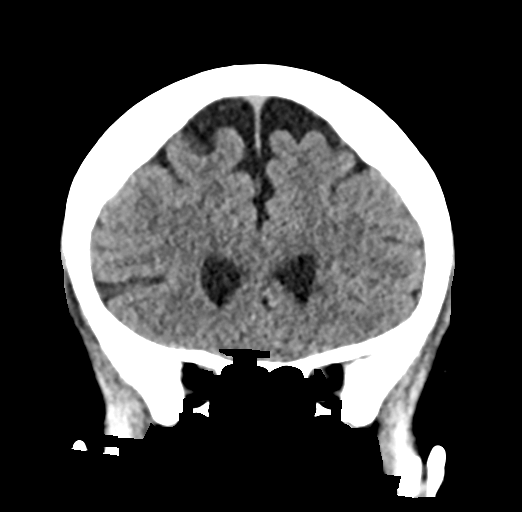
[im 30/68  brain]
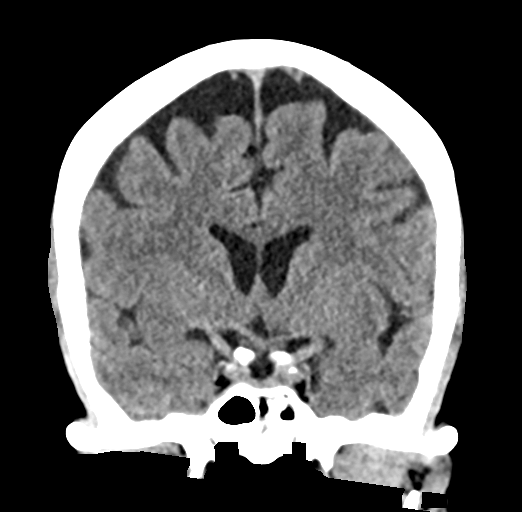
[im 38/68  brain]
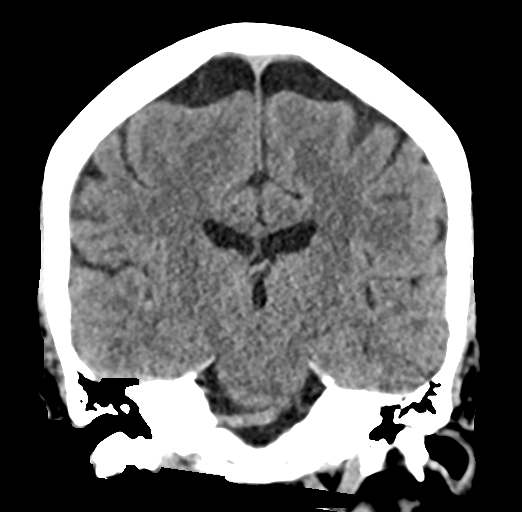

[Series 6: sag soft · sagittal · 0.31mm/px · 3 of 55 slices shown]
[im 20/55  brain]
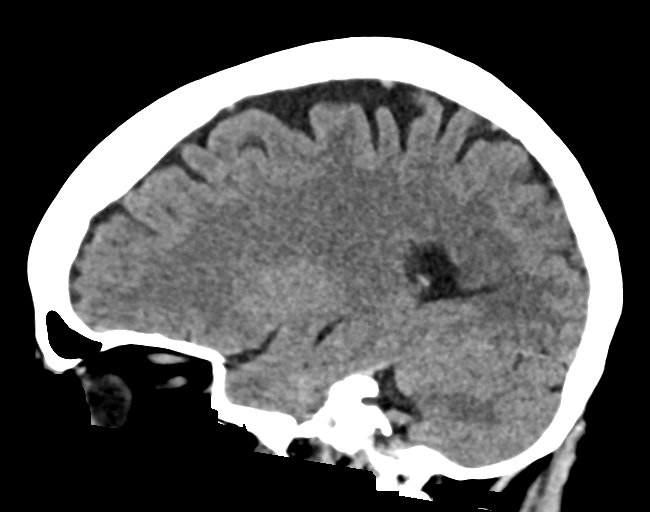
[im 28/55  brain]
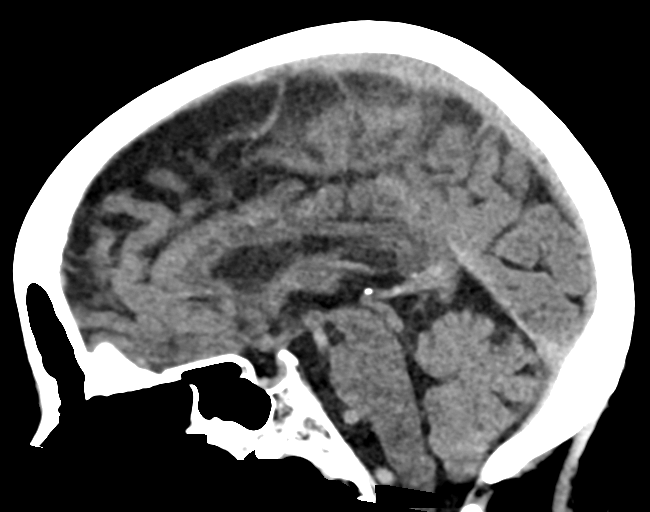
[im 35/55  brain]
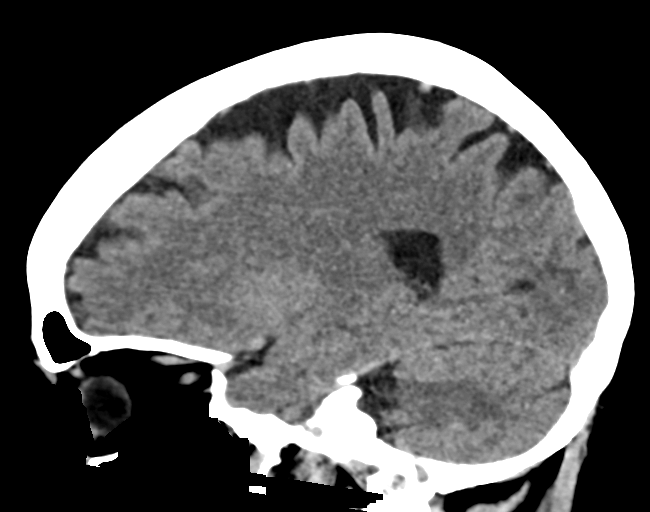

[17 of 47 positions shown; findings below may reference images not displayed]

FINDINGS: Brain: No acute infarct or hemorrhage. Lateral ventricles and
midline structures are unremarkable. No acute extra-axial fluid
collections. No mass effect.

Vascular: No hyperdense vessel or unexpected calcification.

Skull: Normal. Negative for fracture or focal lesion.

Sinuses/Orbits: No acute finding.

Other: None.
IMPRESSION: 1. Stable head CT, no acute intracranial process.

## 2022-06-27 IMAGING — CT CT ANGIO HEAD-NECK (W OR W/O PERF)
2 of 7 series · 8 of 33 positions shown · IV contrast (APPLIED)
Comparison: Prior head CT from earlier the same day.

CLINICAL DATA: Initial evaluation for neuro deficit, stroke
suspected.

EXAM:
CT ANGIOGRAPHY HEAD AND NECK
TECHNIQUE: Multidetector CT imaging of the head and neck was performed using
the standard protocol during bolus administration of intravenous
contrast. Multiplanar CT image reconstructions and MIPs were
obtained to evaluate the vascular anatomy. Carotid stenosis
measurements (when applicable) are obtained utilizing NASCET
criteria, using the distal internal carotid diameter as the
denominator.

[Series 5: cta neck/head · axial · 0.50mm/px · z∈[-202,-94]mm · 2 of 163 slices shown]
[im 55/163  soft-tissue]
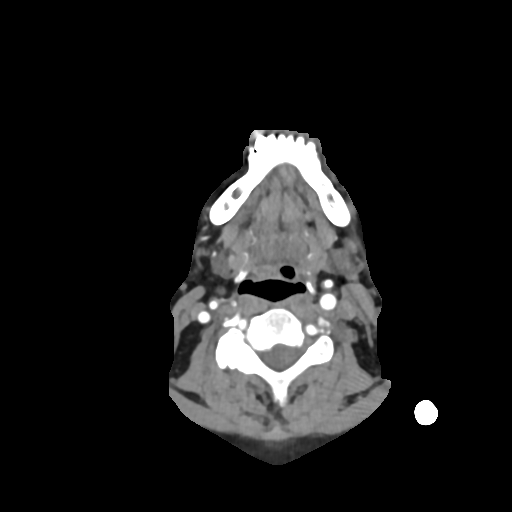
[im 109/163  soft-tissue]
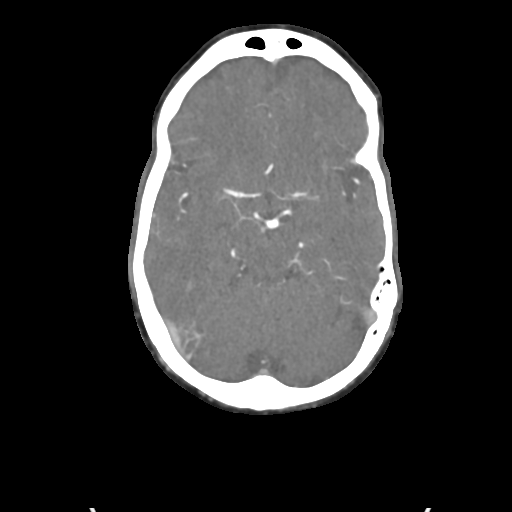

[Series 7: ax thins · axial · 0.43mm/px · z∈[-264,-34]mm · 6 of 325 slices shown]
[im 47/325  soft-tissue]
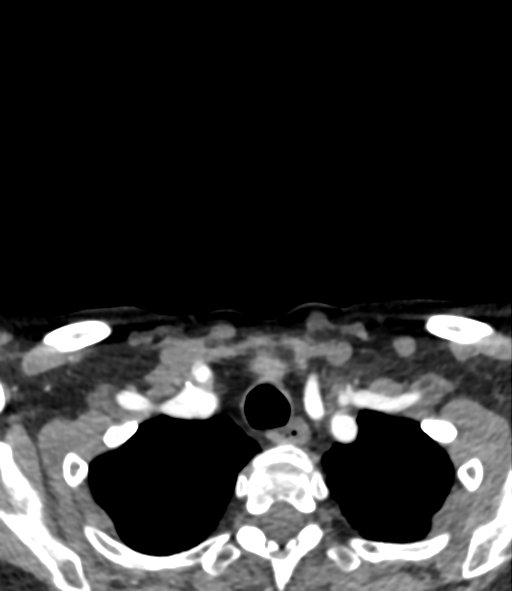
[im 93/325  bone]
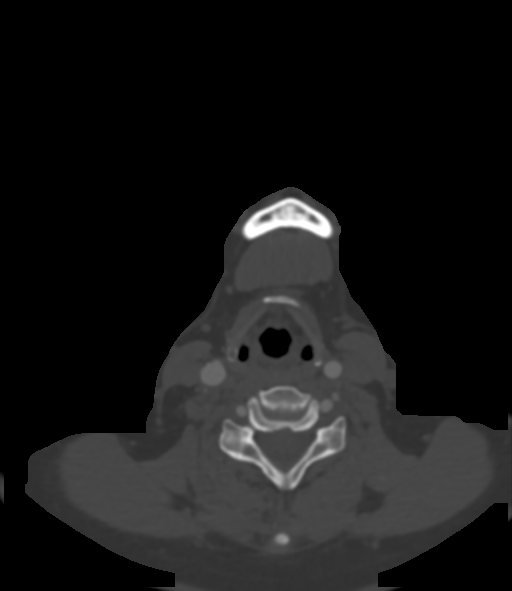
[im 139/325  soft-tissue]
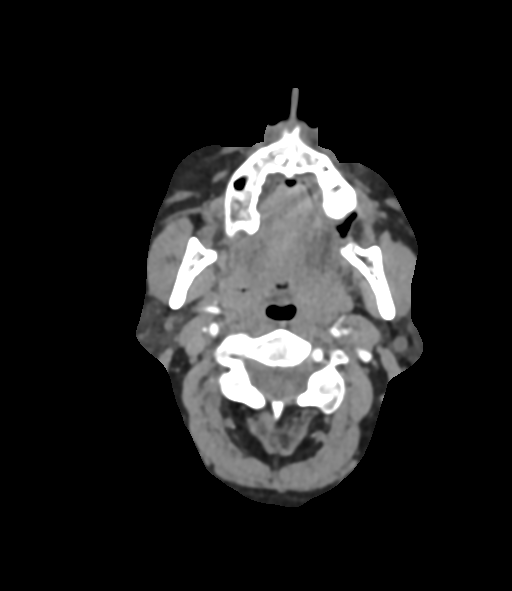
[im 186/325  bone]
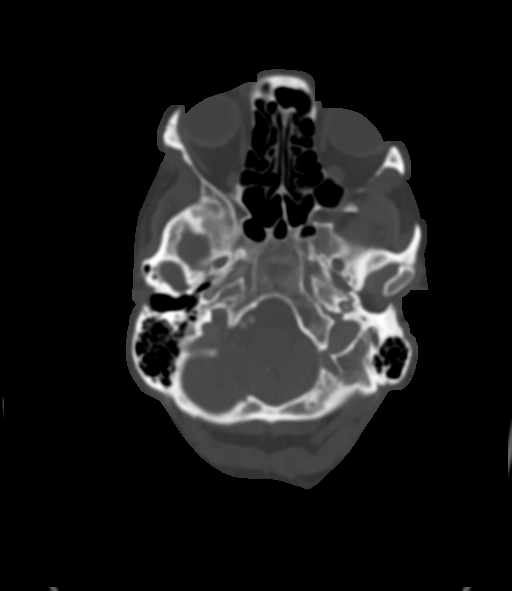
[im 232/325  soft-tissue]
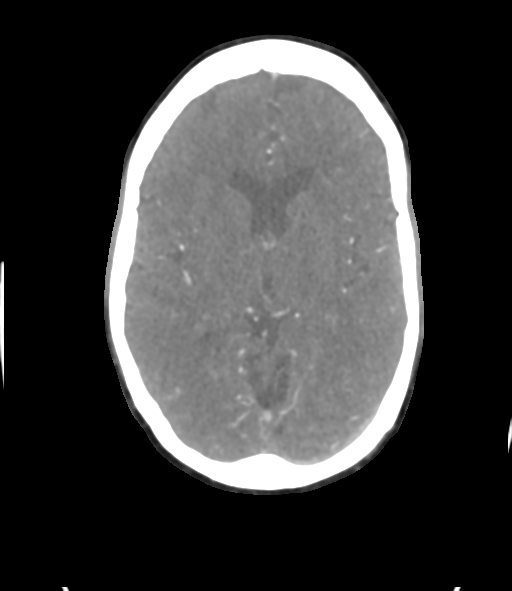
[im 278/325  bone]
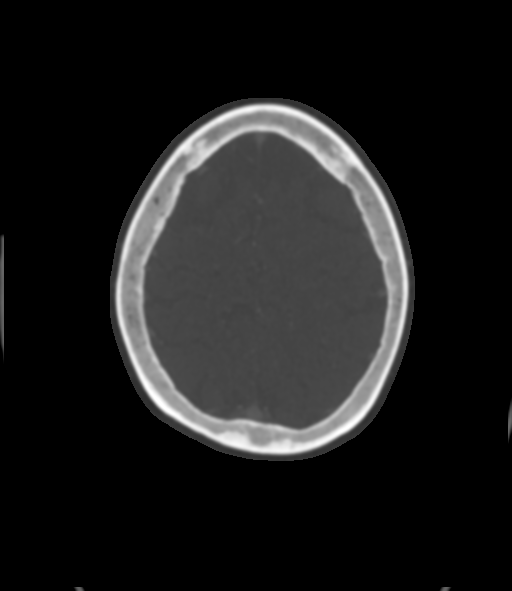

[8 of 33 positions shown; findings below may reference images not displayed]

RADIATION DOSE REDUCTION: This exam was performed according to the
departmental dose-optimization program which includes automated
exposure control, adjustment of the mA and/or kV according to
patient size and/or use of iterative reconstruction technique.

CONTRAST:  75mL OMNIPAQUE IOHEXOL 350 MG/ML SOLN
FINDINGS: CTA NECK FINDINGS

Aortic arch: Visualized aortic arch normal in caliber with normal
branch pattern. No stenosis about the origin of the great vessels.

Right carotid system: Right common and internal carotid arteries
tortuous but widely patent without stenosis or dissection.

Left carotid system: Left common and internal carotid arteries
tortuous but widely patent without stenosis or dissection.

Vertebral arteries: Both vertebral arteries arise from subclavian
arteries. No significant proximal subclavian artery stenosis.
Vertebral arteries tortuous but widely patent without stenosis or
dissection.

Skeleton: No discrete or worrisome osseous lesions. Mild spondylosis
present at C6-7.

Other neck: No other acute soft tissue abnormality within the neck.

Upper chest: 8 mm nodular density partially visualized along the
left major fissure, incompletely visualized on this exam and
indeterminate.

Review of the MIP images confirms the above findings

CTA HEAD FINDINGS

Anterior circulation: Petrous segments patent bilaterally. Minor
atheromatous change within the left carotid siphon without stenosis.
ICAs otherwise widely patent to the termini. A1 segments, anterior
communicating complex common anterior cerebral arteries widely
patent. Normal in stenosis or occlusion. Normal MCA bifurcations.
Distal MCA branches perfused and symmetric.

Posterior circulation: Both V4 segments widely patent to the
vertebrobasilar junction. Right PICA patent. Left PICA not well
seen. Basilar patent to its distal aspect without stenosis. Superior
cerebellar and posterior cerebral arteries widely patent
bilaterally.

Venous sinuses: Patent allowing for timing the contrast bolus.

Anatomic variants: None significant.  No aneurysm.

Review of the MIP images confirms the above findings
IMPRESSION: 1. Negative CTA of the head and neck. No large vessel occlusion or
other emergent finding. No high-grade or correctable stenosis.
2. Diffuse tortuosity of the major arterial vasculature of the head
and neck, suggesting chronic underlying hypertension.
3. 8 mm nodular density along the left major fissure, indeterminate.
While this finding could reflect a small intra pulmonic lymph node,
this is incompletely assessed on this exam. Follow-up examination
with nonemergent chest CT suggested for complete evaluation.

## 2022-07-13 ENCOUNTER — Ambulatory Visit (HOSPITAL_BASED_OUTPATIENT_CLINIC_OR_DEPARTMENT_OTHER): Payer: PPO

## 2022-07-19 DIAGNOSIS — I10 Essential (primary) hypertension: Secondary | ICD-10-CM | POA: Diagnosis not present

## 2022-07-19 DIAGNOSIS — K760 Fatty (change of) liver, not elsewhere classified: Secondary | ICD-10-CM | POA: Diagnosis not present

## 2022-07-19 DIAGNOSIS — E1169 Type 2 diabetes mellitus with other specified complication: Secondary | ICD-10-CM | POA: Diagnosis not present

## 2022-07-19 DIAGNOSIS — E785 Hyperlipidemia, unspecified: Secondary | ICD-10-CM | POA: Diagnosis not present

## 2022-07-20 DIAGNOSIS — E119 Type 2 diabetes mellitus without complications: Secondary | ICD-10-CM | POA: Diagnosis not present

## 2022-07-20 DIAGNOSIS — H25813 Combined forms of age-related cataract, bilateral: Secondary | ICD-10-CM | POA: Diagnosis not present

## 2022-07-20 DIAGNOSIS — H04123 Dry eye syndrome of bilateral lacrimal glands: Secondary | ICD-10-CM | POA: Diagnosis not present

## 2022-07-20 DIAGNOSIS — H40013 Open angle with borderline findings, low risk, bilateral: Secondary | ICD-10-CM | POA: Diagnosis not present

## 2022-07-20 DIAGNOSIS — H524 Presbyopia: Secondary | ICD-10-CM | POA: Diagnosis not present

## 2022-07-23 ENCOUNTER — Other Ambulatory Visit: Payer: Self-pay | Admitting: Cardiovascular Disease

## 2022-07-23 ENCOUNTER — Encounter: Payer: Self-pay | Admitting: Cardiology

## 2022-07-23 ENCOUNTER — Ambulatory Visit: Payer: PPO | Attending: Cardiology | Admitting: Cardiology

## 2022-07-23 VITALS — BP 132/82 | HR 72 | Ht 68.0 in | Wt 173.0 lb

## 2022-07-23 DIAGNOSIS — D6869 Other thrombophilia: Secondary | ICD-10-CM | POA: Diagnosis not present

## 2022-07-23 DIAGNOSIS — I480 Paroxysmal atrial fibrillation: Secondary | ICD-10-CM | POA: Diagnosis not present

## 2022-07-23 DIAGNOSIS — I10 Essential (primary) hypertension: Secondary | ICD-10-CM

## 2022-07-23 NOTE — Progress Notes (Signed)
Electrophysiology Office Note   Date:  07/23/2022   ID:  Amanda Frost, DOB Aug 12, 1950, MRN [id number]  PCP:  Rodrigo Ran, MD  Cardiologist:  [place] Primary Electrophysiologist:  Codylee Patil Jorja Loa, MD    Chief Complaint: palpitations   History of Present Illness: Amanda Frost is a 72 y.o. female who is being seen today for the evaluation of palpitations at the request of Rodrigo Ran, MD. Presenting today for electrophysiology evaluation.  She has a history significant for atrial fibrillation, atrial tachycardia, hypertension, cirrhosis secondary to nonalcoholic steatohepatitis.  She has had multiple cardioversions for atrial fibrillation.  She is post ablation 07/13/2021.  Today, denies symptoms of palpitations, chest pain, shortness of breath, orthopnea, PND, lower extremity edema, claudication, dizziness, presyncope, syncope, bleeding, or neurologic sequela. The patient is tolerating medications without difficulties.  Since being seen she has done well.  She has had no chest pain or shortness of breath.  She continues to do all of her daily activities.  She does have intermittent palpitations that last for a few seconds at a time and make her feel a little lightheaded.  Aside from that, she has been doing well.  She does not think that these are due to atrial fibrillation.   Past Medical History:  Diagnosis Date   Hypertension    Liver cirrhosis secondary to NASH (nonalcoholic steatohepatitis)    Ocular migraine    Palpitations    PAC's,PVC's,atrial tachycardia   Paroxysmal atrial fibrillation    Past Surgical History:  Procedure Laterality Date   ATRIAL FIBRILLATION ABLATION N/A 07/12/2021   Procedure: ATRIAL FIBRILLATION ABLATION;  Surgeon: Regan Lemming, MD;  Location: [facility];  Service: Cardiovascular;  Laterality: N/A;   BREAST BIOPSY  1994   CARDIAC CATHETERIZATION  11/05/2007   normal LV fx,patent coronary arteries   NM MYOVIEW LTD   03/12/2005   normal   US ECHOCARDIOGRAPHY  11/26/2007   mild LVH,trace MR,TR     Current Outpatient Medications  Medication Sig Dispense Refill   apixaban (ELIQUIS) 5 MG TABS tablet Take 1 tablet (5 mg total) by mouth 2 (two) times daily. 60 tablet 11   Cholecalciferol (VITAMIN D3) 5000 units CAPS Take 10,000 Units by mouth every Monday, Tuesday, Wednesday, Thursday, and Friday.     metFORMIN (GLUCOPHAGE-XR) 500 MG 24 hr tablet Take 500 mg by mouth daily with breakfast.     metoprolol succinate (TOPROL-XL) 50 MG 24 hr tablet TAKING 1.5 TABLET BY MOUTH TWICE DAILY 270 tablet 3   olmesartan (BENICAR) 20 MG tablet Take 5 mg by mouth daily. Per patient taking 0.5 (1/4) tab of 20 mg     adapalene (DIFFERIN) 0.1 % cream Apply 1 application. topically daily as needed (acne). (Patient not taking: Reported on 06/08/2022)     diltiazem (CARDIZEM) 30 MG tablet Take 1 tablet (30 mg total) by mouth 4 (four) times daily as needed (PALPITATIONS HEARTRATE HIGHER THAN 120 BPM). (Patient not taking: Reported on 06/08/2022) 90 tablet 0   metoprolol tartrate (LOPRESSOR) 25 MG tablet Take 1 tablet (25 mg total) by mouth as needed (For Palpitations). 30 tablet 3   No current facility-administered medications for this visit.    Allergies:   Ciprofloxacin, Macrodantin [nitrofurantoin macrocrystal], Sulfa antibiotics, Atorvastatin, and Epinephrine   Social History:  The patient  reports that she has never smoked. She has been exposed to tobacco smoke. She has never used smokeless tobacco. She reports that she does not drink alcohol and does not  use drugs.   Family History:  The patient's family history includes Diabetes in her father and mother; Hypertension in her father and mother; Kidney disease in her mother; Migraines in her maternal grandmother, son, and son.   ROS:  Please see the history of present illness.   Otherwise, review of systems is positive for none.   All other systems are reviewed and negative.    PHYSICAL EXAM: VS:  BP 132/82   Pulse 72   Ht  (1.727 m)   Wt 173 lb (78.5 kg)   SpO2 96%   BMI 26.30 kg/m  , BMI Body mass index is 26.3 kg/m. GEN: Well nourished, well developed, in no acute distress  HEENT: normal  Neck: no JVD, carotid bruits, or masses Cardiac: RRR; no murmurs, rubs, or gallops,no edema  Respiratory:  clear to auscultation bilaterally, normal work of breathing GI: soft, nontender, nondistended, + BS MS: no deformity or atrophy  Skin: warm and dry Neuro:  Strength and sensation are intact Psych: euthymic mood, full affect  EKG:  EKG is not ordered today. Personal review of the ekg ordered 05/10/22 shows sinus rhythm   Recent Labs: 08/02/2021: TSH 2.223 08/30/2021: Magnesium 1.8 09/10/2021: ALT 64; BUN 14; Creatinine, Ser 0.97; Hemoglobin 13.3; Platelets 311; Potassium 3.8; Sodium 138    Lipid Panel     Component Value Date/Time   CHOL  11/05/2007 0821    136        ATP III CLASSIFICATION:  <200     mg/dL   Desirable  161-096  mg/dL   Borderline High  >=045    mg/dL   High   TRIG 409 81/19/1478 0821   HDL 36 (L) 11/05/2007 0821   CHOLHDL 3.8 11/05/2007 0821   VLDL 29 11/05/2007 0821   LDLCALC  11/05/2007 0821    71        Total Cholesterol/HDL:CHD Risk Coronary Heart Disease Risk Table                     Men   Women  1/2 Average Risk   3.4   3.3     Wt Readings from Last 3 Encounters:  07/23/22 173 lb (78.5 kg)  06/08/22 171 lb 6.4 oz (77.7 kg)  05/10/22 172 lb (78 kg)      Other studies Reviewed: Additional studies/ records that were reviewed today include: Monitor 10/08/19  Review of the above records today demonstrates:  The dominant rhythm is normal sinus with normal circadian variation. There are rare episodes of very brief nonsustained ventricular tachycardia, 3-5 beats. There are rare episodes of brief nonsustained atrial tachycardia, 3-7 beats. The patient triggered recordings are almost all associated with abnormal rhythm,  usually isolated PACs and PVCs No significant episodes of bradycardia or pauses are recorded. There is no atrial fibrillation.    ASSESSMENT AND PLAN:  1.  Paroxysmal atrial fibrillation/atrial tachycardia: Currently on Eliquis and Toprol-XL.  CHA2DS2-VASc of 3.  Status post ablation 07/13/2021.  Remained in sinus rhythm.  She has intermittent palpitations that last up to 5 seconds at a time but nothing that is prolonged.  Danayah Smyre continue with current management.  2.  Hypertension: Currently well-controlled  3.  Secondary hypercoagulable state: Currently on Eliquis for atrial fibrillation.    Current medicines are reviewed at length with the patient today.   The patient does not have concerns regarding her medicines.  The following changes were made today: None  Labs/ tests ordered today include:  No orders of the defined types were placed in this encounter.    Disposition:   FU 6 months  Signed, Nickolette Espinola Jorja Loa, MD  07/23/2022 11:40 AM     [facility] [address] [place] [address] [phone] (office) [phone] (fax)

## 2022-07-23 NOTE — Patient Instructions (Addendum)
Medication Instructions:  Your physician recommends that you continue on your current medications as directed. Please refer to the Current Medication list given to you today.  *If you need a refill on your cardiac medications before your next appointment, please call your pharmacy*   Lab Work: None ordered   Testing/Procedures: None ordered   Follow-Up: At [facility], you and your health needs are our priority.  As part of our continuing mission to provide you with exceptional heart care, we have created designated Provider Care Teams.  These Care Teams include your primary Cardiologist (physician) and Advanced Practice Providers (APPs -  Physician Assistants and Nurse Practitioners) who all work together to provide you with the care you need, when you need it.  Your next appointment:   6 month(s)  The format for your next appointment:   In Person  Provider:   Renee Ursuy, PA-C    Thank you for choosing [facility]!!   Sugey Trevathan, RN [phone]     

## 2022-07-26 ENCOUNTER — Other Ambulatory Visit: Payer: Self-pay | Admitting: Cardiovascular Disease

## 2022-07-30 DIAGNOSIS — N390 Urinary tract infection, site not specified: Secondary | ICD-10-CM | POA: Diagnosis not present

## 2022-08-09 ENCOUNTER — Ambulatory Visit (HOSPITAL_BASED_OUTPATIENT_CLINIC_OR_DEPARTMENT_OTHER): Payer: PPO

## 2022-08-13 IMAGING — DX DG CHEST 1V PORT
1 series · 1 of 1 positions shown · non-contrast
Comparison: CT chest dated 11/29/2020

CLINICAL DATA: Dyspnea

EXAM:
PORTABLE CHEST 1 VIEW

[chest]
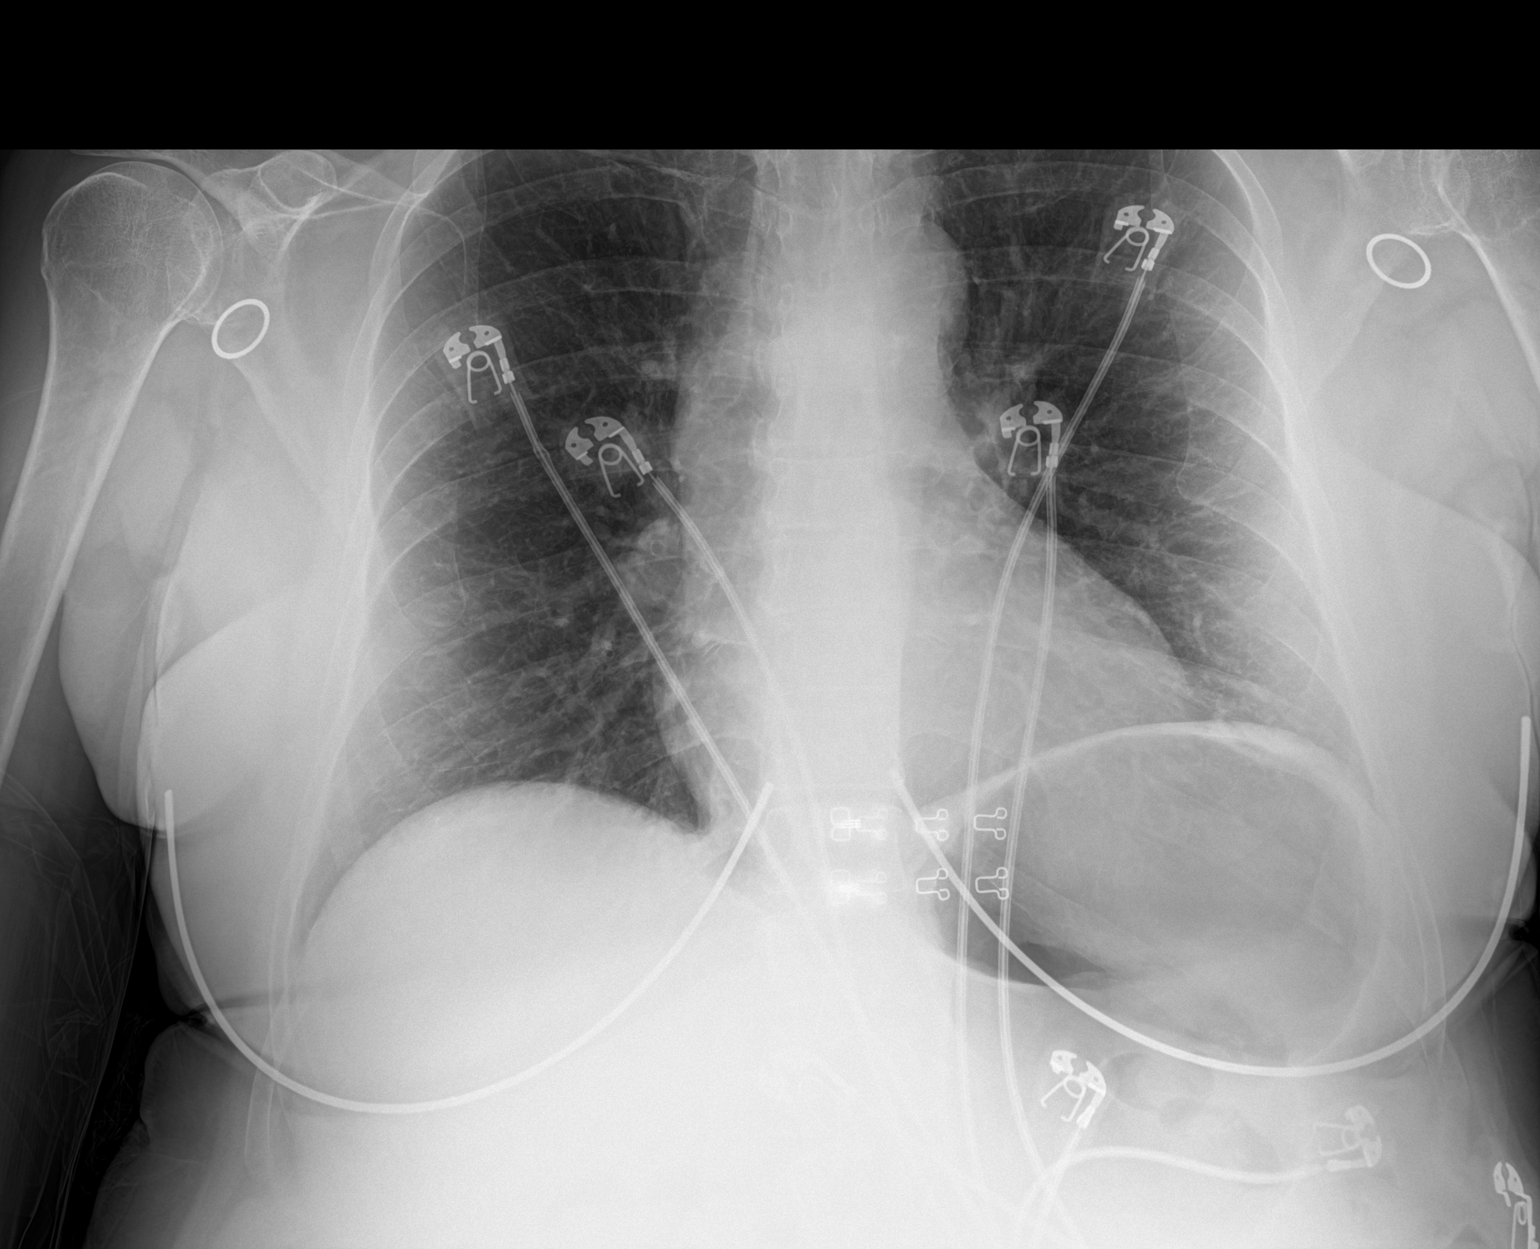

[1 of 1 positions shown; findings below may reference images not displayed]

FINDINGS: Lungs are clear.  No pleural effusion or pneumothorax.

The heart is normal in size.
IMPRESSION: No evidence of acute cardiopulmonary disease.

## 2022-09-13 ENCOUNTER — Ambulatory Visit (HOSPITAL_BASED_OUTPATIENT_CLINIC_OR_DEPARTMENT_OTHER): Payer: PPO

## 2022-09-19 ENCOUNTER — Ambulatory Visit (HOSPITAL_BASED_OUTPATIENT_CLINIC_OR_DEPARTMENT_OTHER)
Admission: RE | Admit: 2022-09-19 | Discharge: 2022-09-19 | Disposition: A | Discharge status: Home / Self Care | Payer: PPO | Source: Ambulatory Visit | Attending: Pulmonary Disease | Admitting: Pulmonary Disease

## 2022-09-19 DIAGNOSIS — J849 Interstitial pulmonary disease, unspecified: Secondary | ICD-10-CM | POA: Insufficient documentation

## 2022-09-19 DIAGNOSIS — R918 Other nonspecific abnormal finding of lung field: Secondary | ICD-10-CM | POA: Diagnosis not present

## 2022-09-19 DIAGNOSIS — J84112 Idiopathic pulmonary fibrosis: Secondary | ICD-10-CM | POA: Diagnosis not present

## 2022-09-20 ENCOUNTER — Telehealth: Payer: Self-pay | Admitting: Pulmonary Disease

## 2022-09-20 NOTE — Telephone Encounter (Signed)
Pt. Calling to go over her lab results and to go over CT please advise

## 2022-09-21 NOTE — Telephone Encounter (Signed)
Spoke with patient. Advised CT scan has not been read by radiologist yet, but someone would call to discuss results once there available. She verbalized understanding. NFN

## 2022-09-25 ENCOUNTER — Telehealth: Payer: Self-pay | Admitting: Cardiovascular Disease

## 2022-09-25 ENCOUNTER — Telehealth: Payer: Self-pay | Admitting: Pulmonary Disease

## 2022-09-25 DIAGNOSIS — J849 Interstitial pulmonary disease, unspecified: Secondary | ICD-10-CM

## 2022-09-25 NOTE — Telephone Encounter (Signed)
Callling to discuss her aorta being in large. Please advise

## 2022-09-25 NOTE — Telephone Encounter (Signed)
Please tell her that I am not concerned: it is just a problem that we need to monitor on a yearly basis. 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  It measured 4.0 cm in 2021, 4.2 cm in 2022 and 2023 and is now 4.4 cm.  In addition, we generally tend to overestimate the diameter of the aorta on studies that are performed without intravenous contrast, such as the pulmonary scan that she just had done.  The studies performed with intravenous contrast are more accurate.  We generally do not recommend surgery for ascending aortic aneurysms until they are well over 5.0-5.5 cm.  Will continue to monitor it on a yearly basis.

## 2022-09-25 NOTE — Telephone Encounter (Signed)
Patient had CT with Dr. Isaiah Serge and she would like you to review the results. She stated her aorta has been enlarged but the CT show an increase in size. She would like to discuss with provider.

## 2022-09-25 NOTE — Telephone Encounter (Signed)
Patient aware of provider recommendation. She verbalized understanding

## 2022-09-25 NOTE — Telephone Encounter (Signed)
Pt calling in for someone to go over CT results

## 2022-09-26 NOTE — Telephone Encounter (Signed)
Pt calling back for CT Results  

## 2022-09-27 NOTE — Telephone Encounter (Signed)
PT called upset she can not get a call back on her results. States she understands she has a FU appt w/a NP to review those but feels someone should be calling her sooner since she has some concerns. Please call @ [phone]  Also, she had some bloodwork in March that no one has called her on and that shows an abnormality.  Another test done, a CT that Dr. Isaiah Serge ordered, that  she had to call her cardiologist because no one from our office called her.

## 2022-09-27 NOTE — Telephone Encounter (Signed)
I called and spoke with the pt  She is asking for CT Chest and lab results  She has appt with Maralyn Sago in July 2024 to go over the results, but wants them now  She is anxious about results  Please advise, thanks!

## 2022-10-01 ENCOUNTER — Emergency Department (HOSPITAL_BASED_OUTPATIENT_CLINIC_OR_DEPARTMENT_OTHER): Payer: PPO

## 2022-10-01 ENCOUNTER — Encounter (HOSPITAL_BASED_OUTPATIENT_CLINIC_OR_DEPARTMENT_OTHER): Payer: Self-pay

## 2022-10-01 ENCOUNTER — Emergency Department (HOSPITAL_BASED_OUTPATIENT_CLINIC_OR_DEPARTMENT_OTHER)
Admission: EM | Admit: 2022-10-01 | Discharge: 2022-10-01 | Disposition: A | Discharge status: Home / Self Care | Payer: PPO | Attending: Emergency Medicine | Admitting: Emergency Medicine

## 2022-10-01 ENCOUNTER — Other Ambulatory Visit: Payer: Self-pay

## 2022-10-01 DIAGNOSIS — Z7901 Long term (current) use of anticoagulants: Secondary | ICD-10-CM | POA: Insufficient documentation

## 2022-10-01 DIAGNOSIS — R140 Abdominal distension (gaseous): Secondary | ICD-10-CM | POA: Diagnosis not present

## 2022-10-01 DIAGNOSIS — R918 Other nonspecific abnormal finding of lung field: Secondary | ICD-10-CM | POA: Diagnosis not present

## 2022-10-01 DIAGNOSIS — M549 Dorsalgia, unspecified: Secondary | ICD-10-CM | POA: Insufficient documentation

## 2022-10-01 DIAGNOSIS — Z7984 Long term (current) use of oral hypoglycemic drugs: Secondary | ICD-10-CM | POA: Diagnosis not present

## 2022-10-01 DIAGNOSIS — M25512 Pain in left shoulder: Secondary | ICD-10-CM | POA: Diagnosis not present

## 2022-10-01 DIAGNOSIS — I701 Atherosclerosis of renal artery: Secondary | ICD-10-CM | POA: Diagnosis not present

## 2022-10-01 DIAGNOSIS — R790 Abnormal level of blood mineral: Secondary | ICD-10-CM | POA: Insufficient documentation

## 2022-10-01 DIAGNOSIS — R0789 Other chest pain: Secondary | ICD-10-CM | POA: Diagnosis not present

## 2022-10-01 DIAGNOSIS — I10 Essential (primary) hypertension: Secondary | ICD-10-CM | POA: Diagnosis not present

## 2022-10-01 DIAGNOSIS — Z79899 Other long term (current) drug therapy: Secondary | ICD-10-CM | POA: Diagnosis not present

## 2022-10-01 DIAGNOSIS — I700 Atherosclerosis of aorta: Secondary | ICD-10-CM | POA: Diagnosis not present

## 2022-10-01 DIAGNOSIS — R0600 Dyspnea, unspecified: Secondary | ICD-10-CM | POA: Diagnosis not present

## 2022-10-01 DIAGNOSIS — I480 Paroxysmal atrial fibrillation: Secondary | ICD-10-CM | POA: Insufficient documentation

## 2022-10-01 DIAGNOSIS — R079 Chest pain, unspecified: Secondary | ICD-10-CM

## 2022-10-01 DIAGNOSIS — N83202 Unspecified ovarian cyst, left side: Secondary | ICD-10-CM | POA: Diagnosis not present

## 2022-10-01 DIAGNOSIS — R0602 Shortness of breath: Secondary | ICD-10-CM | POA: Diagnosis not present

## 2022-10-01 DIAGNOSIS — R9389 Abnormal findings on diagnostic imaging of other specified body structures: Secondary | ICD-10-CM | POA: Diagnosis not present

## 2022-10-01 LAB — CBC
HCT: 40.1 % (ref 36.0–46.0)
Hemoglobin: 13 g/dL (ref 12.0–15.0)
MCH: 30.2 pg (ref 26.0–34.0)
MCHC: 32.4 g/dL (ref 30.0–36.0)
MCV: 93.3 fL (ref 80.0–100.0)
Platelets: 244 10*3/uL (ref 150–400)
RBC: 4.3 MIL/uL (ref 3.87–5.11)
RDW: 13.5 % (ref 11.5–15.5)
WBC: 7.6 10*3/uL (ref 4.0–10.5)
nRBC: 0 % (ref 0.0–0.2)

## 2022-10-01 LAB — HEPATIC FUNCTION PANEL
ALT: 60 U/L — ABNORMAL HIGH (ref 0–44)
AST: 58 U/L — ABNORMAL HIGH (ref 15–41)
Albumin: 4.6 g/dL (ref 3.5–5.0)
Alkaline Phosphatase: 111 U/L (ref 38–126)
Bilirubin, Direct: 0.2 mg/dL (ref 0.0–0.2)
Indirect Bilirubin: 0.5 mg/dL (ref 0.3–0.9)
Total Bilirubin: 0.7 mg/dL (ref 0.3–1.2)
Total Protein: 7.5 g/dL (ref 6.5–8.1)

## 2022-10-01 LAB — BASIC METABOLIC PANEL
Anion gap: 10 (ref 5–15)
BUN: <5 mg/dL — ABNORMAL LOW (ref 8–23)
CO2: 28 mmol/L (ref 22–32)
Calcium: 10.3 mg/dL (ref 8.9–10.3)
Chloride: 102 mmol/L (ref 98–111)
Creatinine, Ser: 0.82 mg/dL (ref 0.44–1.00)
GFR, Estimated: >60 mL/min (ref >60)
Glucose, Bld: 140 mg/dL — ABNORMAL HIGH (ref 70–99)
Potassium: 3.7 mmol/L (ref 3.5–5.1)
Sodium: 140 mmol/L (ref 135–145)

## 2022-10-01 LAB — BRAIN NATRIURETIC PEPTIDE: B Natriuretic Peptide: 127.2 pg/mL — ABNORMAL HIGH (ref 0.0–100.0)

## 2022-10-01 LAB — TROPONIN I (HIGH SENSITIVITY)
Troponin I (High Sensitivity): 3 ng/L (ref <18)
Troponin I (High Sensitivity): 3 ng/L (ref <18)

## 2022-10-01 MED ORDER — IOHEXOL 350 MG/ML SOLN
100.0000 mL | Freq: Once | INTRAVENOUS | Status: AC | PRN
  Administered 2022-10-01: 85 mL via INTRAVENOUS

## 2022-10-01 NOTE — ED Notes (Signed)
Patient ambulatory to restroom  ?

## 2022-10-01 NOTE — ED Triage Notes (Signed)
Patient here POV from Home.  Endorses SOB Intermittently for 1 Week. Noted Pain to left arm and Back that began 1 Hour ago. No N/V. No CP. No fevers.   NAD Noted during Triage. A&Ox4. Gcs 15. Ambulatory.

## 2022-10-01 NOTE — ED Notes (Signed)
All appropriate discharge materials reviewed at length with patient. Time for questions provided. Pt has no other questions at this time and verbalizes understanding of all provided materials.  

## 2022-10-01 NOTE — ED Provider Notes (Signed)
[place] EMERGENCY DEPARTMENT AT [facility] Provider Note   CSN: [id number] Arrival date & time: 10/01/22  1646     History {Add pertinent medical, surgical, social history, OB history to HPI:1} Chief Complaint  Patient presents with   Shortness of Breath    Amanda Frost is a 72 y.o. female.  HPI      72 year old female with a history of liver cirrhosis secondary to NASH, paroxysmal atrial fibrillation status post ablation April 2023, on Eliquis, hypertension who presents with concern for shortness of breath, left arm and back pain.  Dr. Caren Griffins Pulmonology  Family stress, dog died  Shortness of breath began a week ago, today had pain in back and left shoulder Shoulder almost felt muscular because was carrying something , at first felt like a stabbing in back and then was in shoulder aching.  Lasted about 1.5 hours.  Trying to sit still and see if got better.  Was in shoulder then to the top part of chest then went away.  If moving it would be worse, specifically arm movemetns. 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  No leg pain or swelling Dyspnea did not worsen during the episode of arm pain Pain not worse with deep breaths No nausea, vomitng, sweating or abdominal pain No fever, no cough, congestion , sore throat  Dyspnea, worse when eating , comes at odd times.  Might be right after dinner or wake up with. Not exertional.      Past Medical History:  Diagnosis Date   Hypertension    Liver cirrhosis secondary to NASH (nonalcoholic steatohepatitis) (HCC)    Ocular migraine    Palpitations    PAC's,PVC's,atrial tachycardia   Paroxysmal atrial fibrillation (HCC)      Home Medications Prior to Admission medications   Medication Sig Start Date End Date Taking? Authorizing Provider  adapalene (DIFFERIN) 0.1 % cream Apply 1 application. topically daily as needed (acne). Patient not taking: Reported on 06/08/2022    [provider]  apixaban (ELIQUIS) 5 MG  TABS tablet Take 1 tablet (5 mg total) by mouth 2 (two) times daily. 05/10/22   Swinyer, Zachary George, NP  Cholecalciferol (VITAMIN D3) 5000 units CAPS Take 10,000 Units by mouth every Monday, Tuesday, Wednesday, Thursday, and Friday.    [provider]  diltiazem (CARDIZEM) 30 MG tablet Take 1 tablet (30 mg total) by mouth 4 (four) times daily as needed (PALPITATIONS HEARTRATE HIGHER THAN 120 BPM). Patient not taking: Reported on 06/08/2022 08/31/21   Sheilah Pigeon, PA-C  metFORMIN (GLUCOPHAGE-XR) 500 MG 24 hr tablet Take 500 mg by mouth daily with breakfast.    [provider]  metoprolol succinate (TOPROL-XL) 50 MG 24 hr tablet TAKING 1.5 TABLET BY MOUTH TWICE DAILY 11/01/21   Croitoru, Mihai, MD  metoprolol tartrate (LOPRESSOR) 25 MG tablet TAKE ONE TABLET DAILY AS NEEDED FOR PALPITATIONS. 07/26/22   Croitoru, Mihai, MD  olmesartan (BENICAR) 20 MG tablet Take 5 mg by mouth daily. Per patient taking 0.5 (1/4) tab of 20 mg 12/18/21   [provider]      Allergies    Ciprofloxacin, Macrodantin [nitrofurantoin macrocrystal], Sulfa antibiotics, Atorvastatin, and Epinephrine    Review of Systems   Review of Systems  Physical Exam Updated Vital Signs BP (!) 189/78 (BP Location: Right Arm)   Pulse 83   Temp 98.4 F (36.9 C)   Resp 18   Ht 5\' 8"  (1.727 m)   Wt 76.2 kg   SpO2 98%  BMI 25.54 kg/m  Physical Exam Vitals and nursing note reviewed.  Constitutional:      General: She is not in acute distress.    Appearance: She is well-developed. She is not diaphoretic.  HENT:     Head: Normocephalic and atraumatic.  Eyes:     Conjunctiva/sclera: Conjunctivae normal.  Cardiovascular:     Rate and Rhythm: Normal rate and regular rhythm.     Heart sounds: Normal heart sounds. No murmur heard.    No friction rub. No gallop.  Pulmonary:     Effort: Pulmonary effort is normal. No respiratory distress.     Breath sounds: Normal breath sounds. No wheezing or rales.   Abdominal:     General: There is no distension.     Palpations: Abdomen is soft.     Tenderness: There is no abdominal tenderness. There is no guarding.  Musculoskeletal:        General: No tenderness.     Cervical back: Normal range of motion.  Skin:    General: Skin is warm and dry.     Findings: No erythema or rash.  Neurological:     Mental Status: She is alert and oriented to person, place, and time.     ED Results / Procedures / Treatments   Labs (all labs ordered are listed, but only abnormal results are displayed) Labs Reviewed  BASIC METABOLIC PANEL  CBC  TROPONIN I (HIGH SENSITIVITY)    EKG None  Radiology No results found.  Procedures Procedures  {Document cardiac monitor, telemetry assessment procedure when appropriate:1}  Medications Ordered in ED Medications - No data to display  ED Course/ Medical Decision Making/ A&P   {   Click here for ABCD2, HEART and other calculatorsREFRESH Note before signing :1}                          Medical Decision Making Amount and/or Complexity of Data Reviewed Labs: ordered. Radiology: ordered.    72 year old female with a history of liver cirrhosis secondary to NASH, paroxysmal atrial fibrillation status post ablation April 2023, on Eliquis, hypertension who presents with concern for shortness of breath, left arm and back pain.   Labs completed and personally about interpreted by me show no significant electrolyte abnormalities, no anemia, no leukocytosis, normal troponin.  BNP mildly elevated at 127.   Chest x-ray shows no active cardiopulmonary disease  {Document critical care time when appropriate:1} {Document review of labs and clinical decision tools ie heart score, Chads2Vasc2 etc:1}  {Document your independent review of radiology images, and any outside records:1} {Document your discussion with family members, caretakers, and with consultants:1} {Document social determinants of health affecting pt's  care:1} {Document your decision making why or why not admission, treatments were needed:1} Final Clinical Impression(s) / ED Diagnoses Final diagnoses:  None    Rx / DC Orders ED Discharge Orders     None

## 2022-10-05 NOTE — Telephone Encounter (Signed)
Labs results were called in to her on 06/11/22 with telephone note- the message was that the labs show borderline elevation of factor called ANA that is likely non specific  Apologize for the delay in CT results as it was not read by the radiologist till 6/24 and I was away from office till now. Please let her know that the CT shows minimal progression compared to last scan. Can you please make a follow up appointment with me instead of APP to review the results.

## 2022-10-05 NOTE — Telephone Encounter (Signed)
Called patient but she did not answer. Left message for her to call us back.  

## 2022-10-05 NOTE — Telephone Encounter (Signed)
Called and spoke with patient. Read results to patient. She stated she had some other questions that she needed detail on, advised patient that her questions were for a provider to explain to her and she verbalized understanding.   Nothing further needed.

## 2022-10-09 NOTE — Addendum Note (Signed)
Addended by: Maurene Capes on: 10/09/2022 01:38 PM   Modules accepted: Orders

## 2022-10-09 NOTE — Telephone Encounter (Signed)
I called and discussed her scan with the patient and also her latest CT scan that she got in the ED. Please order PFTs in 3-4 months and follow-up with me in clinic.

## 2022-10-09 NOTE — Telephone Encounter (Signed)
Recall placed for PFT and follow up. Order placed for PFT.

## 2022-10-19 ENCOUNTER — Ambulatory Visit: Payer: PPO | Admitting: Acute Care

## 2022-10-27 ENCOUNTER — Other Ambulatory Visit (HOSPITAL_COMMUNITY): Payer: Self-pay | Admitting: Cardiovascular Disease

## 2022-10-31 ENCOUNTER — Other Ambulatory Visit (HOSPITAL_COMMUNITY): Payer: Self-pay | Admitting: Cardiovascular Disease

## 2022-10-31 ENCOUNTER — Telehealth: Payer: Self-pay | Admitting: Cardiovascular Disease

## 2022-10-31 NOTE — Telephone Encounter (Signed)
*  STAT* If patient is at the pharmacy, call can be transferred to refill team.   1. Which medications need to be refilled? (please list name of each medication and dose if known) metFORMIN (GLUCOPHAGE-XR) 500 MG 24 hr tablet   metoprolol succinate (TOPROL-XL) 50 MG 24 hr tablet   2. Which pharmacy/location (including street and city if local pharmacy) is medication to be sent to?  [id number] - [place], [place] - [address]    3. Do they need a 30 day or 90 day supply? 90

## 2022-11-01 MED ORDER — METOPROLOL TARTRATE 25 MG PO TABS: ORAL_TABLET | ORAL | 1 refills | Status: DC

## 2022-11-01 MED ORDER — METOPROLOL SUCCINATE ER 50 MG PO TB24: ORAL_TABLET | ORAL | 1 refills | Status: DC

## 2022-11-01 NOTE — Telephone Encounter (Signed)
Pt's medications were sent to pt's pharmacy as requested. Confirmation received.  

## 2022-11-20 DIAGNOSIS — E785 Hyperlipidemia, unspecified: Secondary | ICD-10-CM | POA: Diagnosis not present

## 2022-11-20 DIAGNOSIS — I10 Essential (primary) hypertension: Secondary | ICD-10-CM | POA: Diagnosis not present

## 2022-11-20 DIAGNOSIS — E1169 Type 2 diabetes mellitus with other specified complication: Secondary | ICD-10-CM | POA: Diagnosis not present

## 2022-11-20 DIAGNOSIS — E559 Vitamin D deficiency, unspecified: Secondary | ICD-10-CM | POA: Diagnosis not present

## 2022-11-20 DIAGNOSIS — Z1212 Encounter for screening for malignant neoplasm of rectum: Secondary | ICD-10-CM | POA: Diagnosis not present

## 2022-11-23 ENCOUNTER — Ambulatory Visit (HOSPITAL_COMMUNITY): Admission: RE | Admit: 2022-11-23 | Payer: PPO | Source: Ambulatory Visit | Admitting: Internal Medicine

## 2022-11-23 ENCOUNTER — Encounter (HOSPITAL_COMMUNITY): Payer: Self-pay | Admitting: Internal Medicine

## 2022-11-23 ENCOUNTER — Inpatient Hospital Stay (HOSPITAL_COMMUNITY)
Admission: RE | Admit: 2022-11-23 | Discharge: 2022-11-23 | Disposition: A | Discharge status: Home / Self Care | Payer: PPO | Source: Ambulatory Visit | Attending: Internal Medicine | Admitting: Internal Medicine

## 2022-11-23 VITALS — BP 156/100 | HR 85 | Ht 68.0 in | Wt 172.8 lb

## 2022-11-23 DIAGNOSIS — Z79899 Other long term (current) drug therapy: Secondary | ICD-10-CM | POA: Insufficient documentation

## 2022-11-23 DIAGNOSIS — I4719 Other supraventricular tachycardia: Secondary | ICD-10-CM

## 2022-11-23 DIAGNOSIS — I480 Paroxysmal atrial fibrillation: Secondary | ICD-10-CM

## 2022-11-23 DIAGNOSIS — Z7901 Long term (current) use of anticoagulants: Secondary | ICD-10-CM | POA: Insufficient documentation

## 2022-11-23 DIAGNOSIS — R002 Palpitations: Secondary | ICD-10-CM

## 2022-11-23 DIAGNOSIS — I119 Hypertensive heart disease without heart failure: Secondary | ICD-10-CM | POA: Insufficient documentation

## 2022-11-23 MED ORDER — METOPROLOL SUCCINATE ER 100 MG PO TB24: 100.0000 mg | ORAL_TABLET | Freq: Two times a day (BID) | ORAL | Status: DC

## 2022-11-23 NOTE — Patient Instructions (Signed)
Increase metoprolol to 100 mg twice a day.   

## 2022-11-23 NOTE — Progress Notes (Signed)
Primary Care Physician: Rodrigo Ran, MD Referring Physician: Dr. Hulen Skains Amanda Frost is a 72 y.o. female with a h/o HTN, PAF, that recently underwent afib ablation 07/12/21. She asked to be seen today as she has noted increased heart rate since the ablation. BB recently increased. She is in SR and has not noted any afib.   No swallowing or groin issues since the procedure. She has noted some elevation of BP since procedure. She is watching this at home. She returned to work this week. She has noted more energy since the procedure.   F/u afib clinic5/3/23,  as pt wanted to be seen today for noticing some palpitations since yesterday. EKG shows SR. By her description sounds like palpitations. She has had a lot of family stress. TSH noted to be elevated 5 months ago, will recheck.  Follow up in the AF clinic 09/04/21. Patient was seen at the ED 08/30/21 with rapid afib and underwent DCCV at that time. She had an office visit with Francis Dowse the following day and was started on PRN diltiazem. Patient has done well over the past several days with no interim afib. No bleeding issues on anticoagulation.   F/u in Afib clinic, 11/23/22. She is currently in NSR. She contacted on-call cardiologist last night due to feeling intermittent heart palpitations. She thought she was back in Afib but not sure. She states she could feel intermittent "hard" heartbeats going on throughout the night. She feels them occurring today but not as frequently. She did take an extra 25 mg of metoprolol after speaking with on-call staff.  Today, she denies symptoms of chest pain, shortness of breath, orthopnea, PND, lower extremity edema, dizziness, presyncope, syncope, or neurologic sequela. The patient is tolerating medications without difficulties and is otherwise without complaint today.   Past Medical History:  Diagnosis Date   Hypertension    Liver cirrhosis secondary to NASH (nonalcoholic steatohepatitis) (HCC)     Ocular migraine    Palpitations    PAC's,PVC's,atrial tachycardia   Paroxysmal atrial fibrillation (HCC)    Past Surgical History:  Procedure Laterality Date   ATRIAL FIBRILLATION ABLATION N/A 07/12/2021   Procedure: ATRIAL FIBRILLATION ABLATION;  Surgeon: Regan Lemming, MD;  Location: [facility] INVASIVE CV LAB;  Service: Cardiovascular;  Laterality: N/A;   BREAST BIOPSY  1994   CARDIAC CATHETERIZATION  11/05/2007   normal LV fx,patent coronary arteries   NM MYOVIEW LTD  03/12/2005   normal   US ECHOCARDIOGRAPHY  11/26/2007   mild LVH,trace MR,TR    Current Outpatient Medications  Medication Sig Dispense Refill   adapalene (DIFFERIN) 0.1 % cream Apply 1 application  topically daily as needed (acne).     apixaban (ELIQUIS) 5 MG TABS tablet Take 1 tablet (5 mg total) by mouth 2 (two) times daily. 60 tablet 11   Cholecalciferol (VITAMIN D3) 5000 units CAPS Take 10,000 Units by mouth every Monday, Tuesday, Wednesday, Thursday, and Friday.     diltiazem (CARDIZEM) 30 MG tablet Take 1 tablet (30 mg total) by mouth 4 (four) times daily as needed (PALPITATIONS HEARTRATE HIGHER THAN 120 BPM). 90 tablet 0   metFORMIN (GLUCOPHAGE-XR) 500 MG 24 hr tablet Take 500 mg by mouth daily with breakfast.     metoprolol tartrate (LOPRESSOR) 25 MG tablet TAKE ONE TABLET DAILY AS NEEDED FOR PALPITATIONS 90 tablet 1   olmesartan (BENICAR) 5 MG tablet Take 5 mg by mouth at bedtime.     metoprolol succinate (TOPROL-XL) 100 MG  24 hr tablet Take 1 tablet (100 mg total) by mouth 2 (two) times daily.     No current facility-administered medications for this encounter.    Allergies  Allergen Reactions   Ciprofloxacin Palpitations and Other (See Comments)    AFIB     Macrodantin [Nitrofurantoin Macrocrystal] Hives   Sulfa Antibiotics Hives   Atorvastatin     joints hurt too much   Epinephrine     Heart races   ROS- All systems are reviewed and negative except as per the HPI above  Physical  Exam: Vitals:   11/23/22 0904  BP: (!) 156/100  Pulse: 85  Weight: 78.4 kg  Height: 5\' 8"  (1.727 m)     Wt Readings from Last 3 Encounters:  11/23/22 78.4 kg  10/01/22 76.2 kg  07/23/22 78.5 kg    Labs: Lab Results  Component Value Date   NA 140 10/01/2022   K 3.7 10/01/2022   CL 102 10/01/2022   CO2 28 10/01/2022   GLUCOSE 140 (H) 10/01/2022   BUN <5 (L) 10/01/2022   CREATININE 0.82 10/01/2022   CALCIUM 10.3 10/01/2022   MG 1.8 08/30/2021   Lab Results  Component Value Date   INR 1.1 07/14/2021   Lab Results  Component Value Date   CHOL  11/05/2007    136        ATP III CLASSIFICATION:  <200     mg/dL   Desirable  914-782  mg/dL   Borderline High  >=956    mg/dL   High   HDL 36 (L) 21/30/8657   LDLCALC  11/05/2007    71        Total Cholesterol/HDL:CHD Risk Coronary Heart Disease Risk Table                     Men   Women  1/2 Average Risk   3.4   3.3   TRIG 145 11/05/2007   GEN- The patient is well appearing, alert and oriented x 3 today.   Neck - no JVD or carotid bruit noted Lungs- Clear to ausculation bilaterally, normal work of breathing Heart- Regular rate and rhythm, no murmurs, rubs or gallops, PMI not laterally displaced Extremities- no clubbing, cyanosis, or edema Skin - no rash or ecchymosis noted  EKG- Vent. rate 85 BPM PR interval 150 ms QRS duration 88 ms QT/QTcB 376/447 ms P-R-Amanda axes 20 28 47 Sinus rhythm with occasional Premature ventricular complexes Otherwise normal ECG When compared with ECG of 01-Oct-2022 16:54, PREVIOUS ECG IS PRESENT  Assessment and Plan:  1. Paroxysmal Afib/atrial tachycardia S/p ablation 07/12/21 S/p DCCV 08/30/21 Patient in SR today.   Will place 3 day monitor to rule out Afib or other arrhythmia and determine ectopy burden; suspicious she could be feeling PVCs. Due to ongoing symptoms, will have patient increase metoprolol to 100 mg BID.   2. HTN Stable, no changes today.    3. CHA2DS2VASc  score  of 4 Continue eliquis 5 mg bid without interruption for 3 months post ablation.    Will call with monitor results. Follow up with Francis Dowse, PA-C, as scheduled.   Lake Bells, PA-C Af[facility] [address] [place], [place] [address] [phone]

## 2022-11-27 DIAGNOSIS — Z7901 Long term (current) use of anticoagulants: Secondary | ICD-10-CM | POA: Diagnosis not present

## 2022-11-27 DIAGNOSIS — I700 Atherosclerosis of aorta: Secondary | ICD-10-CM | POA: Diagnosis not present

## 2022-11-27 DIAGNOSIS — E1169 Type 2 diabetes mellitus with other specified complication: Secondary | ICD-10-CM | POA: Diagnosis not present

## 2022-11-27 DIAGNOSIS — M791 Myalgia, unspecified site: Secondary | ICD-10-CM | POA: Diagnosis not present

## 2022-11-27 DIAGNOSIS — K760 Fatty (change of) liver, not elsewhere classified: Secondary | ICD-10-CM | POA: Diagnosis not present

## 2022-11-27 DIAGNOSIS — I480 Paroxysmal atrial fibrillation: Secondary | ICD-10-CM | POA: Diagnosis not present

## 2022-11-27 DIAGNOSIS — I10 Essential (primary) hypertension: Secondary | ICD-10-CM | POA: Diagnosis not present

## 2022-11-27 DIAGNOSIS — R9389 Abnormal findings on diagnostic imaging of other specified body structures: Secondary | ICD-10-CM | POA: Diagnosis not present

## 2022-11-27 DIAGNOSIS — M810 Age-related osteoporosis without current pathological fracture: Secondary | ICD-10-CM | POA: Diagnosis not present

## 2022-11-27 DIAGNOSIS — D6869 Other thrombophilia: Secondary | ICD-10-CM | POA: Diagnosis not present

## 2022-11-27 DIAGNOSIS — E785 Hyperlipidemia, unspecified: Secondary | ICD-10-CM | POA: Diagnosis not present

## 2022-11-27 DIAGNOSIS — Z0000 Encounter for general adult medical examination without abnormal findings: Secondary | ICD-10-CM | POA: Diagnosis not present

## 2022-11-29 DIAGNOSIS — R002 Palpitations: Secondary | ICD-10-CM | POA: Diagnosis not present

## 2022-11-29 DIAGNOSIS — R82998 Other abnormal findings in urine: Secondary | ICD-10-CM | POA: Diagnosis not present

## 2022-12-04 ENCOUNTER — Other Ambulatory Visit (HOSPITAL_COMMUNITY): Payer: Self-pay | Admitting: *Deleted

## 2022-12-04 MED ORDER — METOPROLOL SUCCINATE ER 100 MG PO TB24: 100.0000 mg | ORAL_TABLET | Freq: Two times a day (BID) | ORAL | 6 refills | Status: DC

## 2022-12-16 ENCOUNTER — Telehealth: Payer: Self-pay | Admitting: Internal Medicine

## 2022-12-16 DIAGNOSIS — I480 Paroxysmal atrial fibrillation: Secondary | ICD-10-CM

## 2022-12-16 DIAGNOSIS — I4719 Other supraventricular tachycardia: Secondary | ICD-10-CM

## 2022-12-16 MED ORDER — DILTIAZEM HCL 30 MG PO TABS
30.0000 mg | ORAL_TABLET | Freq: Four times a day (QID) | ORAL | 0 refills | Status: DC | PRN
Start: 2022-12-16 — End: 2023-04-26

## 2022-12-16 MED ORDER — DILTIAZEM HCL 30 MG PO TABS
30.0000 mg | ORAL_TABLET | Freq: Four times a day (QID) | ORAL | 0 refills | Status: DC | PRN
Start: 2022-12-16 — End: 2022-12-16

## 2022-12-16 NOTE — Telephone Encounter (Signed)
Received patient page, called, regarding episode of palpitations. Patient reports frequent sensation of skipped beats and racing beats. It occurred last night for about one hour, and then again today for about one hour. This is happening more frequently than during when she recently had her heart monitor. Her palpitations improved after her metoprolol succinate.  When she is having palpitations, she may feel slightly lightheaded for a few seconds. She has taken her Bps and they are normal. She is generally feeling nervous about this. Her heart rate has been < 100 bpm.   Patient is prescribed metoprolol tartrate and diltiazem to take as needed for palpitations, but reports she never filled these.   Based on recent 3 day monitor, suspect patient is either having short bursts of SVT. No acute indications for ED visit at this time.   Plan: -refilling the diltiazem 30mg  q6h prn palpitations, start this evening -recommended patient follow up with her cardiology team within the next week -Patient given strict ED precautions to go if having consistent HR > 110  Achille Rich, MD Cardiology

## 2022-12-17 ENCOUNTER — Telehealth: Payer: Self-pay | Admitting: Cardiology

## 2022-12-17 NOTE — Telephone Encounter (Signed)
Patient c/o Palpitations:  High priority if patient c/o lightheadedness, shortness of breath, or chest pain  How long have you had palpitations/irregular HR/ Afib? Are you having the symptoms now?  Patient states she had very strong palpitations last night - felt like afib wasn't continuous, but lasted about 3 hours. Patient states she is still having palpitations sporadically this morning - not as severe as last night .  Are you currently experiencing lightheadedness, SOB or CP?  No   Do you have a history of afib (atrial fibrillation) or irregular heart rhythm?  Yes   Have you checked your BP or HR? (document readings if available):  9/16: 135/85 58  Are you experiencing any other symptoms?  No

## 2022-12-17 NOTE — Telephone Encounter (Signed)
Pt called to report that she is taking Toprol XL 100 mg BID... she called the MD on call last night since she was having strong palpitations... each time lately they have  lasted 2-3 hours... the MD on call sent in Diltiazem but she did not pick up due to her worry about it interacted with her Eliquis.  She thinks it is getting worse than when she wore her monitor back in 11/2022. More frequent and lasting longer. No dizziness, SOB... but if she develops symptoms she will go to the ED, someone else will drive her.

## 2022-12-18 NOTE — Telephone Encounter (Signed)
Patient called again to follow-up on next steps regarding her palpitations.

## 2022-12-18 NOTE — Telephone Encounter (Signed)
Spoke with patient and she states she has been having palpitations. Lasting 2-3 hours.  She has not started taking the diltiazem 30 mg prn. She also would like to discuss further her heart monitor in further detail. She is feeling better today, no palpitations but states the palpitations are concerning to her.

## 2022-12-19 NOTE — Telephone Encounter (Signed)
Late Entry:   Spoke to pt around 6pm last night, call lasted 28 minutes. Pt is very anxious and wants to ensure she is not in danger of dying because of heart rhythm abnormalities. Discussed preliminary monitor findings. Will discuss monitor findings further w/ MD. Aware will follow up next week. Offered some  information on Kardia mobile and recommended it to pt.  Patient verbalized understanding and agreeable to plan.

## 2023-01-01 NOTE — Telephone Encounter (Signed)
Pt called in for an update. She states she was supposed to get a c/b about monitor results

## 2023-01-01 NOTE — Telephone Encounter (Signed)
Called pt.  She did purchase Kardia mobile and will use it for aiding in monitoring things. She feels much improved, hasn't been feeling abn beats lately. She is aware to send Karida strips via mychart if needed for review. She will keep follow up in a couple weeks to further discuss treatment plan. She appreciates the follow up call.

## 2023-01-02 DIAGNOSIS — G720 Drug-induced myopathy: Secondary | ICD-10-CM | POA: Diagnosis not present

## 2023-01-02 DIAGNOSIS — I10 Essential (primary) hypertension: Secondary | ICD-10-CM | POA: Diagnosis not present

## 2023-01-02 DIAGNOSIS — K760 Fatty (change of) liver, not elsewhere classified: Secondary | ICD-10-CM | POA: Diagnosis not present

## 2023-01-02 DIAGNOSIS — E1169 Type 2 diabetes mellitus with other specified complication: Secondary | ICD-10-CM | POA: Diagnosis not present

## 2023-01-02 DIAGNOSIS — E785 Hyperlipidemia, unspecified: Secondary | ICD-10-CM | POA: Diagnosis not present

## 2023-01-20 NOTE — Progress Notes (Unsigned)
Cardiology Office Note Date:  01/20/2023  Patient ID:  Amanda Frost, DOB 04-Jul-1950, MRN [id number] PCP:  Rodrigo Ran, MD  Cardiologist:  Dr. Royann Shivers Electrophysiologist: Dr. Elberta Fortis    Chief Complaint:  *** 6 mo  History of Present Illness: Amanda Frost is a 72 y.o. 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  Reported some other palpitations, hard beats, planned for monitoring   Patient had a min HR of 50 bpm, max HR of 169 bpm, and avg HR of 67 bpm.  Predominant underlying rhythm was Sinus Rhythm.  13 Supraventricular Tachycardia runs occurred, all less than 13 beats Less than 1% ventricular and supraventricular ectopy Patient triggered episodes associated with VT, sinus rhythm and sinus rhythm with PVCs  *** I didnt see any VT Brief PATs *** symptoms, burden *** kardia tracings *** eliquis, bleeding, dose, labs   AFib/AAD hx Diagnosed 2016 Flecainide was given it seems once in an ER (?) remotely Had cardioversion several hours afterwards 2021 ATach also discovered 2021, patient referred to EP, did not want to start flecainide with concerns of side effects 07/12/21: PVI ablation  Past Medical History:  Diagnosis Date   Hypertension    Liver cirrhosis secondary to NASH (nonalcoholic steatohepatitis) (HCC)    Ocular migraine    Palpitations    PAC's,PVC's,atrial tachycardia   Paroxysmal atrial fibrillation (HCC)     Past Surgical History:  Procedure Laterality Date   ATRIAL FIBRILLATION ABLATION N/A 07/12/2021   Procedure: ATRIAL FIBRILLATION ABLATION;  Surgeon: Regan Lemming, MD;  Location: [facility];   Service: Cardiovascular;  Laterality: N/A;   BREAST BIOPSY  1994   CARDIAC CATHETERIZATION  11/05/2007   normal LV fx,patent coronary arteries   NM MYOVIEW LTD  03/12/2005   normal   US ECHOCARDIOGRAPHY  11/26/2007   mild LVH,trace MR,TR    Current Outpatient Medications  Medication Sig Dispense Refill   adapalene (DIFFERIN) 0.1 % cream Apply 1 application  topically daily as needed (acne).     apixaban (ELIQUIS) 5 MG TABS tablet Take 1 tablet (5 mg total) by mouth 2 (two) times daily. 60 tablet 11   Cholecalciferol (VITAMIN D3) 5000 units CAPS Take 10,000 Units by mouth every Monday, Tuesday, Wednesday, Thursday, and Friday.     diltiazem (CARDIZEM) 30 MG tablet Take 1 tablet (30 mg total) by mouth every 6 (six) hours as needed (PALPITATIONS HEARTRATE HIGHER THAN 120 BPM). 90 tablet 0   metFORMIN (GLUCOPHAGE-XR) 500 MG 24 hr tablet Take 500 mg by mouth daily with breakfast.     metoprolol succinate (TOPROL-XL) 100 MG 24 hr tablet Take 1 tablet (100 mg total) by mouth 2 (two) times daily. 60 tablet 6   metoprolol tartrate (LOPRESSOR) 25 MG tablet TAKE ONE TABLET DAILY AS NEEDED FOR PALPITATIONS 90 tablet 1   olmesartan (BENICAR) 5 MG tablet Take 5 mg by mouth at bedtime.     No current facility-administered medications for this visit.    Allergies:   Ciprofloxacin, Macrodantin [nitrofurantoin macrocrystal], Sulfa antibiotics, Atorvastatin, and Epinephrine   Social History:  The patient  reports that she has never smoked. She has been exposed to tobacco  smoke. She has never used smokeless tobacco. She reports that she does not drink alcohol and does not use drugs.   Family History:  The patient's family history includes Diabetes in her father and mother; Hypertension in her father and mother; Kidney disease in her mother; Migraines in her maternal grandmother, son, and son.  ROS:  Please see the history of present illness.    All other systems are reviewed and otherwise negative.    PHYSICAL EXAM:  VS:  There were no vitals taken for this visit. BMI: There is no height or weight on file to calculate BMI. Well nourished, well developed, in no acute distress HEENT: normocephalic, atraumatic Neck: no JVD, carotid bruits or masses Cardiac:  ***RRR; no significant murmurs, no rubs, or gallops Lungs: *** CTA b/l, no wheezing, rhonchi or rales Abd: soft, nontender MS: no deformity or  atrophy Ext: *** no edema Skin: warm and dry, no rash Neuro:  No gross deficits appreciated Psych: euthymic mood, full affect    EKG:  ER EKGs are reviewed ***   07/12/21: EPS/ablation CONCLUSIONS: 1. Sinus rhythm upon presentation.   2. Successful electrical isolation and anatomical encircling of all four pulmonary veins with radiofrequency current. 3. No inducible arrhythmias following ablation both on and off of dobutamine 4. No early apparent complications.  07/14/21: cardiac CT IMPRESSION: 1. There is normal pulmonary vein drainage into the left atrium. 2. The left atrial appendage is large chicken wing / broccoli type with a single lobe and ostial size 20 X 22 mm and length 48 mm. There is no thrombus in the left atrial appendage. 3. The esophagus runs in the left atrial midline and is not in the proximity to any of the pulmonary veins. 4. Calcium score: Coronary calcium score of 0. This was 0 percentile for age-, race-, and sex-matched controls. Left dominant coronary circulation. 5. 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 No dissection. Atherosclerosis.  01/10/2014; TTE LVEF 55-65%, no WMA  Recent Labs: 10/01/2022: ALT 60; B Natriuretic Peptide 127.2; BUN <5; Creatinine, Ser 0.82; Hemoglobin 13.0; Platelets 244; Potassium 3.7; Sodium 140  No results found for requested labs within last 365 days.   CrCl cannot be calculated (Patient's most recent lab result is older than the maximum 21 days allowed.).   Wt Readings from Last 3 Encounters:   11/23/22 172 lb 12.8 oz (78.4 kg)  10/01/22 168 lb (76.2 kg)  07/23/22 173 lb (78.5 kg)     Other studies reviewed: Additional studies/records reviewed today include: summarized above  ASSESSMENT AND PLAN:  Paroxysmal Afib CHA2DS2Vasc is 3, on Eliquis, *** appropriately dosed *** ***   HTN *** No changes   Disposition: ***  Current medicines are reviewed at length with the patient today.  The patient did not have any concerns regarding medicines.  Norma Fredrickson, PA-C 01/20/2023 9:24 AM     [facility] [address] [place] [address] [phone] (office)  [phone] (fax)

## 2023-01-21 ENCOUNTER — Ambulatory Visit: Payer: PPO | Attending: Physician Assistant | Admitting: Physician Assistant

## 2023-01-21 ENCOUNTER — Encounter: Payer: Self-pay | Admitting: Physician Assistant

## 2023-01-21 VITALS — BP 114/68 | HR 84 | Ht 68.0 in | Wt 169.4 lb

## 2023-01-21 DIAGNOSIS — I10 Essential (primary) hypertension: Secondary | ICD-10-CM | POA: Diagnosis not present

## 2023-01-21 DIAGNOSIS — D6869 Other thrombophilia: Secondary | ICD-10-CM | POA: Diagnosis not present

## 2023-01-21 DIAGNOSIS — I480 Paroxysmal atrial fibrillation: Secondary | ICD-10-CM | POA: Diagnosis not present

## 2023-01-21 DIAGNOSIS — I493 Ventricular premature depolarization: Secondary | ICD-10-CM | POA: Diagnosis not present

## 2023-01-21 MED ORDER — METOPROLOL TARTRATE 25 MG PO TABS: ORAL_TABLET | ORAL | 2 refills | Status: DC

## 2023-01-21 NOTE — Patient Instructions (Signed)
Medication Instructions:   Your physician recommends that you continue on your current medications as directed. Please refer to the Current Medication list given to you today.   *If you need a refill on your cardiac medications before your next appointment, please call your pharmacy*   Lab Work:  None ordered.  If you have labs (blood work) drawn today and your tests are completely normal, you will receive your results only by: MyChart Message (if you have MyChart) OR A paper copy in the mail If you have any lab test that is abnormal or we need to change your treatment, we will call you to review the results.   Testing/Procedures:  None ordered.    Follow-Up: At [facility], you and your health needs are our priority.  As part of our continuing mission to provide you with exceptional heart care, we have created designated Provider Care Teams.  These Care Teams include your primary Cardiologist (physician) and Advanced Practice Providers (APPs -  Physician Assistants and Nurse Practitioners) who all work together to provide you with the care you need, when you need it.  We recommend signing up for the patient portal called "MyChart".  Sign up information is provided on this After Visit Summary.  MyChart is used to connect with patients for Virtual Visits (Telemedicine).  Patients are able to view lab/test results, encounter notes, upcoming appointments, etc.  Non-urgent messages can be sent to your provider as well.   To learn more about what you can do with MyChart, go to [email or web address].    Your next appointment:   4 month(s)  Provider:   Francis Dowse, PA-C

## 2023-01-22 DIAGNOSIS — R059 Cough, unspecified: Secondary | ICD-10-CM | POA: Diagnosis not present

## 2023-01-22 DIAGNOSIS — Z1152 Encounter for screening for COVID-19: Secondary | ICD-10-CM | POA: Diagnosis not present

## 2023-01-22 DIAGNOSIS — E1169 Type 2 diabetes mellitus with other specified complication: Secondary | ICD-10-CM | POA: Diagnosis not present

## 2023-01-22 DIAGNOSIS — J0100 Acute maxillary sinusitis, unspecified: Secondary | ICD-10-CM | POA: Diagnosis not present

## 2023-01-22 DIAGNOSIS — J3489 Other specified disorders of nose and nasal sinuses: Secondary | ICD-10-CM | POA: Diagnosis not present

## 2023-01-22 DIAGNOSIS — R5383 Other fatigue: Secondary | ICD-10-CM | POA: Diagnosis not present

## 2023-01-22 DIAGNOSIS — K219 Gastro-esophageal reflux disease without esophagitis: Secondary | ICD-10-CM | POA: Diagnosis not present

## 2023-01-24 ENCOUNTER — Telehealth: Payer: Self-pay | Admitting: Pulmonary Disease

## 2023-01-24 NOTE — Telephone Encounter (Signed)
PT needs to resched PFT and FU appt w/Dr. Isaiah Serge. No appts avail until Jan. PT states Dr. Did not want to wait that long.Would like a nurse to call. Hr # is [phone]

## 2023-01-24 NOTE — Telephone Encounter (Signed)
Lm x1 for patient.  

## 2023-01-28 NOTE — Telephone Encounter (Signed)
Pt is calling back to inform us Dr. Isaiah Serge seen her husband last week and she asked if it is okay to wait until Jan. Pt states that is fine to wait til Jan.

## 2023-01-29 DIAGNOSIS — L299 Pruritus, unspecified: Secondary | ICD-10-CM | POA: Diagnosis not present

## 2023-01-29 DIAGNOSIS — B3731 Acute candidiasis of vulva and vagina: Secondary | ICD-10-CM | POA: Diagnosis not present

## 2023-01-29 DIAGNOSIS — E1169 Type 2 diabetes mellitus with other specified complication: Secondary | ICD-10-CM | POA: Diagnosis not present

## 2023-01-29 DIAGNOSIS — K5900 Constipation, unspecified: Secondary | ICD-10-CM | POA: Diagnosis not present

## 2023-02-05 ENCOUNTER — Telehealth: Payer: Self-pay | Admitting: Cardiology

## 2023-02-05 NOTE — Telephone Encounter (Signed)
Pt c/o medication issue:  1. Name of Medication:   metoprolol tartrate (LOPRESSOR) 25 MG tablet  metoprolol succinate (TOPROL-XL) 100 MG 24 hr tablet   2. How are you currently taking this medication (dosage and times per day)?     3. Are you having a reaction (difficulty breathing--STAT)?   4. What is your medication issue?   Patient stated she takes both medications but recently started metoprolol tartrate and wants further instructions on how to take this medication.  Patient stated she will be available after 9:30 am.

## 2023-02-05 NOTE — Telephone Encounter (Signed)
Patient has lopressor and diltiazem on her med list to take as needed for palpitations.  Reviewed with Francis Dowse, PA who recommends patient take prn diltiazem unless there is some reason she cannot take it. I spoke with patient. She reports she is having more frequent palpitations recently.  She is taking metoprolol succinate as ordered but has never taken prn lopressor or diltiazem.  I gave patient message from [place].  Patient agreeable to trying prn diltiazem.  She is aware not to take prn lopressor.

## 2023-02-13 ENCOUNTER — Telehealth: Payer: PPO | Admitting: Pulmonary Disease

## 2023-02-25 ENCOUNTER — Other Ambulatory Visit: Payer: Self-pay

## 2023-02-25 ENCOUNTER — Emergency Department (HOSPITAL_BASED_OUTPATIENT_CLINIC_OR_DEPARTMENT_OTHER)
Admission: EM | Admit: 2023-02-25 | Discharge: 2023-02-25 | Disposition: A | Discharge status: Home / Self Care | Payer: PPO | Attending: Emergency Medicine | Admitting: Emergency Medicine

## 2023-02-25 ENCOUNTER — Emergency Department (HOSPITAL_BASED_OUTPATIENT_CLINIC_OR_DEPARTMENT_OTHER): Payer: PPO

## 2023-02-25 ENCOUNTER — Encounter (HOSPITAL_BASED_OUTPATIENT_CLINIC_OR_DEPARTMENT_OTHER): Payer: Self-pay | Admitting: *Deleted

## 2023-02-25 DIAGNOSIS — R1084 Generalized abdominal pain: Secondary | ICD-10-CM

## 2023-02-25 DIAGNOSIS — R001 Bradycardia, unspecified: Secondary | ICD-10-CM | POA: Insufficient documentation

## 2023-02-25 DIAGNOSIS — R55 Syncope and collapse: Secondary | ICD-10-CM | POA: Diagnosis not present

## 2023-02-25 DIAGNOSIS — R109 Unspecified abdominal pain: Secondary | ICD-10-CM | POA: Diagnosis present

## 2023-02-25 DIAGNOSIS — N132 Hydronephrosis with renal and ureteral calculous obstruction: Secondary | ICD-10-CM | POA: Diagnosis not present

## 2023-02-25 DIAGNOSIS — K5900 Constipation, unspecified: Secondary | ICD-10-CM | POA: Diagnosis not present

## 2023-02-25 DIAGNOSIS — K529 Noninfective gastroenteritis and colitis, unspecified: Secondary | ICD-10-CM | POA: Diagnosis not present

## 2023-02-25 LAB — COMPREHENSIVE METABOLIC PANEL WITH GFR
ALT: 31 U/L (ref 0–44)
AST: 32 U/L (ref 15–41)
Albumin: 4.4 g/dL (ref 3.5–5.0)
Alkaline Phosphatase: 95 U/L (ref 38–126)
Anion gap: 9 (ref 5–15)
BUN: 9 mg/dL (ref 8–23)
CO2: 29 mmol/L (ref 22–32)
Calcium: 10.9 mg/dL — ABNORMAL HIGH (ref 8.9–10.3)
Chloride: 102 mmol/L (ref 98–111)
Creatinine, Ser: 0.81 mg/dL (ref 0.44–1.00)
GFR, Estimated: >60 mL/min
Glucose, Bld: 168 mg/dL — ABNORMAL HIGH (ref 70–99)
Potassium: 3.6 mmol/L (ref 3.5–5.1)
Sodium: 140 mmol/L (ref 135–145)
Total Bilirubin: 1 mg/dL
Total Protein: 7.4 g/dL (ref 6.5–8.1)

## 2023-02-25 LAB — BASIC METABOLIC PANEL
Anion gap: 8 (ref 5–15)
BUN: 10 mg/dL (ref 8–23)
CO2: 28 mmol/L (ref 22–32)
Calcium: 10.1 mg/dL (ref 8.9–10.3)
Chloride: 102 mmol/L (ref 98–111)
Creatinine, Ser: 0.78 mg/dL (ref 0.44–1.00)
GFR, Estimated: >60 mL/min (ref >60)
Glucose, Bld: 154 mg/dL — ABNORMAL HIGH (ref 70–99)
Potassium: 4 mmol/L (ref 3.5–5.1)
Sodium: 138 mmol/L (ref 135–145)

## 2023-02-25 LAB — URINALYSIS, ROUTINE W REFLEX MICROSCOPIC
Bilirubin Urine: NEGATIVE
Glucose, UA: NEGATIVE mg/dL
Hgb urine dipstick: NEGATIVE
Ketones, ur: NEGATIVE mg/dL
Leukocytes,Ua: NEGATIVE
Nitrite: NEGATIVE
Protein, ur: NEGATIVE mg/dL
Specific Gravity, Urine: >1.046 — ABNORMAL HIGH (ref 1.005–1.030)
pH: 5.5 (ref 5.0–8.0)

## 2023-02-25 LAB — CBC
HCT: 41 % (ref 36.0–46.0)
Hemoglobin: 13.5 g/dL (ref 12.0–15.0)
MCH: 30.8 pg (ref 26.0–34.0)
MCHC: 32.9 g/dL (ref 30.0–36.0)
MCV: 93.6 fL (ref 80.0–100.0)
Platelets: 269 K/uL (ref 150–400)
RBC: 4.38 MIL/uL (ref 3.87–5.11)
RDW: 13.3 % (ref 11.5–15.5)
WBC: 7.8 K/uL (ref 4.0–10.5)
nRBC: 0 % (ref 0.0–0.2)

## 2023-02-25 LAB — LIPASE, BLOOD: Lipase: 52 U/L — ABNORMAL HIGH (ref 11–51)

## 2023-02-25 LAB — TROPONIN I (HIGH SENSITIVITY)
Troponin I (High Sensitivity): 3 ng/L
Troponin I (High Sensitivity): 4 ng/L (ref <18)

## 2023-02-25 LAB — OCCULT BLOOD X 1 CARD TO LAB, STOOL: Fecal Occult Bld: NEGATIVE

## 2023-02-25 MED ORDER — ONDANSETRON 4 MG PO TBDP: 4.0000 mg | ORAL_TABLET | Freq: Three times a day (TID) | ORAL | 0 refills | Status: AC | PRN

## 2023-02-25 MED ORDER — FENTANYL CITRATE PF 50 MCG/ML IJ SOSY
50.0000 ug | PREFILLED_SYRINGE | Freq: Once | INTRAMUSCULAR | Status: AC
  Administered 2023-02-25: 50 ug via INTRAVENOUS
  Filled 2023-02-25: qty 1

## 2023-02-25 MED ORDER — ONDANSETRON HCL 4 MG/2ML IJ SOLN
4.0000 mg | Freq: Once | INTRAMUSCULAR | Status: AC
  Administered 2023-02-25: 4 mg via INTRAVENOUS
  Filled 2023-02-25: qty 2

## 2023-02-25 MED ORDER — IOHEXOL 300 MG/ML  SOLN
100.0000 mL | Freq: Once | INTRAMUSCULAR | Status: AC | PRN
  Administered 2023-02-25: 100 mL via INTRAVENOUS

## 2023-02-25 MED ORDER — DICYCLOMINE HCL 10 MG/ML IM SOLN
20.0000 mg | Freq: Once | INTRAMUSCULAR | Status: AC
  Administered 2023-02-25: 20 mg via INTRAMUSCULAR
  Filled 2023-02-25: qty 2

## 2023-02-25 MED ORDER — LIDOCAINE VISCOUS HCL 2 % MT SOLN
15.0000 mL | Freq: Once | OROMUCOSAL | Status: AC
  Administered 2023-02-25: 15 mL via OROMUCOSAL
  Filled 2023-02-25: qty 15

## 2023-02-25 MED ORDER — FENTANYL CITRATE PF 50 MCG/ML IJ SOSY
50.0000 ug | PREFILLED_SYRINGE | Freq: Once | INTRAMUSCULAR | Status: DC
  Filled 2023-02-25: qty 1

## 2023-02-25 MED ORDER — DICYCLOMINE HCL 20 MG PO TABS: 20.0000 mg | ORAL_TABLET | Freq: Three times a day (TID) | ORAL | 0 refills | Status: AC | PRN

## 2023-02-25 MED ORDER — ALUM & MAG HYDROXIDE-SIMETH 200-200-20 MG/5ML PO SUSP
30.0000 mL | Freq: Once | ORAL | Status: AC
  Administered 2023-02-25: 30 mL via ORAL
  Filled 2023-02-25: qty 30

## 2023-02-25 MED ORDER — LACTATED RINGERS IV BOLUS
1000.0000 mL | Freq: Once | INTRAVENOUS | Status: AC
  Administered 2023-02-25: 1000 mL via INTRAVENOUS

## 2023-02-25 NOTE — ED Provider Notes (Signed)
[place] EMERGENCY DEPARTMENT AT [facility] Provider Note   CSN: [id number] Arrival date & time: 02/25/23  0208     History  Chief Complaint  Patient presents with   Abdominal Pain    Amanda Frost is a 72 y.o. female.  Patient reports issues with constipation since being on Ozempic about a month ago.  Last dose of Ozempic was 2 weeks ago and she has since stopped this medication.  Comes in tonight with diffuse lower crampy abdominal pain as her 2 hours ago it is very sharp and feels like "labor".  The pain is sharp last for few minutes at a time and resolves before returning again.  Did have a bowel movement this evening and 1 this morning but otherwise has not gone for about 3 days.  Nausea and dry heaving but no true vomiting.  No pain with urination or blood in the urine.  No fever.  No vaginal bleeding or discharge.  No previous abdominal surgeries.  Reports has had this kind of pain in the past that resolved after normal bowel movement and has never been to the hospital for this pain before.  Has had a reassuring colonoscopy and EGD previously.  Also complains of intermittent "heartburn" that has been constant for the past month as well, coming and going lasting for few seconds at a time.  Does take omeprazole.  No heartburn currently.   Notably her grandchild was seen in the ED today also with abdominal pain but patient does not think this is related as her grandchild has chronic abdominal pain issues.  No suspicious ingestions.  Denies chest pain or shortness of breath.  Denies fever or cough.  History of atrial fibrillation on Eliquis and metoprolol for  The history is provided by the patient and the spouse.  Abdominal Pain Associated symptoms: constipation, nausea and vomiting   Associated symptoms: no chest pain, no cough, no dysuria, no hematuria and no shortness of breath        Home Medications Prior to Admission medications   Medication Sig Start  Date End Date Taking? Authorizing Provider  apixaban (ELIQUIS) 5 MG TABS tablet Take 1 tablet (5 mg total) by mouth 2 (two) times daily. 05/10/22  Yes Swinyer, Zachary George, NP  metoprolol succinate (TOPROL-XL) 100 MG 24 hr tablet Take 1 tablet (100 mg total) by mouth 2 (two) times daily. 12/04/22  Yes Eustace Pen, PA-C  adapalene (DIFFERIN) 0.1 % cream Apply 1 application  topically daily as needed (acne).    [provider]  Cholecalciferol (VITAMIN D3) 5000 units CAPS Take 10,000 Units by mouth every Monday, Tuesday, Wednesday, Thursday, and Friday.    [provider]  diltiazem (CARDIZEM) 30 MG tablet Take 1 tablet (30 mg total) by mouth every 6 (six) hours as needed (PALPITATIONS HEARTRATE HIGHER THAN 120 BPM). Patient not taking: Reported on 01/21/2023 12/16/22   Freddy Finner, MD  olmesartan (BENICAR) 5 MG tablet Take 5 mg by mouth at bedtime.    [provider]  Semaglutide (OZEMPIC, 0.25 OR 0.5 MG/DOSE, [place]) Inject 0.5 mg into the skin once a week.    [provider]      Allergies    Ciprofloxacin, Macrodantin [nitrofurantoin macrocrystal], Sulfa antibiotics, Atorvastatin, and Epinephrine    Review of Systems   Review of Systems  Constitutional:  Negative for activity change and appetite change.  HENT:  Negative for congestion and rhinorrhea.   Respiratory:  Negative for cough, chest tightness  and shortness of breath.   Cardiovascular:  Negative for chest pain.  Gastrointestinal:  Positive for abdominal pain, constipation, nausea and vomiting.  Genitourinary:  Negative for dysuria and hematuria.  Musculoskeletal:  Negative for arthralgias and myalgias.  Skin:  Negative for rash.  Neurological:  Negative for dizziness, weakness and headaches.   all other systems are negative except as noted in the HPI and PMH.    Physical Exam Updated Vital Signs BP (!) 152/88   Pulse 90   Temp 98.9 F (37.2 C) (Oral)   Resp 18   SpO2 94%  Physical  Exam Vitals and nursing note reviewed.  Constitutional:      General: She is not in acute distress.    Appearance: She is well-developed.  HENT:     Head: Normocephalic and atraumatic.     Mouth/Throat:     Pharynx: No oropharyngeal exudate.  Eyes:     Conjunctiva/sclera: Conjunctivae normal.     Pupils: Pupils are equal, round, and reactive to light.  Neck:     Comments: No meningismus. Cardiovascular:     Rate and Rhythm: Normal rate and regular rhythm.     Heart sounds: Normal heart sounds. No murmur heard. Pulmonary:     Effort: Pulmonary effort is normal. No respiratory distress.     Breath sounds: Normal breath sounds.  Abdominal:     Palpations: Abdomen is soft.     Tenderness: There is abdominal tenderness. There is no guarding or rebound.     Comments: Diffuse lower abdominal tenderness with voluntary guarding.  No rebound.  Genitourinary:    Comments: Chaperone present Actuary 1.  No fecal impaction.  Light brown stool without blood Musculoskeletal:        General: No tenderness. Normal range of motion.     Cervical back: Normal range of motion and neck supple.  Skin:    General: Skin is warm.  Neurological:     Mental Status: She is alert and oriented to person, place, and time.     Cranial Nerves: No cranial nerve deficit.     Motor: No abnormal muscle tone.     Coordination: Coordination normal.     Comments:  5/5 strength throughout. CN 2-12 intact.Equal grip strength.   Psychiatric:        Behavior: Behavior normal.     ED Results / Procedures / Treatments   Labs (all labs ordered are listed, but only abnormal results are displayed) Labs Reviewed  LIPASE, BLOOD - Abnormal; Notable for the following components:      Result Value   Lipase 52 (*)    All other components within normal limits  COMPREHENSIVE METABOLIC PANEL - Abnormal; Notable for the following components:   Glucose, Bld 168 (*)    Calcium 10.9 (*)    All other components within normal  limits  URINALYSIS, ROUTINE W REFLEX MICROSCOPIC - Abnormal; Notable for the following components:   Specific Gravity, Urine >1.046 (*)    All other components within normal limits  BASIC METABOLIC PANEL - Abnormal; Notable for the following components:   Glucose, Bld 154 (*)    All other components within normal limits  CBC  OCCULT BLOOD X 1 CARD TO LAB, STOOL  TROPONIN I (HIGH SENSITIVITY)  TROPONIN I (HIGH SENSITIVITY)    EKG EKG Interpretation Date/Time:  Monday February 25 2023 02:30:00 EST Ventricular Rate:  83 PR Interval:  150 QRS Duration:  107 QT Interval:  380 QTC Calculation: 447 R  Axis:   3  Text Interpretation: Sinus rhythm Low voltage, precordial leads Abnormal R-wave progression, early transition No significant change was found Confirmed by Glynn Octave [phone]) on 02/25/2023 3:01:38 AM  Radiology CT ABDOMEN PELVIS W CONTRAST  Result Date: 02/25/2023 CLINICAL DATA:  72 year old female with abdominal pain, constipation. Vomiting. EXAM: CT ABDOMEN AND PELVIS WITH CONTRAST TECHNIQUE: Multidetector CT imaging of the abdomen and pelvis was performed using the standard protocol following bolus administration of intravenous contrast. RADIATION DOSE REDUCTION: This exam was performed according to the departmental dose-optimization program which includes automated exposure control, adjustment of the mA and/or kV according to patient size and/or use of iterative reconstruction technique. CONTRAST:  OMNIPAQUE IOHEXOL 300 MG/ML  SOLN COMPARISON:  CTA Chest, Abdomen, and Pelvis 10/01/2022 and earlier. FINDINGS: Lower chest: Heart size remains normal. Stable lung bases. No pericardial or pleural effusion. Hepatobiliary: Negative liver and gallbladder. Pancreas: Negative. Spleen: Negative. Adrenals/Urinary Tract: Normal adrenal glands. Nonobstructed kidneys with normal initial renal enhancement and delayed phase contrast excretion. Decompressed ureters and bladder. Incidental  pelvic phleboliths. Stomach/Bowel: Intermediate density fluid in the distal sigmoid colon and rectum which are not significantly dilated. Upstream retained stool in the descending colon. Combination of retained stool and fluid in the transverse colon. Redundant transverse colon. Similar combination of stool and fluid in the right colon. Appendix remains normal medial to the cecum on series 2, image 56. Fluid-filled but nondilated distal small bowel. Proximal small bowel is decompressed. The stomach is distended with gas and fluid but the duodenum is decompressed. No discrete bowel inflammation identified. No free air or free fluid. Vascular/Lymphatic: Calcified aortic atherosclerosis. Major arterial structures in the abdomen and pelvis are patent. Portal venous system is patent. No lymphadenopathy identified. Reproductive: Stable since 2022, within normal limits. Other: No pelvis free fluid. Musculoskeletal: No acute osseous abnormality identified. IMPRESSION: 1. No evidence of a mechanical bowel obstruction, although both the stomach and the large bowel are fluid-filled. Intermittent retained stool also in the large bowel. Normal appendix. Favor Enteritis/Diarrhea. No free air or free fluid. 2. No other acute or inflammatory process identified in the abdomen or pelvis. 3.  Aortic Atherosclerosis (ICD10-I70.0). Electronically Signed   By: Odessa Fleming M.D.   On: 02/25/2023 04:11    Procedures Procedures    Medications Ordered in ED Medications  lactated ringers bolus 1,000 mL (has no administration in time range)  fentaNYL (SUBLIMAZE) injection 50 mcg (has no administration in time range)  ondansetron (ZOFRAN) injection 4 mg (has no administration in time range)    ED Course/ Medical Decision Making/ A&P                                 Medical Decision Making Amount and/or Complexity of Data Reviewed Independent Historian: spouse Labs: ordered. Decision-making details documented in ED  Course. Radiology: ordered and independent interpretation performed. Decision-making details documented in ED Course. ECG/medicine tests: ordered and independent interpretation performed. Decision-making details documented in ED Course.  Risk OTC drugs. Prescription drug management.  Crampy abdominal pain with vomiting and constipation.  Stable vital signs.  Abdomen tender without peritoneal signs.  EKG is sinus rhythm.  No acute ST changes.  Low suspicion for ACS.  Patient given IV fluids, pain and nausea medications. Labs are reassuring, no leukocytosis. LFTs are normal.  Troponin negative. Mild hypercalcemia 10.9.  Minimal lipase elevation at 52.  CT scan shows no acute surgical pathology.  No  bowel obstruction.  Does show both liquid and formed stool in the colon.  Liquid in the stomach as well.  Concern for enteritis based on CT scan per radiology.  Patient tolerating p.o.  She has had several bowel movements in the ED and feels improved.  States she is passing gas and now having diarrhea.  Urinalysis is negative.  Troponin is negative x 2 with low suspicion for ACS.  Minimal hypercalcemia has improved.  Suspect likely enteritis causing her symptoms.  Would not give additional medications for constipation at this time given her transition to diarrhea.  Discussed p.o. hydration at home with clear liquid diet, advancing as tolerated.  Follow-up with PCP.  Return to the ED sooner with worsening pain, fever, vomiting, not able to eat or drink or other concerns.        Final Clinical Impression(s) / ED Diagnoses Final diagnoses:  Generalized abdominal pain    Rx / DC Orders ED Discharge Orders     None         Sheneika Walstad, Jeannett Senior, MD 02/25/23 [phone]

## 2023-02-25 NOTE — Discharge Instructions (Signed)
Start with a clear liquid diet and advance slowly as tolerated. Take the nausea medications as prescribed. Followup with your doctor for further evaluation. Return to the ED if you develop new or worsening symptoms.

## 2023-02-25 NOTE — ED Triage Notes (Signed)
Pt reporting issues with constipation (small BM today, otherwise, its been about 4 days)- has been taking Mirilax for the same. States lower abdominal cramping that started about 2 hours ago also having increased heartburn tonight, vomiting.

## 2023-02-27 DIAGNOSIS — U071 COVID-19: Secondary | ICD-10-CM | POA: Diagnosis not present

## 2023-02-27 DIAGNOSIS — J3489 Other specified disorders of nose and nasal sinuses: Secondary | ICD-10-CM | POA: Diagnosis not present

## 2023-02-27 DIAGNOSIS — R5383 Other fatigue: Secondary | ICD-10-CM | POA: Diagnosis not present

## 2023-02-27 DIAGNOSIS — Z1152 Encounter for screening for COVID-19: Secondary | ICD-10-CM | POA: Diagnosis not present

## 2023-02-27 DIAGNOSIS — R051 Acute cough: Secondary | ICD-10-CM | POA: Diagnosis not present

## 2023-03-18 DIAGNOSIS — N390 Urinary tract infection, site not specified: Secondary | ICD-10-CM | POA: Diagnosis not present

## 2023-04-05 ENCOUNTER — Encounter (HOSPITAL_BASED_OUTPATIENT_CLINIC_OR_DEPARTMENT_OTHER): Payer: PPO

## 2023-04-05 ENCOUNTER — Telehealth: Payer: PPO | Admitting: Pulmonary Disease

## 2023-04-16 DIAGNOSIS — Z85828 Personal history of other malignant neoplasm of skin: Secondary | ICD-10-CM | POA: Diagnosis not present

## 2023-04-16 DIAGNOSIS — L821 Other seborrheic keratosis: Secondary | ICD-10-CM | POA: Diagnosis not present

## 2023-04-16 DIAGNOSIS — L570 Actinic keratosis: Secondary | ICD-10-CM | POA: Diagnosis not present

## 2023-04-19 ENCOUNTER — Ambulatory Visit (HOSPITAL_BASED_OUTPATIENT_CLINIC_OR_DEPARTMENT_OTHER): Payer: PPO | Admitting: Pulmonary Disease

## 2023-04-19 DIAGNOSIS — J849 Interstitial pulmonary disease, unspecified: Secondary | ICD-10-CM

## 2023-04-19 LAB — PULMONARY FUNCTION TEST
DL/VA % pred: 102 %
DL/VA: 4.11 ml/min/mmHg/L
DLCO cor % pred: 97 %
DLCO cor: 21.08 ml/min/mmHg
DLCO unc % pred: 98 %
DLCO unc: 21.14 ml/min/mmHg
FEF 25-75 Post: 2.75 L/s
FEF 25-75 Pre: 2.73 L/s
FEF2575-%Change-Post: 0 %
FEF2575-%Pred-Post: 137 %
FEF2575-%Pred-Pre: 136 %
FEV1-%Change-Post: 0 %
FEV1-%Pred-Post: 104 %
FEV1-%Pred-Pre: 105 %
FEV1-Post: 2.65 L
FEV1-Pre: 2.65 L
FEV1FVC-%Change-Post: -1 %
FEV1FVC-%Pred-Pre: 108 %
FEV6-%Change-Post: 0 %
FEV6-%Pred-Post: 102 %
FEV6-%Pred-Pre: 101 %
FEV6-Post: 3.26 L
FEV6-Pre: 3.23 L
FEV6FVC-%Change-Post: 0 %
FEV6FVC-%Pred-Post: 104 %
FEV6FVC-%Pred-Pre: 104 %
FVC-%Change-Post: 1 %
FVC-%Pred-Post: 98 %
FVC-%Pred-Pre: 96 %
FVC-Post: 3.28 L
FVC-Pre: 3.23 L
Post FEV1/FVC ratio: 81 %
Post FEV6/FVC ratio: 99 %
Pre FEV1/FVC ratio: 82 %
Pre FEV6/FVC Ratio: 100 %
RV % pred: 397 %
RV: 9.5 L
TLC % pred: 227 %
TLC: 12.62 L

## 2023-04-19 NOTE — Patient Instructions (Signed)
Full PFT Performed Today  

## 2023-04-19 NOTE — Progress Notes (Signed)
Full PFT Performed Today  

## 2023-04-22 ENCOUNTER — Ambulatory Visit: Payer: PPO | Admitting: Pulmonary Disease

## 2023-04-22 ENCOUNTER — Encounter: Payer: Self-pay | Admitting: Pulmonary Disease

## 2023-04-22 VITALS — BP 142/80 | HR 79 | Ht 67.0 in | Wt 169.0 lb

## 2023-04-22 DIAGNOSIS — J849 Interstitial pulmonary disease, unspecified: Secondary | ICD-10-CM | POA: Diagnosis not present

## 2023-04-22 NOTE — Progress Notes (Unsigned)
NOSHIN DEGRAZIA    [id number]    January 10, 1951  Primary Care Physician:Perini, Loraine Leriche, MD  Referring Physician: Rodrigo Ran, MD [address] [place],  [place] [address]  Chief complaint: Consult for abnormal CT, lung nodules  HPI: 73 y.o. who  has a past medical history of Hypertension, Liver cirrhosis secondary to NASH (nonalcoholic steatohepatitis) (HCC), Ocular migraine, Palpitations, and Paroxysmal atrial fibrillation (HCC).   Referred here for abnormal CT, pulmonary nodule.  Had a lesion CT performed on 01/06/2022 for follow-up of pulmonary nodule showed mild groundglass opacities in the mid to lower lungs and has been referred here for further evaluation Overall she feels well with no dyspnea, cough, chest congestion  Pets: Dogs, cats Occupation: Education officer, environmental.  Still works part-time Exposures: No mold, hot tub, Financial controller.  She has a feather pillow in her living room couch Smoking history: Never smoker Travel history: No significant travel history Relevant family history: No family history of lung disease  Outpatient Encounter Medications as of 04/22/2023  Medication Sig   adapalene (DIFFERIN) 0.1 % cream Apply 1 application  topically daily as needed (acne).   apixaban (ELIQUIS) 5 MG TABS tablet Take 1 tablet (5 mg total) by mouth 2 (two) times daily.   Cholecalciferol (VITAMIN D3) 5000 units CAPS Take 10,000 Units by mouth every Monday, Tuesday, Wednesday, Thursday, and Friday.   metoprolol succinate (TOPROL-XL) 100 MG 24 hr tablet Take 1 tablet (100 mg total) by mouth 2 (two) times daily.   olmesartan (BENICAR) 5 MG tablet Take 5 mg by mouth at bedtime.   Semaglutide (OZEMPIC, 0.25 OR 0.5 MG/DOSE, [place]) Inject 0.5 mg into the skin once a week.   dicyclomine (BENTYL) 20 MG tablet Take 1 tablet (20 mg total) by mouth 3 (three) times daily as needed for spasms (abdominal cramps). (Patient not taking: Reported on 04/22/2023)   diltiazem (CARDIZEM) 30 MG tablet Take 1  tablet (30 mg total) by mouth every 6 (six) hours as needed (PALPITATIONS HEARTRATE HIGHER THAN 120 BPM). (Patient not taking: Reported on 04/22/2023)   ondansetron (ZOFRAN-ODT) 4 MG disintegrating tablet Take 1 tablet (4 mg total) by mouth every 8 (eight) hours as needed. (Patient not taking: Reported on 04/22/2023)   No facility-administered encounter medications on file as of 04/22/2023.   Physical Exam: Blood pressure (!) 142/78, pulse 77, temperature 98.4 F (36.9 C), temperature source Oral, height 5\' 8"  (1.727 m), weight 171 lb 6.4 oz (77.7 kg), SpO2 96 %. Gen:      No acute distress HEENT:  EOMI, sclera anicteric Neck:     No masses; no thyromegaly Lungs:    Clear to auscultation bilaterally; normal respiratory effort CV:         Regular rate and rhythm; no murmurs Abd:      + bowel sounds; soft, non-tender; no palpable masses, no distension Ext:    No edema; adequate peripheral perfusion Skin:      Warm and dry; no rash Neuro: alert and oriented x 3 Psych: normal mood and affect  Data Reviewed: Imaging: CT chest 12/27/2021-stable pulmonary nodules compared to 2022 High-res CT 01/06/2022-mild groundglass opacities in the mid to lower lungs, stable pulmonary nodules I have reviewed the images personally.  PFTs:   Labs:  Assessment:  Consult for abnormal CT CT shows mild changes of groundglass which are nonspecific.  There is no clear evidence of interstitial lung disease.  We can monitor this and order high-res CT in 6 months  Pulmonary nodules Stable on follow-up.  Likely benign  Plan/Recommendations: Follow-up high-res CT in 6 months  Chilton Greathouse MD [facility] 04/22/2023, 8:41 AM  CC: Rodrigo Ran, MD

## 2023-04-22 NOTE — Patient Instructions (Signed)
VISIT SUMMARY:  You came in today for a follow-up regarding an abnormal CT scan. The scan showed mild changes that could indicate interstitial lung disease, but your lung function test was normal. We discussed your occasional shortness of breath, particularly when seated to eat.  YOUR PLAN:  -INTERSTITIAL LUNG DISEASE: Interstitial lung disease refers to a group of disorders that cause scarring and inflammation of the lungs. Your CT scan showed mild changes that could suggest this condition. We recommend removing your feather pillow from your daily environment as a precaution. Please monitor your symptoms and report any changes in breathlessness. We will repeat the CT scan and lung function test in one year, or sooner if your symptoms change.  -SHORTNESS OF BREATH: You have occasional episodes of shortness of breath while seated, which are not related to physical activity. This could be related to anxiety. Please monitor your symptoms and report any changes.  INSTRUCTIONS:  Please remove the feather pillow from your daily environment. Monitor your symptoms and report any changes in breathlessness. We will repeat the CT scan and lung function test in one year, or sooner if your symptoms change.

## 2023-04-25 DIAGNOSIS — M25512 Pain in left shoulder: Secondary | ICD-10-CM | POA: Diagnosis not present

## 2023-04-26 ENCOUNTER — Emergency Department (HOSPITAL_COMMUNITY): Payer: PPO

## 2023-04-26 ENCOUNTER — Other Ambulatory Visit: Payer: Self-pay

## 2023-04-26 ENCOUNTER — Encounter (HOSPITAL_COMMUNITY): Payer: Self-pay | Admitting: Emergency Medicine

## 2023-04-26 ENCOUNTER — Emergency Department (HOSPITAL_COMMUNITY)
Admission: EM | Admit: 2023-04-26 | Discharge: 2023-04-26 | Disposition: A | Discharge status: Home / Self Care | Payer: PPO | Attending: Emergency Medicine | Admitting: Emergency Medicine

## 2023-04-26 DIAGNOSIS — R002 Palpitations: Secondary | ICD-10-CM | POA: Diagnosis not present

## 2023-04-26 DIAGNOSIS — R946 Abnormal results of thyroid function studies: Secondary | ICD-10-CM | POA: Diagnosis not present

## 2023-04-26 DIAGNOSIS — Z7901 Long term (current) use of anticoagulants: Secondary | ICD-10-CM | POA: Diagnosis not present

## 2023-04-26 DIAGNOSIS — I10 Essential (primary) hypertension: Secondary | ICD-10-CM | POA: Diagnosis not present

## 2023-04-26 DIAGNOSIS — R7989 Other specified abnormal findings of blood chemistry: Secondary | ICD-10-CM | POA: Diagnosis not present

## 2023-04-26 DIAGNOSIS — Z79899 Other long term (current) drug therapy: Secondary | ICD-10-CM | POA: Insufficient documentation

## 2023-04-26 DIAGNOSIS — I771 Stricture of artery: Secondary | ICD-10-CM | POA: Diagnosis not present

## 2023-04-26 DIAGNOSIS — Z794 Long term (current) use of insulin: Secondary | ICD-10-CM | POA: Diagnosis not present

## 2023-04-26 DIAGNOSIS — I4891 Unspecified atrial fibrillation: Secondary | ICD-10-CM | POA: Diagnosis not present

## 2023-04-26 DIAGNOSIS — R0989 Other specified symptoms and signs involving the circulatory and respiratory systems: Secondary | ICD-10-CM | POA: Diagnosis not present

## 2023-04-26 LAB — CBC WITH DIFFERENTIAL/PLATELET
Abs Immature Granulocytes: 0.01 10*3/uL (ref 0.00–0.07)
Basophils Absolute: 0.1 10*3/uL (ref 0.0–0.1)
Basophils Relative: 1 %
Eosinophils Absolute: 0.2 10*3/uL (ref 0.0–0.5)
Eosinophils Relative: 2 %
HCT: 42 % (ref 36.0–46.0)
Hemoglobin: 13.5 g/dL (ref 12.0–15.0)
Immature Granulocytes: 0 %
Lymphocytes Relative: 43 %
Lymphs Abs: 3.6 10*3/uL (ref 0.7–4.0)
MCH: 30.5 pg (ref 26.0–34.0)
MCHC: 32.1 g/dL (ref 30.0–36.0)
MCV: 94.8 fL (ref 80.0–100.0)
Monocytes Absolute: 0.8 10*3/uL (ref 0.1–1.0)
Monocytes Relative: 9 %
Neutro Abs: 3.7 10*3/uL (ref 1.7–7.7)
Neutrophils Relative %: 45 %
Platelets: 247 10*3/uL (ref 150–400)
RBC: 4.43 MIL/uL (ref 3.87–5.11)
RDW: 13.7 % (ref 11.5–15.5)
WBC: 8.4 10*3/uL (ref 4.0–10.5)
nRBC: 0 % (ref 0.0–0.2)

## 2023-04-26 LAB — TROPONIN I (HIGH SENSITIVITY)
Troponin I (High Sensitivity): 6 ng/L (ref <18)
Troponin I (High Sensitivity): 6 ng/L (ref <18)

## 2023-04-26 LAB — BASIC METABOLIC PANEL
Anion gap: 9 (ref 5–15)
BUN: 7 mg/dL — ABNORMAL LOW (ref 8–23)
CO2: 27 mmol/L (ref 22–32)
Calcium: 9.8 mg/dL (ref 8.9–10.3)
Chloride: 103 mmol/L (ref 98–111)
Creatinine, Ser: 0.77 mg/dL (ref 0.44–1.00)
GFR, Estimated: >60 mL/min (ref >60)
Glucose, Bld: 131 mg/dL — ABNORMAL HIGH (ref 70–99)
Potassium: 3.7 mmol/L (ref 3.5–5.1)
Sodium: 139 mmol/L (ref 135–145)

## 2023-04-26 LAB — T4, FREE: Free T4: 0.69 ng/dL (ref 0.61–1.12)

## 2023-04-26 LAB — TSH: TSH: 6.337 u[IU]/mL — ABNORMAL HIGH (ref 0.350–4.500)

## 2023-04-26 MED ORDER — DILTIAZEM HCL 30 MG PO TABS: 30.0000 mg | ORAL_TABLET | Freq: Four times a day (QID) | ORAL | 0 refills | Status: AC | PRN

## 2023-04-26 NOTE — Discharge Instructions (Addendum)
Please follow up with your cardiologist. Be sure to get plenty of rest over the next few days. I have re-prescribed your diltiazem that you can take as needed if you have rapid heart rate/palpitations with a rate over 120  Your thyroid hormone was mildly elevated, please follow up with your PCP regarding this.   It was a pleasure caring for you today in the emergency department.  Please return to the emergency department for any worsening or worrisome symptoms.

## 2023-04-26 NOTE — ED Triage Notes (Signed)
  Patient states she woke up about an hour ago and felt like her HR was bouncing around.  States she put on her SPO2 monitor and was getting readings of 60-140 bpm.  Denies any N/V, dizziness.  Hx of afib and had ablation 2 years ago.  Denies pain at this time.

## 2023-04-26 NOTE — ED Provider Notes (Signed)
[place] EMERGENCY DEPARTMENT AT [facility] Provider Note  CSN: [id number] Arrival date & time: 04/26/23 0416  Chief Complaint(s) Palpitations  HPI Amanda Frost is a 73 y.o. female with past medical history as below, significant for A-fib on DOAC and as needed Dilt, ocular migraine, palpitations, NASH who presents to the ED with complaint of palpitations, irregular heartbeat  Patient reports that she woke up in the middle of the night, she began having palpitations, felt like her heart was racing.  Chest tightness, Felt like was going to pass out.  When she went to bed last night she felt normal.  She has been compliant with her DOAC.  On my evaluation she is feeling back to normal  Past Medical History Past Medical History:  Diagnosis Date   Hypertension    Liver cirrhosis secondary to NASH (nonalcoholic steatohepatitis) (HCC)    Ocular migraine    Palpitations    PAC's,PVC's,atrial tachycardia   Paroxysmal atrial fibrillation [facility])    Patient Active Problem List   Diagnosis Date Noted   Secondary hypercoagulable state (HCC) 09/04/2021   Migraine aura without headache 10/19/2020   Cervico-occipital neuralgia of right side 11/18/2018   Palpitations 10/15/2017   Chronic anticoagulation 12/05/2016   Paroxysmal atrial fibrillation (HCC) 01/21/2014   Liver cirrhosis secondary to NASH (HCC) 01/21/2014   Essential hypertension 10/29/2012   Paroxysmal atrial tachycardia (HCC) 10/29/2012   Home Medication(s) Prior to Admission medications   Medication Sig Start Date End Date Taking? Authorizing Provider  diltiazem (CARDIZEM) 30 MG tablet Take 1 tablet (30 mg total) by mouth every 6 (six) hours as needed for up to 10 doses (PALPITATIONS, heart rate higher than 120 BPM). 04/26/23  Yes Tanda Rockers A, DO  adapalene (DIFFERIN) 0.1 % cream Apply 1 application  topically daily as needed (acne).    [provider]  apixaban (ELIQUIS) 5 MG TABS tablet Take 1 tablet  (5 mg total) by mouth 2 (two) times daily. 05/10/22   Swinyer, Zachary George, NP  Cholecalciferol (VITAMIN D3) 5000 units CAPS Take 10,000 Units by mouth every Monday, Tuesday, Wednesday, Thursday, and Friday.    [provider]  dicyclomine (BENTYL) 20 MG tablet Take 1 tablet (20 mg total) by mouth 3 (three) times daily as needed for spasms (abdominal cramps). Patient not taking: Reported on 04/22/2023 02/25/23   Glynn Octave, MD  metoprolol succinate (TOPROL-XL) 100 MG 24 hr tablet Take 1 tablet (100 mg total) by mouth 2 (two) times daily. 12/04/22   Eustace Pen, PA-C  olmesartan (BENICAR) 5 MG tablet Take 5 mg by mouth at bedtime.    [provider]  ondansetron (ZOFRAN-ODT) 4 MG disintegrating tablet Take 1 tablet (4 mg total) by mouth every 8 (eight) hours as needed. Patient not taking: Reported on 04/22/2023 02/25/23   Rancour, Jeannett Senior, MD  Semaglutide (OZEMPIC, 0.25 OR 0.5 MG/DOSE, [place]) Inject 0.5 mg into the skin once a week.    [provider]  Past Surgical History Past Surgical History:  Procedure Laterality Date   ATRIAL FIBRILLATION ABLATION N/A 07/12/2021   Procedure: ATRIAL FIBRILLATION ABLATION;  Surgeon: Regan Lemming, MD;  Location: [facility] INVASIVE CV LAB;  Service: Cardiovascular;  Laterality: N/A;   BREAST BIOPSY  1994   CARDIAC CATHETERIZATION  11/05/2007   normal LV fx,patent coronary arteries   NM MYOVIEW LTD  03/12/2005   normal   US ECHOCARDIOGRAPHY  11/26/2007   mild LVH,trace MR,TR   Family History Family History  Problem Relation Age of Onset   Diabetes Mother    Hypertension Mother    Kidney disease Mother    Diabetes Father    Hypertension Father    Migraines Maternal Grandmother    Migraines Son        childhood   Migraines Son        childhood    Social History Social History   Tobacco Use    Smoking status: Never    Passive exposure: Past   Smokeless tobacco: Never   Tobacco comments:    Never smoked 11/23/22  Vaping Use   Vaping status: Never Used  Substance Use Topics   Alcohol use: No    Alcohol/week: 0.0 standard drinks of alcohol   Drug use: No   Allergies Ciprofloxacin, Macrodantin [nitrofurantoin macrocrystal], Sulfa antibiotics, Atorvastatin, and Epinephrine  Review of Systems Review of Systems  Constitutional:  Negative for chills and fever.  Respiratory:  Positive for chest tightness and shortness of breath.   Cardiovascular:  Positive for palpitations. Negative for chest pain.  Gastrointestinal:  Negative for abdominal pain.  Neurological:  Positive for light-headedness. Negative for dizziness and syncope.  All other systems reviewed and are negative.   Physical Exam Vital Signs  I have reviewed the triage vital signs BP 134/73   Pulse 69   Temp 98.1 F (36.7 C) (Oral)   Resp 15   Ht 5\' 7"  (1.702 m)   Wt 76.7 kg   SpO2 98%   BMI 26.47 kg/m  Physical Exam Vitals and nursing note reviewed.  Constitutional:      General: She is not in acute distress.    Appearance: Normal appearance.  HENT:     Head: Normocephalic and atraumatic.     Right Ear: External ear normal.     Left Ear: External ear normal.     Nose: Nose normal.     Mouth/Throat:     Mouth: Mucous membranes are moist.  Eyes:     General: No scleral icterus.       Right eye: No discharge.        Left eye: No discharge.  Cardiovascular:     Rate and Rhythm: Normal rate and regular rhythm.     Pulses: Normal pulses.     Heart sounds: Normal heart sounds. No murmur heard. Pulmonary:     Effort: Pulmonary effort is normal. No respiratory distress.     Breath sounds: Normal breath sounds. No stridor.  Abdominal:     General: Abdomen is flat. There is no distension.     Palpations: Abdomen is soft.     Tenderness: There is no abdominal tenderness.  Musculoskeletal:     Cervical  back: No rigidity.     Right lower leg: No edema.     Left lower leg: No edema.  Skin:    General: Skin is warm and dry.     Capillary Refill: Capillary refill takes less than 2 seconds.  Neurological:  Mental Status: She is alert.  Psychiatric:        Mood and Affect: Mood is anxious. Affect is tearful.        Behavior: Behavior normal. Behavior is cooperative.     ED Results and Treatments Labs (all labs ordered are listed, but only abnormal results are displayed) Labs Reviewed  BASIC METABOLIC PANEL - Abnormal; Notable for the following components:      Result Value   Glucose, Bld 131 (*)    BUN 7 (*)    All other components within normal limits  TSH - Abnormal; Notable for the following components:   TSH 6.337 (*)    All other components within normal limits  CBC WITH DIFFERENTIAL/PLATELET  T4, FREE  TROPONIN I (HIGH SENSITIVITY)  TROPONIN I (HIGH SENSITIVITY)                                                                                                                          Radiology DG Chest Port 1 View Result Date: 04/26/2023 CLINICAL DATA:  73 year old female with atrial fibrillation. EXAM: PORTABLE CHEST 1 VIEW COMPARISON:  CT Chest, Abdomen, and Pelvis 10/01/2022 and earlier. FINDINGS: Portable AP semi upright view at 0512 hours. Lower lung volumes. Cardiac size at the upper limits of normal. Stable mild tortuosity of the thoracic aorta. Other mediastinal contours are within normal limits. Visualized tracheal air column is within normal limits. Allowing for portable technique the lungs are clear. No pneumothorax, pulmonary edema, pleural effusion. Negative visible bowel gas. No acute osseous abnormality identified. IMPRESSION: Lower lung volumes, otherwise no acute cardiopulmonary abnormality. Electronically Signed   By: Odessa Fleming M.D.   On: 04/26/2023 05:37    Pertinent labs & imaging results that were available during my care of the patient were reviewed by me  and considered in my medical decision making (see MDM for details).  Medications Ordered in ED Medications - No data to display                                                                                                                                   Procedures Procedures  (including critical care time)  Medical Decision Making / ED Course    Medical Decision Making:    Ezinne Yogi Alfrey is a 73 y.o. female with past medical history as below, significant for A-fib on DOAC and as needed Dilt, ocular migraine,  palpitations, NASH who presents to the ED with complaint of palpitations, irregular heartbeat. The complaint involves an extensive differential diagnosis and also carries with it a high risk of complications and morbidity.  Serious etiology was considered. Ddx includes but is not limited to: Differential includes all life-threatening causes for chest pain. This includes but is not exclusive to acute coronary syndrome, aortic dissection, pulmonary embolism, cardiac tamponade, community-acquired pneumonia, pericarditis, musculoskeletal chest wall pain, etc.   Complete initial physical exam performed, notably the patient was in no acute distress, NSR on telemetry.    Reviewed and confirmed nursing documentation for past medical history, family history, social history.  Vital signs reviewed.      Clinical Course as of 04/26/23 4742  Caleen Essex Apr 26, 2023  [id number] She has converted back to NSR, symptoms resolved [SG]    Clinical Course User Index [SG] Sloan Leiter, DO    Brief summary: 73 year old female history above including paroxysmal A-fib on DOAC, NASH here with palpitations.  Since have since resolved.  She is back in sinus rhythm. On arrival she was in RVR.  She did not take her as needed diltiazem Reports increase stress over the past few days secondary to family issues She follows with Dr. Elberta Fortis She is status post ablation, has not had any episodes of A-fib since  her ablation around 2 years ago No chest pain TSH mild elev, t4 pend; doubt myxedema coma or thyroid storm; advised to f/u outpatient Re-rx dilt PRN, f/u cardiology Symptoms likely 2/2 AFIB RVR    The patient improved significantly and was discharged in stable condition. Detailed discussions were had with the patient/guardian regarding current findings, and need for close f/u with PCP or on call doctor. The patient/guardian has been instructed to return immediately if the symptoms worsen in any way for re-evaluation. Patient/guardian verbalized understanding and is in agreement with current care plan. All questions answered prior to discharge.                  Additional history obtained: -Additional history obtained from family -External records from outside source obtained and reviewed including: Chart review including previous notes, labs, imaging, consultation notes including  Home medications, prior ED visit, primary care documentation   Lab Tests: -I ordered, reviewed, and interpreted labs.   The pertinent results include:   Labs Reviewed  BASIC METABOLIC PANEL - Abnormal; Notable for the following components:      Result Value   Glucose, Bld 131 (*)    BUN 7 (*)    All other components within normal limits  TSH - Abnormal; Notable for the following components:   TSH 6.337 (*)    All other components within normal limits  CBC WITH DIFFERENTIAL/PLATELET  T4, FREE  TROPONIN I (HIGH SENSITIVITY)  TROPONIN I (HIGH SENSITIVITY)    Notable for TSH is elev, t4 pending.   EKG   EKG Interpretation Date/Time:  Friday April 26 2023 06:14:51 EST Ventricular Rate:  72 PR Interval:  185 QRS Duration:  92 QT Interval:  421 QTC Calculation: 461 R Axis:   43  Text Interpretation: Sinus rhythm Abnormal R-wave progression, early transition Confirmed by Tanda Rockers ([id number]) on 04/26/2023 6:26:16 AM         Imaging Studies ordered: I ordered imaging studies  including chest x-ray I independently visualized the following imaging with scope of interpretation limited to determining acute life threatening conditions related to emergency care; findings noted above I independently visualized and interpreted imaging.  I agree with the radiologist interpretation   Medicines ordered and prescription drug management: Meds ordered this encounter  Medications   diltiazem (CARDIZEM) 30 MG tablet    Sig: Take 1 tablet (30 mg total) by mouth every 6 (six) hours as needed for up to 10 doses (PALPITATIONS, heart rate higher than 120 BPM).    Dispense:  10 tablet    Refill:  0    -I have reviewed the patients home medicines and have made adjustments as needed   Consultations Obtained: na   Cardiac Monitoring: The patient was maintained on a cardiac monitor.  I personally viewed and interpreted the cardiac monitored which showed an underlying rhythm of: RVR > NSR Continuous pulse oximetry interpreted by myself, 100% on RA.    Social Determinants of Health:  Diagnosis or treatment significantly limited by social determinants of health: na   Reevaluation: After the interventions noted above, I reevaluated the patient and found that they have resolved  Co morbidities that complicate the patient evaluation  Past Medical History:  Diagnosis Date   Hypertension    Liver cirrhosis secondary to NASH (nonalcoholic steatohepatitis) (HCC)    Ocular migraine    Palpitations    PAC's,PVC's,atrial tachycardia   Paroxysmal atrial fibrillation (HCC)       Dispostion: Disposition decision including need for hospitalization was considered, and patient discharged from emergency department.    Final Clinical Impression(s) / ED Diagnoses Final diagnoses:  Atrial fibrillation with RVR (HCC)  Palpitations  Elevated TSH        Sloan Leiter, DO 04/26/23 [phone]

## 2023-04-29 ENCOUNTER — Telehealth (HOSPITAL_COMMUNITY): Payer: Self-pay

## 2023-04-29 NOTE — Telephone Encounter (Signed)
Patient called about her Diltiazem 30 mg tablet medication. She wanted to know how to take it.  Advised patient to only take this medication if her heart rate is over 100 and top number blood pressure needed to be above 100. She mentioned her thyroid level being high at the ER. Patient was told to reach out to her pcp to have her thyroid rechecked. Consulted with patient and she verbalized understanding.

## 2023-05-01 DIAGNOSIS — I77810 Thoracic aortic ectasia: Secondary | ICD-10-CM | POA: Diagnosis not present

## 2023-05-01 DIAGNOSIS — E785 Hyperlipidemia, unspecified: Secondary | ICD-10-CM | POA: Diagnosis not present

## 2023-05-01 DIAGNOSIS — D6869 Other thrombophilia: Secondary | ICD-10-CM | POA: Diagnosis not present

## 2023-05-01 DIAGNOSIS — I10 Essential (primary) hypertension: Secondary | ICD-10-CM | POA: Diagnosis not present

## 2023-05-01 DIAGNOSIS — R7989 Other specified abnormal findings of blood chemistry: Secondary | ICD-10-CM | POA: Diagnosis not present

## 2023-05-01 DIAGNOSIS — I700 Atherosclerosis of aorta: Secondary | ICD-10-CM | POA: Diagnosis not present

## 2023-05-01 DIAGNOSIS — G720 Drug-induced myopathy: Secondary | ICD-10-CM | POA: Diagnosis not present

## 2023-05-01 DIAGNOSIS — E1169 Type 2 diabetes mellitus with other specified complication: Secondary | ICD-10-CM | POA: Diagnosis not present

## 2023-05-01 DIAGNOSIS — M810 Age-related osteoporosis without current pathological fracture: Secondary | ICD-10-CM | POA: Diagnosis not present

## 2023-05-01 DIAGNOSIS — I480 Paroxysmal atrial fibrillation: Secondary | ICD-10-CM | POA: Diagnosis not present

## 2023-05-04 ENCOUNTER — Other Ambulatory Visit (HOSPITAL_COMMUNITY): Payer: Self-pay | Admitting: Internal Medicine

## 2023-05-05 NOTE — Progress Notes (Unsigned)
Cardiology Office Note Date:  05/05/2023  Patient ID:  Amanda Frost, DOB [birth year], MRN [id number] PCP:  Rodrigo Ran, MD  Cardiologist:  Dr. Royann Shivers Electrophysiologist: Dr. Elberta Fortis    Chief Complaint:   post ER  History of Present Illness: [name] is a 73 y.o. 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  Reported some other palpitations, hard beats, planned for monitoring   Patient had a min HR of 50 bpm, max HR of 169 bpm, and avg HR of 67 bpm.  Predominant underlying rhythm was Sinus Rhythm.  13 Supraventricular Tachycardia runs occurred, all less than 13 beats Less than 1% ventricular and supraventricular ectopy Patient triggered episodes associated with VT, sinus rhythm and sinus rhythm with PVCs  I saw her 01/21/23 She is accompanied by her husband. Generally doing well, the palpitations worry her No CP, SOB No bleeding, sign of bleeding Her palpitations seem to wax and wane, Monday last week seemed all day, other days not as much. They do not feel like her AFib We reviewed her monitor, discussed PVCs, her low burden and benign nature of the findings In my review no VT, but did have a short SVT Advised symptom diary to try and establish any pattern/trigger Discussed use of PRN med  ER visit 02/25/23, belly pain, nausea, retching, suspected enteritis  ER visit 04/26/23 with palpitations, woke her, tightness, racing, near syncope Note arrived in RVR >> SR  Looks like she had spontaneous CV  LABS K+ 3.7 BUN/Creat 7/0.77 HS Trop 6, 6 WBC 84 H/H 13/42 Plts 247 TSH 6.337  TODAY She is accompanied by her grand  daughter Outside of this Afib event she thinks this is the first tru AFib event she has had sine her ablation She feels the isolated skipped/extra beats (PVCs) but this was much different, HRs all over the place, widely fluctuating HRs, fast, symptomatic She did not try the diltiazem, was unclear on when/how to use.  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  Past Medical History:  Diagnosis Date   Hypertension    Liver cirrhosis secondary to NASH (nonalcoholic steatohepatitis) (HCC)    Ocular migraine    Palpitations    PAC's,PVC's,atrial tachycardia   Paroxysmal atrial fibrillation (HCC)     Past Surgical History:  Procedure Laterality Date   ATRIAL FIBRILLATION ABLATION N/A 07/12/2021   Procedure: ATRIAL FIBRILLATION ABLATION;  Surgeon: Regan Lemming, MD;  Location: [facility];  Service: Cardiovascular;  Laterality: N/A;   BREAST BIOPSY  1994   CARDIAC CATHETERIZATION  11/05/2007   normal LV fx,patent coronary arteries   NM MYOVIEW LTD  03/12/2005   normal   US ECHOCARDIOGRAPHY  11/26/2007   mild LVH,trace MR,TR    Current Outpatient Medications  Medication Sig Dispense Refill   adapalene (DIFFERIN)  0.1 % cream Apply 1 application  topically daily as needed (acne).     apixaban (ELIQUIS) 5 MG TABS tablet Take 1 tablet (5 mg total) by mouth 2 (two) times daily. 60 tablet 11   Cholecalciferol (VITAMIN D3) 5000 units CAPS Take 10,000 Units by mouth every Monday, Tuesday, Wednesday, Thursday, and Friday.     dicyclomine (BENTYL) 20 MG tablet Take 1 tablet (20 mg total) by mouth 3  (three) times daily as needed for spasms (abdominal cramps). (Patient not taking: Reported on 04/22/2023) 20 tablet 0   diltiazem (CARDIZEM) 30 MG tablet Take 1 tablet (30 mg total) by mouth every 6 (six) hours as needed for up to 10 doses (PALPITATIONS, heart rate higher than 120 BPM). 10 tablet 0   metoprolol succinate (TOPROL-XL) 100 MG 24 hr tablet Take 1 tablet (100 mg total) by mouth 2 (two) times daily. 60 tablet 6   olmesartan (BENICAR) 5 MG tablet Take 5 mg by mouth at bedtime.     ondansetron (ZOFRAN-ODT) 4 MG disintegrating tablet Take 1 tablet (4 mg total) by mouth every 8 (eight) hours as needed. (Patient not taking: Reported on 04/22/2023) 20 tablet 0   Semaglutide (OZEMPIC, 0.25 OR 0.5 MG/DOSE, [place]) Inject 0.5 mg into the skin once a week.     No current facility-administered medications for this visit.    Allergies:   Ciprofloxacin, Macrodantin [nitrofurantoin macrocrystal], Sulfa antibiotics, Atorvastatin, and Epinephrine   Social History:  The patient  reports that she has never smoked. She has been exposed to tobacco smoke. She has never used smokeless tobacco. She reports that she does not drink alcohol and does not use drugs.   Family History:  The patient's family history includes Diabetes in her father and mother; Hypertension in her father and mother; Kidney disease in her mother; Migraines in her maternal grandmother, son, and son.  ROS:  Please see the history of present illness.    All other systems are reviewed and otherwise negative.   PHYSICAL EXAM:  VS:  There were no vitals taken for this visit. BMI: There is no height or weight on file to calculate BMI. Well nourished, well developed, in no acute distress HEENT: normocephalic, atraumatic Neck: no JVD, carotid bruits or masses Cardiac:  RRR; no extrasystoles, no significant murmurs, no rubs, or gallops Lungs: CTA b/l, no wheezing, rhonchi or rales Abd: soft, nontender MS: no deformity or  atrophy Ext: no  edema Skin: warm and dry, no rash Neuro:  No gross deficits appreciated Psych: euthymic mood, full affect    EKG:  not done today ER EKGs reviewed  07/12/21: EPS/ablation CONCLUSIONS: 1. Sinus rhythm upon presentation.   2. Successful electrical isolation and anatomical encircling of all four pulmonary veins with radiofrequency current. 3. No inducible arrhythmias following ablation both on and off of dobutamine 4. No early apparent complications.  07/14/21: cardiac CT IMPRESSION: 1. There is normal pulmonary vein drainage into the left atrium. 2. The left atrial appendage is large chicken wing / broccoli type with a single lobe and ostial size 20 X 22 mm and length 48 mm. There is no thrombus in the left atrial appendage. 3. The esophagus runs in the left atrial midline and is not in the proximity to any of the pulmonary veins. 4. Calcium score: Coronary calcium score of 0. This was 0 percentile for age-, race-, and sex-matched controls. Left dominant coronary circulation. 5. 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  No dissection. Atherosclerosis.  01/10/2014; TTE LVEF 55-65%, no WMA  Recent Labs: 10/01/2022: B Natriuretic Peptide 127.2 02/25/2023: ALT 31 04/26/2023: BUN 7; Creatinine, Ser 0.77; Hemoglobin 13.5; Platelets 247; Potassium 3.7; Sodium 139; TSH 6.337  No results found for requested labs within last 365 days.   Estimated Creatinine Clearance: 67.8 mL/min (by C-G formula based on SCr of 0.77 mg/dL).   Wt Readings from Last 3 Encounters:  04/26/23 169 lb (76.7 kg)  04/22/23 169 lb (76.7 kg)  04/19/23 168 lb 9.6 oz (76.5 kg)     Other studies reviewed: Additional studies/records reviewed today include: summarized above  ASSESSMENT AND PLAN:  Paroxysmal Afib CHA2DS2Vasc is 3, on Eliquis, appropriately dosed  This is her 1st AFib episode since her ablation We discussed strategies AAD add on, like Flecainide Repeat ablation No  change, monitor burden  She is more inclined to monitor burden which I think is reasonable Discussed how and when to use the PRN diltiazem. If she she thinks she is in Afib by symptoms, she can use ORN dilt, she does not have to wait until her HR sustains >120 Also discussed that if she is having day that her PVCs are more burdensome she can also use PRN dilt to see if that helps settle those Management of AFib episode, AFib clinic (clinic hours), [place], PRN dilt, ER if needed   HTN Repeat by myself is 148/84 She reports better at homa and always egts quite high at MD office  3. PVCs Occ by symptoms  4. Secondary hypercoagulable state   Disposition: back in 4 mo, sooner if needed   Current medicines are reviewed at length with the patient today.  The patient did not have any concerns regarding medicines.  Norma Fredrickson, PA-C 05/05/2023 9:49 AM     [facility] [address] [place] [address] [phone] (office)  [phone] (fax)

## 2023-05-06 ENCOUNTER — Ambulatory Visit: Payer: PPO | Attending: Physician Assistant | Admitting: Physician Assistant

## 2023-05-06 ENCOUNTER — Encounter: Payer: Self-pay | Admitting: Physician Assistant

## 2023-05-06 VITALS — BP 160/100 | HR 72 | Ht 67.0 in | Wt 168.0 lb

## 2023-05-06 DIAGNOSIS — I480 Paroxysmal atrial fibrillation: Secondary | ICD-10-CM

## 2023-05-06 DIAGNOSIS — D6869 Other thrombophilia: Secondary | ICD-10-CM | POA: Diagnosis not present

## 2023-05-06 DIAGNOSIS — I10 Essential (primary) hypertension: Secondary | ICD-10-CM | POA: Diagnosis not present

## 2023-05-06 DIAGNOSIS — I493 Ventricular premature depolarization: Secondary | ICD-10-CM

## 2023-05-06 NOTE — Patient Instructions (Signed)
  Medication Instructions:    Your physician recommends that you continue on your current medications as directed. Please refer to the Current Medication list given to you today.    *If you need a refill on your cardiac medications before your next appointment, please call your pharmacy*    Lab Work:  NONE ORDERED  TODAY    If you have labs (blood work) drawn today and your tests are completely normal, you will receive your results only by: MyChart Message (if you have MyChart) OR A paper copy in the mail If you have any lab test that is abnormal or we need to change your treatment, we will call you to review the results.     Testing/Procedures:  NONE ORDERED  TODAY      Follow-Up: At [facility], you and your health needs are our priority.  As part of our continuing mission to provide you with exceptional heart care, we have created designated Provider Care Teams.  These Care Teams include your primary Cardiologist (physician) and Advanced Practice Providers (APPs -  Physician Assistants and Nurse Practitioners) who all work together to provide you with the care you need, when you need it.  We recommend signing up for the patient portal called "MyChart".  Sign up information is provided on this After Visit Summary.  MyChart is used to connect with patients for Virtual Visits (Telemedicine).  Patients are able to view lab/test results, encounter notes, upcoming appointments, etc.  Non-urgent messages can be sent to your provider as well.   To learn more about what you can do with MyChart, go to [email or web address].      Your next appointment:     4 month(s)     Provider:   Loman Brooklyn, MD or Francis Dowse, PA-C     Other Instructions        1st Floor: - Lobby - Registration  - Pharmacy  - Lab - Cafe  2nd Floor: - PV Lab - Diagnostic Testing (echo, CT, nuclear med)  3rd Floor: - Vacant  4th Floor: - TCTS (cardiothoracic surgery) -  AFib Clinic - Structural Heart Clinic - Vascular Surgery  - Vascular Ultrasound  5th Floor: - HeartCare Cardiology (general and EP) - Clinical Pharmacy for coumadin, hypertension, lipid, weight-loss medications, and med management appointments    Valet parking services will be available as well.

## 2023-05-09 DIAGNOSIS — Z01419 Encounter for gynecological examination (general) (routine) without abnormal findings: Secondary | ICD-10-CM | POA: Diagnosis not present

## 2023-05-09 DIAGNOSIS — Z1231 Encounter for screening mammogram for malignant neoplasm of breast: Secondary | ICD-10-CM | POA: Diagnosis not present

## 2023-05-09 DIAGNOSIS — Z6826 Body mass index (BMI) 26.0-26.9, adult: Secondary | ICD-10-CM | POA: Diagnosis not present

## 2023-05-29 ENCOUNTER — Other Ambulatory Visit: Payer: Self-pay | Admitting: Nurse Practitioner

## 2023-05-30 NOTE — Telephone Encounter (Signed)
 Prescription refill request for Eliquis received. Indication: PAF Last office visit: 05/06/23  Almyra Free PA-C Scr: 0.77 on 04/26/23  Epic Age: 73 Weight: 76.2kg  Based on above findings Eliquis 5mg  twice daily is the appropriate dose.  Refill approved.

## 2023-06-06 ENCOUNTER — Telehealth: Payer: Self-pay | Admitting: *Deleted

## 2023-06-06 DIAGNOSIS — Z7901 Long term (current) use of anticoagulants: Secondary | ICD-10-CM | POA: Diagnosis not present

## 2023-06-06 DIAGNOSIS — Z860101 Personal history of adenomatous and serrated colon polyps: Secondary | ICD-10-CM | POA: Diagnosis not present

## 2023-06-06 DIAGNOSIS — K219 Gastro-esophageal reflux disease without esophagitis: Secondary | ICD-10-CM | POA: Diagnosis not present

## 2023-06-06 DIAGNOSIS — K5900 Constipation, unspecified: Secondary | ICD-10-CM | POA: Diagnosis not present

## 2023-06-06 DIAGNOSIS — I4891 Unspecified atrial fibrillation: Secondary | ICD-10-CM | POA: Diagnosis not present

## 2023-06-06 NOTE — Telephone Encounter (Signed)
   Pre-operative Risk Assessment    Patient Name: Amanda Frost  DOB: [birth year] MRN: [id number]   Date of last office visit: 05/06/2023 Date of next office visit: 09/09/2023  Request for Surgical Clearance    Procedure:   Colonoscopy  Date of Surgery:  Clearance 08/01/23     No day listed.                             Surgeon:  Dr. Bernette Redbird Surgeon's Group or Practice Name:  [facility] Phone number:  [phone] Fax number:  [phone]   Type of Clearance Requested:   - Medical  - Pharmacy:  Hold Apixaban (Eliquis) 2 days prior.    Type of Anesthesia:   propofol   Additional requests/questions:    Signed, Emmit Pomfret   06/06/2023, 11:39 AM

## 2023-06-06 NOTE — Telephone Encounter (Signed)
 Patient with diagnosis of atrial fibrillation on Eliquis for anticoagulation.    Procedure:   Colonoscopy   Date of Surgery:  Clearance 08/01/23      CHA2DS2-VASc Score = 3   This indicates a 3.2% annual risk of stroke. The patient's score is based upon: CHF History: 0 HTN History: 1 Diabetes History: 0 Stroke History: 0 Vascular Disease History: 0 Age Score: 1 Gender Score: 1   CrCl 79 Platelet count 247  Per office protocol, patient can hold Eliquis for 2 days prior to procedure.   Patient will not need bridging with Lovenox (enoxaparin) around procedure.  **This guidance is not considered finalized until pre-operative APP has relayed final recommendations.**

## 2023-06-06 NOTE — Telephone Encounter (Signed)
 Pharmacy please advise on holding Eliquis prior to colonoscopy scheduled for 08/01/2023. Thank you.

## 2023-06-07 ENCOUNTER — Telehealth: Payer: Self-pay | Admitting: *Deleted

## 2023-06-07 NOTE — Telephone Encounter (Signed)
 Pt has been scheduled telepreop appt 07/29/23. Pt tells me her procedure will not be on 08/01/23 as stated by the requesting office. She tells me that it will be sometime mid or late May.   Med rec and consent are done.      Patient Consent for Virtual Visit        Cortina Vultaggio Kinnick has provided verbal consent on 06/07/2023 for a virtual visit (video or telephone).   CONSENT FOR VIRTUAL VISIT FOR:  Myliah T Kindel  By participating in this virtual visit I agree to the following:  I hereby voluntarily request, consent and authorize [facility] and its employed or contracted physicians, physician assistants, nurse practitioners or other licensed health care professionals (the Practitioner), to provide me with telemedicine health care services (the "Services") as deemed necessary by the treating Practitioner. I acknowledge and consent to receive the Services by the Practitioner via telemedicine. I understand that the telemedicine visit will involve communicating with the Practitioner through live audiovisual communication technology and the disclosure of certain medical information by electronic transmission. I acknowledge that I have been given the opportunity to request an in-person assessment or other available alternative prior to the telemedicine visit and am voluntarily participating in the telemedicine visit.  I understand that I have the right to withhold or withdraw my consent to the use of telemedicine in the course of my care at any time, without affecting my right to future care or treatment, and that the Practitioner or I may terminate the telemedicine visit at any time. I understand that I have the right to inspect all information obtained and/or recorded in the course of the telemedicine visit and may receive copies of available information for a reasonable fee.  I understand that some of the potential risks of receiving the Services via telemedicine include:  Delay or  interruption in medical evaluation due to technological equipment failure or disruption; Information transmitted may not be sufficient (e.g. poor resolution of images) to allow for appropriate medical decision making by the Practitioner; and/or  In rare instances, security protocols could fail, causing a breach of personal health information.  Furthermore, I acknowledge that it is my responsibility to provide information about my medical history, conditions and care that is complete and accurate to the best of my ability. I acknowledge that Practitioner's advice, recommendations, and/or decision may be based on factors not within their control, such as incomplete or inaccurate data provided by me or distortions of diagnostic images or specimens that may result from electronic transmissions. I understand that the practice of medicine is not an exact science and that Practitioner makes no warranties or guarantees regarding treatment outcomes. I acknowledge that a copy of this consent can be made available to me via my patient portal [facility] MyChart), or I can request a printed copy by calling the office of [facility].    I understand that my insurance will be billed for this visit.   I have read or had this consent read to me. I understand the contents of this consent, which adequately explains the benefits and risks of the Services being provided via telemedicine.  I have been provided ample opportunity to ask questions regarding this consent and the Services and have had my questions answered to my satisfaction. I give my informed consent for the services to be provided through the use of telemedicine in my medical care

## 2023-06-07 NOTE — Telephone Encounter (Signed)
 Pt has been scheduled telepreop appt 07/29/23. Pt tells me her procedure will not be on 08/01/23 as stated by the requesting office. She tells me that it will be sometime mid or late May.    Med rec and consent are done.

## 2023-06-07 NOTE — Telephone Encounter (Signed)
   Name: Amanda Frost  DOB: [birth year]  MRN: [id number]  Primary Cardiologist: Thurmon Fair, MD   Preoperative team, please contact this patient and set up a phone call appointment closer to clearance date of 08/01/2023 for further preoperative risk assessment. Please obtain consent and complete medication review. Thank you for your help.  I confirm that guidance regarding antiplatelet and oral anticoagulation therapy has been completed and, if necessary, noted below.  Per office protocol, patient can hold Eliquis for 2 days prior to procedure.   Patient will not need bridging with Lovenox (enoxaparin) around procedure.  I also confirmed the patient resides in the state of [place]. As per [facility] Medical Board telemedicine laws, the patient must reside in the state in which the provider is licensed.   Denyce Robert, NP 06/07/2023, 10:37 AM [facility]

## 2023-06-14 ENCOUNTER — Ambulatory Visit: Payer: PPO | Admitting: Cardiovascular Disease

## 2023-06-14 ENCOUNTER — Telehealth: Payer: Self-pay | Admitting: Cardiology

## 2023-06-14 NOTE — Telephone Encounter (Signed)
 Pt c/o medication issue:  1. Name of Medication: prednisone   2. How are you currently taking this medication (dosage and times per day)?    3. Are you having a reaction (difficulty breathing--STAT)? no  4. What is your medication issue? Calling to see if she can take this medication. Been prescribe that for shoulder pain. Please advise

## 2023-06-14 NOTE — Telephone Encounter (Signed)
 Pt advised ok to take.  Advised to call office for guidance if she becomes tachycardic.  Aware we will most likely advised to take PRN Diltiazem, but to call if HR issues arise while taking.  Patient verbalized understanding and agreeable to plan.

## 2023-06-17 DIAGNOSIS — Z85828 Personal history of other malignant neoplasm of skin: Secondary | ICD-10-CM | POA: Diagnosis not present

## 2023-06-17 DIAGNOSIS — L2089 Other atopic dermatitis: Secondary | ICD-10-CM | POA: Diagnosis not present

## 2023-06-21 DIAGNOSIS — I77810 Thoracic aortic ectasia: Secondary | ICD-10-CM | POA: Diagnosis not present

## 2023-06-21 DIAGNOSIS — E1169 Type 2 diabetes mellitus with other specified complication: Secondary | ICD-10-CM | POA: Diagnosis not present

## 2023-06-21 DIAGNOSIS — I700 Atherosclerosis of aorta: Secondary | ICD-10-CM | POA: Diagnosis not present

## 2023-06-21 DIAGNOSIS — G720 Drug-induced myopathy: Secondary | ICD-10-CM | POA: Diagnosis not present

## 2023-06-21 DIAGNOSIS — I10 Essential (primary) hypertension: Secondary | ICD-10-CM | POA: Diagnosis not present

## 2023-06-21 DIAGNOSIS — D6869 Other thrombophilia: Secondary | ICD-10-CM | POA: Diagnosis not present

## 2023-06-21 DIAGNOSIS — E785 Hyperlipidemia, unspecified: Secondary | ICD-10-CM | POA: Diagnosis not present

## 2023-06-21 DIAGNOSIS — K760 Fatty (change of) liver, not elsewhere classified: Secondary | ICD-10-CM | POA: Diagnosis not present

## 2023-06-21 DIAGNOSIS — M810 Age-related osteoporosis without current pathological fracture: Secondary | ICD-10-CM | POA: Diagnosis not present

## 2023-06-21 DIAGNOSIS — I480 Paroxysmal atrial fibrillation: Secondary | ICD-10-CM | POA: Diagnosis not present

## 2023-06-21 DIAGNOSIS — R7989 Other specified abnormal findings of blood chemistry: Secondary | ICD-10-CM | POA: Diagnosis not present

## 2023-06-26 ENCOUNTER — Telehealth: Payer: Self-pay | Admitting: Cardiology

## 2023-06-26 NOTE — Telephone Encounter (Signed)
 Left detailed message advising pt to send her kardia mobile readings for Dr. Elberta Fortis to review.  Aware I will not be in the office until next week to follow up on this, but to call office if she needs anything prior to then

## 2023-06-26 NOTE — Telephone Encounter (Signed)
 Patient is calling because she said she saw something on her cardiac mobile device

## 2023-06-26 NOTE — Telephone Encounter (Signed)
 Spoke with pt regarding her cardia mobile device. Pt stated that last night (3/25) she felt like she was having palpitations so she did an EKG using her cardia mobile device. Pt stated it showed once that she had a wide QRS complex. She used it again and it said twice that the EKGs were unclassified. The pt did an EKG 3 more times and they said normal sinus rhythm. Pt stated she is not having any symptoms, including no chest pain or sob. Pt stated her HR has been between 60-70. The pt stated she has been taking her Metoprolol as directed. Pt was told that her concerns would be forwarded to Dr. Elberta Fortis and his nurse. Pt verbalized understanding. All questions, if any, were answered.

## 2023-06-27 NOTE — Telephone Encounter (Signed)
 Pt was returning nurse call regarding kardia mobile readings. She stated she needs assistance with getting results in. She like to know if she can come by the office or how a nurse can help her. Please advise.

## 2023-06-27 NOTE — Telephone Encounter (Signed)
 Reached out to patient and told her I sent her a MyChart message with instructions on how to send a kardia mobile reading to [place]. She will look that over and see if she can get it to [place]. I told her if all else fails,she can screen shot it and attach the picture to a MyCHart message. Appreciative of call back.

## 2023-06-28 DIAGNOSIS — M25512 Pain in left shoulder: Secondary | ICD-10-CM | POA: Diagnosis not present

## 2023-06-28 NOTE — Telephone Encounter (Signed)
 Pt calling requesting cb.

## 2023-07-01 NOTE — Telephone Encounter (Signed)
 Returned call to patient who states that she couldn't download the [place] mobile reading to [employer]. She has a picture of the concerning EKG strip on her phone and again states she can't get it attached to send in on MyChart. She wants to bring it ina and let someone look at it to be sure it's not concerning. She states she will be dropping by office tomorrow sometime after 9:30am and she understands that she may have to wait, but she'd like Sherri or Camnitz to take a look at it. Will made RN aware.

## 2023-07-01 NOTE — Telephone Encounter (Signed)
 Patient is returning call. Please advise?

## 2023-07-01 NOTE — Telephone Encounter (Signed)
 Spoke to pt.  Aware that we cannot guarantee being able to come out to explain/advise during clinic.  She is just going to print strips and leave them at front desk for MD to review.  Aware it may be next week before follow up on this matter/review.  She is agreeable to plan.

## 2023-07-01 NOTE — Telephone Encounter (Signed)
 Left message to call back

## 2023-07-02 ENCOUNTER — Encounter: Payer: Self-pay | Admitting: Cardiology

## 2023-07-02 NOTE — Telephone Encounter (Signed)
 Pt able to send via mychart

## 2023-07-02 NOTE — Telephone Encounter (Signed)
 Pt called in asking how they should send EKG via pdf. She states she cannot bring up to office or upload via mychart.

## 2023-07-09 DIAGNOSIS — K582 Mixed irritable bowel syndrome: Secondary | ICD-10-CM | POA: Diagnosis not present

## 2023-07-09 DIAGNOSIS — K219 Gastro-esophageal reflux disease without esophagitis: Secondary | ICD-10-CM | POA: Diagnosis not present

## 2023-07-09 DIAGNOSIS — I10 Essential (primary) hypertension: Secondary | ICD-10-CM | POA: Diagnosis not present

## 2023-07-09 DIAGNOSIS — R109 Unspecified abdominal pain: Secondary | ICD-10-CM | POA: Diagnosis not present

## 2023-07-09 DIAGNOSIS — M25562 Pain in left knee: Secondary | ICD-10-CM | POA: Diagnosis not present

## 2023-07-09 DIAGNOSIS — K5900 Constipation, unspecified: Secondary | ICD-10-CM | POA: Diagnosis not present

## 2023-07-09 DIAGNOSIS — G8929 Other chronic pain: Secondary | ICD-10-CM | POA: Diagnosis not present

## 2023-07-09 DIAGNOSIS — R197 Diarrhea, unspecified: Secondary | ICD-10-CM | POA: Diagnosis not present

## 2023-07-09 DIAGNOSIS — D126 Benign neoplasm of colon, unspecified: Secondary | ICD-10-CM | POA: Diagnosis not present

## 2023-07-12 ENCOUNTER — Telehealth: Payer: Self-pay | Admitting: Cardiology

## 2023-07-12 ENCOUNTER — Telehealth: Payer: Self-pay

## 2023-07-12 NOTE — Telephone Encounter (Signed)
   Pre-operative Risk Assessment    Patient Name: Amanda Frost  DOB: Mar 16, 1951 MRN: [id number]   Date of last office visit: 05/06/23 Francis Dowse, PA-C Date of next office visit: 09/09/23 WILL CAMNITZ, MD  Request for Surgical Clearance    Procedure:  Dental Extraction - Amount of Teeth to be Pulled:  1 TOOTH WITH BONE GRAPH   Date of Surgery:  Clearance 08/01/23                                Surgeon:  DR Egbert Garibaldi Surgeon's Group or Practice Name:  [facility] Phone number:  [phone] Fax number:  [phone]   Type of Clearance Requested:   - Medical  - Pharmacy:  Hold Apixaban (Eliquis)     Type of Anesthesia:  Local WITH OPTION OF SED   Additional requests/questions:    Signed, Marlow Baars   07/12/2023, 9:14 AM

## 2023-07-12 NOTE — Telephone Encounter (Signed)
 I s/w the pt and she tells me that the she is also having some dental work done and was not sure if she would need clearance for that as well. I said yes as she tells me the dental procedure is for an extraction. Pt gave the [facility] office of [facility]. I told her that we will call and get the information for the dental procedure.   Pt states the dental procedure is set for 08/01/23. She has a colonoscopy as well though this is not set for a date, just sometime mid May.   We moved tele appt to 07/26/23 to allow clearance and med hold. Pt states she will be out of town until then.

## 2023-07-12 NOTE — Telephone Encounter (Signed)
 Pharmacy please advise on holding Eliquis prior to dental extraction with bone graft scheduled for 08/01/2023. Thank you.

## 2023-07-12 NOTE — Telephone Encounter (Signed)
   Patient Name: Amanda Frost  DOB: [birth year] MRN: [id number]  Primary Cardiologist: Thurmon Fair, MD  Chart reviewed as part of pre-operative protocol coverage.   Patient does not require pre-op antibiotics for dental procedure.   Per office protocol, patient can hold Eliquis for 1 days prior to procedure.   Patient will not need bridging with Lovenox (enoxaparin) around procedure.  I will route this recommendation to the requesting party via Epic fax function and remove from pre-op pool.  Please call with questions.  Napoleon Form, Leodis Rains, NP 07/12/2023, 12:12 PM

## 2023-07-12 NOTE — Telephone Encounter (Signed)
 Patient with diagnosis of atrial fibrillation on Eliquis for anticoagulation.    Procedure:  Dental Extraction - Amount of Teeth to be Pulled:  1 TOOTH WITH BONE GRAPH    Date of Surgery:  Clearance 08/01/23      CHA2DS2-VASc Score = 3   This indicates a 3.2% annual risk of stroke. The patient's score is based upon: CHF History: 0 HTN History: 1 Diabetes History: 0 Stroke History: 0 Vascular Disease History: 0 Age Score: 1 Gender Score: 1   CrCl 79 Platelet count 247  Patient does not require pre-op antibiotics for dental procedure.  Per office protocol, patient can hold Eliquis for 1 days prior to procedure.   Patient will not need bridging with Lovenox (enoxaparin) around procedure.  **This guidance is not considered finalized until pre-operative APP has relayed final recommendations.**

## 2023-07-12 NOTE — Telephone Encounter (Signed)
 Pt would like a c/b regarding Pre Op appt on 4/28. Pt needs to reschedule. Please advise

## 2023-07-17 DIAGNOSIS — K5900 Constipation, unspecified: Secondary | ICD-10-CM | POA: Diagnosis not present

## 2023-07-17 DIAGNOSIS — K219 Gastro-esophageal reflux disease without esophagitis: Secondary | ICD-10-CM | POA: Diagnosis not present

## 2023-07-17 DIAGNOSIS — R109 Unspecified abdominal pain: Secondary | ICD-10-CM | POA: Diagnosis not present

## 2023-07-18 ENCOUNTER — Telehealth: Payer: Self-pay

## 2023-07-18 NOTE — Telephone Encounter (Signed)
   Name: Amanda Frost  DOB: 02-23-1951  MRN: [id number]  Primary Cardiologist: Luana Rumple, MD   Preoperative team, please contact this patient and set up a phone call appointment for further preoperative risk assessment. Please obtain consent and complete medication review. Thank you for your help.   I confirm that guidance regarding antiplatelet and oral anticoagulation therapy has been completed and, if necessary, noted below.  Per office protocol, patient can hold Eliquis for 1 days prior to procedure.   Patient will not need bridging with Lovenox (enoxaparin) around procedure    I also confirmed the patient resides in the state of [place] . As per [facility] Medical Board telemedicine laws, the patient must reside in the state in which the provider is licensed.   Francene Ing, Retha Cast, NP 07/18/2023, 2:29 PM [facility]

## 2023-07-18 NOTE — Telephone Encounter (Signed)
   Pre-operative Risk Assessment    Patient Name: Amanda Frost  DOB: 24-Jul-1950 MRN: [id number]   Date of last office visit: 05/06/2023 Date of next office visit: 09/09/23   Request for Surgical Clearance    Procedure:   colonoscopy  Date of Surgery:  Clearance 08/15/23                                Surgeon:  Dr. Lanita Pitman Surgeon's Group or Practice Name:  [facility] Phone number:  [phone]  Fax number:  [phone]   Type of Clearance Requested:   - Medical  - Pharmacy:  Hold Apixaban (Eliquis) x2 days   Type of Anesthesia:   Propofol   Additional requests/questions:    [name]   07/18/2023, 2:22 PM

## 2023-07-24 ENCOUNTER — Encounter: Payer: Self-pay | Admitting: Cardiovascular Disease

## 2023-07-25 NOTE — Progress Notes (Unsigned)
 Virtual Visit via Telephone Note   Because of [name] co-morbid illnesses, she is at least at moderate risk for complications without adequate follow up.  This format is felt to be most appropriate for this patient at this time.  Due to technical limitations with video connection (technology), today's appointment will be conducted as an audio only telehealth visit, and Amanda Frost verbally agreed to proceed in this manner.   All issues noted in this document were discussed and addressed.  No physical exam could be performed with this format.  Evaluation Performed:  Preoperative cardiovascular risk assessment _____________   Date:  07/25/2023   Patient ID:  Amanda Frost, DOB 12-Jan-1951, MRN [id number] Patient Location:  Home Provider location:   Office  Primary Care Provider:  Aldo Hun, MD Primary Cardiologist:  Luana Rumple, MD  Chief Complaint / Patient Profile   73 y.o. y/o female with a h/o paroxysmal atrial fibrillation, hypertension, PVCs who is pending colonoscopy and presents today for telephonic preoperative cardiovascular risk assessment.  History of Present Illness    Amanda Frost is a 73 y.o. female who presents via Web designer for a telehealth visit today.  Pt was last seen in cardiology clinic on 05/06/2023 by Mertha Abrahams PA-C.  At that time Amanda Frost was doing well .  The patient is now pending procedure as outlined above. Since her last visit, she continues to be stable from a cardiac standpoint.  Today she denies chest pain, shortness of breath, lower extremity edema, fatigue,  melena, hematuria, hemoptysis, diaphoresis, weakness, presyncope, syncope, orthopnea, and PND.   Past Medical History    Past Medical History:  Diagnosis Date   Hypertension    Liver cirrhosis secondary to NASH (nonalcoholic steatohepatitis) (HCC)    Ocular migraine    Palpitations    PAC's,PVC's,atrial tachycardia   Paroxysmal  atrial fibrillation (HCC)    Past Surgical History:  Procedure Laterality Date   ATRIAL FIBRILLATION ABLATION N/A 07/12/2021   Procedure: ATRIAL FIBRILLATION ABLATION;  Surgeon: Lei Pump, MD;  Location: [facility];  Service: Cardiovascular;  Laterality: N/A;   BREAST BIOPSY  1994   CARDIAC CATHETERIZATION  11/05/2007   normal LV fx,patent coronary arteries   NM MYOVIEW  LTD  03/12/2005   normal   US  ECHOCARDIOGRAPHY  11/26/2007   mild LVH,trace MR,TR    Allergies  Allergies  Allergen Reactions   Ciprofloxacin Palpitations and Other (See Comments)    AFIB     Macrodantin [Nitrofurantoin Macrocrystal] Hives   Sulfa Antibiotics Hives   Atorvastatin     joints hurt too much   Epinephrine      Heart races   Sulfur     Home Medications    Prior to Admission medications   Medication Sig Start Date End Date Taking? Authorizing Provider  adapalene (DIFFERIN) 0.1 % cream Apply 1 application  topically daily as needed (acne).    [provider]  apixaban  (ELIQUIS ) 5 MG TABS tablet TAKE 1 TABLET BY MOUTH TWICE A DAY 05/30/23   Croitoru, Mihai, MD  Cholecalciferol (VITAMIN D3) 5000 units CAPS Take 10,000 Units by mouth every Monday, Tuesday, Wednesday, Thursday, and Friday.    [provider]  dicyclomine  (BENTYL ) 20 MG tablet Take 1 tablet (20 mg total) by mouth 3 (three) times daily as needed for spasms (abdominal cramps). Patient not taking: Reported on 04/22/2023 02/25/23   Earma Gloss, MD  diltiazem  (CARDIZEM ) 30 MG tablet Take 1 tablet (30  mg total) by mouth every 6 (six) hours as needed for up to 10 doses (PALPITATIONS, heart rate higher than 120 BPM). 04/26/23   Teddi Favors, DO  metoprolol  succinate (TOPROL -XL) 100 MG 24 hr tablet TAKE 1 TABLET BY MOUTH TWICE A DAY 05/07/23   Nathanel Bal, PA-C  olmesartan (BENICAR) 5 MG tablet Take 5 mg by mouth at bedtime.    [provider]  ondansetron  (ZOFRAN -ODT) 4 MG disintegrating tablet  Take 1 tablet (4 mg total) by mouth every 8 (eight) hours as needed. Patient not taking: Reported on 04/22/2023 02/25/23   Rancour, Mara [place], MD  Semaglutide (OZEMPIC, 0.25 OR 0.5 MG/DOSE, ) Inject 0.5 mg into the skin once a week. Patient not taking: Reported on 06/07/2023    [provider]    Physical Exam    Vital Signs:  Amanda Frost does not have vital signs available for review today.  Given telephonic nature of communication, physical exam is limited. AAOx3. NAD. Normal affect.  Speech and respirations are unlabored.  Accessory Clinical Findings    None  Assessment & Plan    1.  Preoperative Cardiovascular Risk Assessment:Procedure:   colonoscopy   Date of Surgery:  Clearance 08/15/23                                  Surgeon:  Dr. Lanita Pitman Surgeon's Group or Practice Name:  [facility] Phone number:  [phone]          Fax number:  [phone]      Primary Cardiologist: Luana Rumple, MD  Chart reviewed as part of pre-operative protocol coverage. Given past medical history and time since last visit, based on ACC/AHA guidelines, Amanda Frost would be at acceptable risk for the planned procedure without further cardiovascular testing.   She is able to complete greater than 4 METS of physical activity.  Patient was advised that if she develops new symptoms prior to surgery to contact our office to arrange a follow-up appointment.  Amanda Frost verbalized understanding.  Per office protocol, patient can hold Eliquis  for 1 days prior to procedure.   Patient will not need bridging with Lovenox (enoxaparin) around procedure  I will route this recommendation to the requesting party via Epic fax function and remove from pre-op pool.     Time:   Today, I have spent 7 minutes with the patient with telehealth technology discussing medical history, symptoms, and management plan.  I spent 10 minutes reviewing her cardiac medications, cardiac  test, and past medical history.   Carie Charity, NP  07/25/2023, 1:30 PM

## 2023-07-26 ENCOUNTER — Ambulatory Visit: Attending: Cardiology

## 2023-07-26 DIAGNOSIS — Z01810 Encounter for preprocedural cardiovascular examination: Secondary | ICD-10-CM

## 2023-07-29 ENCOUNTER — Ambulatory Visit

## 2023-08-08 DIAGNOSIS — I10 Essential (primary) hypertension: Secondary | ICD-10-CM | POA: Diagnosis not present

## 2023-08-08 DIAGNOSIS — D126 Benign neoplasm of colon, unspecified: Secondary | ICD-10-CM | POA: Diagnosis not present

## 2023-08-08 DIAGNOSIS — K219 Gastro-esophageal reflux disease without esophagitis: Secondary | ICD-10-CM | POA: Diagnosis not present

## 2023-08-08 DIAGNOSIS — H9012 Conductive hearing loss, unilateral, left ear, with unrestricted hearing on the contralateral side: Secondary | ICD-10-CM | POA: Diagnosis not present

## 2023-08-08 DIAGNOSIS — H6122 Impacted cerumen, left ear: Secondary | ICD-10-CM | POA: Diagnosis not present

## 2023-08-27 DIAGNOSIS — R7401 Elevation of levels of liver transaminase levels: Secondary | ICD-10-CM | POA: Diagnosis not present

## 2023-08-27 DIAGNOSIS — R142 Eructation: Secondary | ICD-10-CM | POA: Diagnosis not present

## 2023-08-27 DIAGNOSIS — R109 Unspecified abdominal pain: Secondary | ICD-10-CM | POA: Diagnosis not present

## 2023-08-27 DIAGNOSIS — D369 Benign neoplasm, unspecified site: Secondary | ICD-10-CM | POA: Diagnosis not present

## 2023-08-27 DIAGNOSIS — R198 Other specified symptoms and signs involving the digestive system and abdomen: Secondary | ICD-10-CM | POA: Diagnosis not present

## 2023-09-07 NOTE — Progress Notes (Unsigned)
  Electrophysiology Office Note:   Date:  09/07/2023  ID:  Amanda Frost, DOB Feb 14, 1951, MRN [id number]  Primary Cardiologist: Luana Rumple, MD Primary Heart Failure: None Electrophysiologist: Nour Scalise Cortland Ding, MD  {Click to update primary MD,subspecialty MD or APP then REFRESH:1}    History of Present Illness:   Amanda Frost is a 73 y.o. female with h/o cirrhosis due to nonalcoholic steatohepatitis, hypertension, atrial fibrillation, atrial tachycardia seen today for routine electrophysiology followup.   Since last being seen in our clinic the patient reports doing ***.  she denies chest pain, palpitations, dyspnea, PND, orthopnea, nausea, vomiting, dizziness, syncope, edema, weight gain, or early satiety.   Review of systems complete and found to be negative unless listed in HPI.   EP Information / Studies Reviewed:    {EKGtoday:28818}        Risk Assessment/Calculations:    CHA2DS2-VASc Score = 3  {Confirm score is correct.  If not, click here to update score.  REFRESH note.  :1} This indicates a 3.2% annual risk of stroke. The patient's score is based upon: CHF History: 0 HTN History: 1 Diabetes History: 0 Stroke History: 0 Vascular Disease History: 0 Age Score: 1 Gender Score: 1   {This patient has a significant risk of stroke if diagnosed with atrial fibrillation.  Please consider VKA or DOAC agent for anticoagulation if the bleeding risk is acceptable.   You can also use the SmartPhrase .[email or web address] for documentation.   :[id number]}        Physical Exam:   VS:  There were no vitals taken for this visit.   Wt Readings from Last 3 Encounters:  05/06/23 168 lb (76.2 kg)  04/26/23 169 lb (76.7 kg)  04/22/23 169 lb (76.7 kg)     GEN: Well nourished, well developed in no acute distress NECK: No JVD; No carotid bruits CARDIAC: {EPRHYTHM:28826}, no murmurs, rubs, gallops RESPIRATORY:  Clear to auscultation without rales, wheezing or rhonchi  ABDOMEN:  Soft, non-tender, non-distended EXTREMITIES:  No edema; No deformity   ASSESSMENT AND PLAN:    1.  Paroxysmal atrial fibrillation: Post ablation 07/13/2021.  Has had 1 episode of atrial fibrillation since ablation.***  2.  Secondary hypercoagulable state: On Eliquis   3.  Hypertension:***  4.  PVCs: Minimal symptoms.  Low burden on monitor.  Follow up with {EPMDS:28135::"EP Team"} {EPFOLLOW NW:29562}  Signed, Tuwanna Krausz Cortland Ding, MD

## 2023-09-09 ENCOUNTER — Encounter: Payer: Self-pay | Admitting: Cardiology

## 2023-09-09 ENCOUNTER — Ambulatory Visit: Payer: PPO | Attending: Cardiology | Admitting: Cardiology

## 2023-09-09 VITALS — BP 145/78 | HR 68 | Ht 67.0 in | Wt 170.0 lb

## 2023-09-09 DIAGNOSIS — I480 Paroxysmal atrial fibrillation: Secondary | ICD-10-CM

## 2023-09-09 DIAGNOSIS — I10 Essential (primary) hypertension: Secondary | ICD-10-CM | POA: Diagnosis not present

## 2023-09-09 DIAGNOSIS — G4733 Obstructive sleep apnea (adult) (pediatric): Secondary | ICD-10-CM | POA: Diagnosis not present

## 2023-09-09 DIAGNOSIS — I493 Ventricular premature depolarization: Secondary | ICD-10-CM | POA: Diagnosis not present

## 2023-09-09 NOTE — Patient Instructions (Signed)
 Medication Instructions:  Your physician recommends that you continue on your current medications as directed. Please refer to the Current Medication list given to you today.  *If you need a refill on your cardiac medications before your next appointment, please call your pharmacy*  Lab Work: None ordered   Testing/Procedures: None ordered  Follow-Up: At [facility], you and your health needs are our priority.  As part of our continuing mission to provide you with exceptional heart care, our providers are all part of one team.  This team includes your primary Cardiologist (physician) and Advanced Practice Providers or APPs (Physician Assistants and Nurse Practitioners) who all work together to provide you with the care you need, when you need it.  Your next appointment:   1 year(s)  Provider:   You will follow up in the Atrial Fibrillation Clinic located at [facility]. Your provider will be: Clint R. Fenton, PA-C or Minnie Amber, PA-C    Thank you for choosing [employer]!!   Amanda Cane, RN [phone]

## 2023-09-16 DIAGNOSIS — E1169 Type 2 diabetes mellitus with other specified complication: Secondary | ICD-10-CM | POA: Diagnosis not present

## 2023-09-17 DIAGNOSIS — Z860101 Personal history of adenomatous and serrated colon polyps: Secondary | ICD-10-CM | POA: Diagnosis not present

## 2023-09-17 DIAGNOSIS — Z09 Encounter for follow-up examination after completed treatment for conditions other than malignant neoplasm: Secondary | ICD-10-CM | POA: Diagnosis not present

## 2023-09-17 DIAGNOSIS — K219 Gastro-esophageal reflux disease without esophagitis: Secondary | ICD-10-CM | POA: Diagnosis not present

## 2023-09-17 DIAGNOSIS — D122 Benign neoplasm of ascending colon: Secondary | ICD-10-CM | POA: Diagnosis not present

## 2023-09-17 DIAGNOSIS — D123 Benign neoplasm of transverse colon: Secondary | ICD-10-CM | POA: Diagnosis not present

## 2023-09-17 DIAGNOSIS — K449 Diaphragmatic hernia without obstruction or gangrene: Secondary | ICD-10-CM | POA: Diagnosis not present

## 2023-09-20 DIAGNOSIS — D122 Benign neoplasm of ascending colon: Secondary | ICD-10-CM | POA: Diagnosis not present

## 2023-09-20 DIAGNOSIS — D123 Benign neoplasm of transverse colon: Secondary | ICD-10-CM | POA: Diagnosis not present

## 2023-09-27 ENCOUNTER — Telehealth: Payer: Self-pay | Admitting: Cardiology

## 2023-09-27 DIAGNOSIS — I480 Paroxysmal atrial fibrillation: Secondary | ICD-10-CM

## 2023-09-27 MED ORDER — APIXABAN 5 MG PO TABS: 5.0000 mg | ORAL_TABLET | Freq: Two times a day (BID) | ORAL | 1 refills | Status: AC

## 2023-09-27 NOTE — Telephone Encounter (Signed)
 Prescription refill request for Eliquis  received. Indication: Afib  Last office visit: 09/09/23 (Camnitz)  Scr: 0.77 (04/26/23)  Age: 73 Weight: 77.1kg  Appropriate dose. Refill sent.

## 2023-09-27 NOTE — Telephone Encounter (Signed)
*  STAT* If patient is at the pharmacy, call can be transferred to refill team.   1. Which medications need to be refilled? (please list name of each medication and dose if known) apixaban  (ELIQUIS ) 5 MG TABS tablet   2. Which pharmacy/location (including street and city if local pharmacy) is medication to be sent to? [id number] - [place], [place] - [address]    3. Do they need a 30 day or 90 day supply?  90 day supply

## 2023-10-10 DIAGNOSIS — W228XXA Striking against or struck by other objects, initial encounter: Secondary | ICD-10-CM | POA: Diagnosis not present

## 2023-10-10 DIAGNOSIS — S098XXA Other specified injuries of head, initial encounter: Secondary | ICD-10-CM | POA: Diagnosis not present

## 2023-10-10 DIAGNOSIS — S0990XA Unspecified injury of head, initial encounter: Secondary | ICD-10-CM | POA: Diagnosis not present

## 2023-11-29 DIAGNOSIS — R142 Eructation: Secondary | ICD-10-CM | POA: Diagnosis not present

## 2023-11-29 DIAGNOSIS — R109 Unspecified abdominal pain: Secondary | ICD-10-CM | POA: Diagnosis not present

## 2023-11-29 DIAGNOSIS — R7989 Other specified abnormal findings of blood chemistry: Secondary | ICD-10-CM | POA: Diagnosis not present

## 2023-11-29 DIAGNOSIS — R198 Other specified symptoms and signs involving the digestive system and abdomen: Secondary | ICD-10-CM | POA: Diagnosis not present

## 2023-11-29 DIAGNOSIS — D369 Benign neoplasm, unspecified site: Secondary | ICD-10-CM | POA: Diagnosis not present

## 2023-11-29 DIAGNOSIS — R7401 Elevation of levels of liver transaminase levels: Secondary | ICD-10-CM | POA: Diagnosis not present

## 2023-12-05 DIAGNOSIS — I10 Essential (primary) hypertension: Secondary | ICD-10-CM | POA: Diagnosis not present

## 2023-12-05 DIAGNOSIS — N390 Urinary tract infection, site not specified: Secondary | ICD-10-CM | POA: Diagnosis not present

## 2023-12-05 DIAGNOSIS — E1169 Type 2 diabetes mellitus with other specified complication: Secondary | ICD-10-CM | POA: Diagnosis not present

## 2023-12-05 DIAGNOSIS — R350 Frequency of micturition: Secondary | ICD-10-CM | POA: Diagnosis not present

## 2023-12-13 DIAGNOSIS — H40013 Open angle with borderline findings, low risk, bilateral: Secondary | ICD-10-CM | POA: Diagnosis not present

## 2023-12-13 DIAGNOSIS — H25813 Combined forms of age-related cataract, bilateral: Secondary | ICD-10-CM | POA: Diagnosis not present

## 2023-12-13 DIAGNOSIS — E119 Type 2 diabetes mellitus without complications: Secondary | ICD-10-CM | POA: Diagnosis not present

## 2023-12-13 DIAGNOSIS — H524 Presbyopia: Secondary | ICD-10-CM | POA: Diagnosis not present

## 2023-12-26 ENCOUNTER — Other Ambulatory Visit: Payer: Self-pay

## 2023-12-26 ENCOUNTER — Emergency Department (HOSPITAL_BASED_OUTPATIENT_CLINIC_OR_DEPARTMENT_OTHER)

## 2023-12-26 ENCOUNTER — Encounter (HOSPITAL_BASED_OUTPATIENT_CLINIC_OR_DEPARTMENT_OTHER): Payer: Self-pay

## 2023-12-26 ENCOUNTER — Emergency Department (HOSPITAL_BASED_OUTPATIENT_CLINIC_OR_DEPARTMENT_OTHER)
Admission: EM | Admit: 2023-12-26 | Discharge: 2023-12-26 | Disposition: A | Discharge status: Home / Self Care | Attending: Emergency Medicine | Admitting: Emergency Medicine

## 2023-12-26 DIAGNOSIS — R519 Headache, unspecified: Secondary | ICD-10-CM | POA: Diagnosis not present

## 2023-12-26 DIAGNOSIS — F430 Acute stress reaction: Secondary | ICD-10-CM | POA: Insufficient documentation

## 2023-12-26 DIAGNOSIS — F439 Reaction to severe stress, unspecified: Secondary | ICD-10-CM

## 2023-12-26 DIAGNOSIS — Z7901 Long term (current) use of anticoagulants: Secondary | ICD-10-CM | POA: Diagnosis not present

## 2023-12-26 MED ORDER — ALPRAZOLAM 0.5 MG PO TABS
0.2500 mg | ORAL_TABLET | Freq: Once | ORAL | Status: AC
  Administered 2023-12-26: 0.25 mg via ORAL
  Filled 2023-12-26: qty 1

## 2023-12-26 NOTE — ED Triage Notes (Signed)
 Patient states she woke up 1 hour ago with a throbbing headache on the left side. States it feels like a dull ache behind her left eye. Patient states she checked her blood pressure and it was 170/98. Patient took her normal night time medications at 2300 including olmesartan, metoprolol , eliquis . Patient takes eliquis  for Afib. Patient had an ablation 2.5 years ago. Denies numbness or tingling. Denies slurred speech or difficulty ambulating. Denies changes in her vision. Patient states she took tylenol  45 minutes ago and the pain is now gone. 0/10 pain at present. LKW: 2330 when she went to bed.

## 2023-12-26 NOTE — ED Provider Notes (Signed)
 [place] EMERGENCY DEPARTMENT AT [facility] Provider Note   CSN: [id number] Arrival date & time: 12/26/23  [id number]     Patient presents with: Headache   Amanda Frost is a 73 y.o. female.   Patient presents to the emergency department for evaluation of headache.  Patient reports that she had an argument with family members earlier today, was feeling stressed from the and noticed that her blood pressure was elevated.  She took her nighttime medications and went to bed.  Prior to going to bed, blood pressure came down from 180s to 130s.  She then woke up tonight with a throbbing left-sided headache behind the left eye.  Patient reports taking Tylenol  prior to coming to the emergency department.  At arrival, headache has resolved.  Patient denies vision change.  No jaw claudication.  No facial pain.  She has not noticed any unilateral stroke symptoms.       Prior to Admission medications   Medication Sig Start Date End Date Taking? Authorizing Provider  apixaban  (ELIQUIS ) 5 MG TABS tablet Take 1 tablet (5 mg total) by mouth 2 (two) times daily. 09/27/23  Yes Camnitz, Soyla Lunger, MD  Cholecalciferol (VITAMIN D3) 5000 units CAPS Take 10,000 Units by mouth every Monday, Tuesday, Wednesday, Thursday, and Friday.   Yes [provider]  metoprolol  succinate (TOPROL -XL) 100 MG 24 hr tablet TAKE 1 TABLET BY MOUTH TWICE A DAY 05/07/23  Yes Terra Fairy PARAS, PA-C  olmesartan (BENICAR) 5 MG tablet Take 5 mg by mouth at bedtime.   Yes [provider]  adapalene (DIFFERIN) 0.1 % cream Apply 1 application  topically daily as needed (acne).    [provider]  dicyclomine  (BENTYL ) 20 MG tablet Take 1 tablet (20 mg total) by mouth 3 (three) times daily as needed for spasms (abdominal cramps). Patient not taking: Reported on 09/09/2023 02/25/23   Carita Senior, MD  diltiazem  (CARDIZEM ) 30 MG tablet Take 1 tablet (30 mg total) by mouth every 6 (six) hours as needed  for up to 10 doses (PALPITATIONS, heart rate higher than 120 BPM). 04/26/23   Elnor Jayson LABOR, DO  ondansetron  (ZOFRAN -ODT) 4 MG disintegrating tablet Take 1 tablet (4 mg total) by mouth every 8 (eight) hours as needed. Patient not taking: Reported on 09/09/2023 02/25/23   Rancour, Senior, MD  Semaglutide (OZEMPIC, 0.25 OR 0.5 MG/DOSE, [place]) Inject 0.5 mg into the skin once a week. Patient not taking: Reported on 05/06/2023    [provider]    Allergies: Ciprofloxacin, Macrodantin [nitrofurantoin macrocrystal], Sulfa antibiotics, Atorvastatin, Epinephrine , and Sulfur    Review of Systems  Updated Vital Signs BP (!) 181/103   Pulse 64   Temp 98.1 F (36.7 C) (Oral)   Resp 15   Ht 5' 8 (1.727 m)   Wt 77.1 kg   SpO2 95%   BMI 25.85 kg/m   Physical Exam Vitals and nursing note reviewed.  Constitutional:      General: She is not in acute distress.    Appearance: She is well-developed.  HENT:     Head: Normocephalic and atraumatic.     Mouth/Throat:     Mouth: Mucous membranes are moist.  Eyes:     General: Vision grossly intact. Gaze aligned appropriately.     Extraocular Movements: Extraocular movements intact.     Conjunctiva/sclera: Conjunctivae normal.  Cardiovascular:     Rate and Rhythm: Normal rate and regular rhythm.     Pulses: Normal pulses.  Heart sounds: Normal heart sounds, S1 normal and S2 normal. No murmur heard.    No friction rub. No gallop.  Pulmonary:     Effort: Pulmonary effort is normal. No respiratory distress.     Breath sounds: Normal breath sounds.  Abdominal:     General: Bowel sounds are normal.     Palpations: Abdomen is soft.     Tenderness: There is no abdominal tenderness. There is no guarding or rebound.     Hernia: No hernia is present.  Musculoskeletal:        General: No swelling.     Cervical back: Full passive range of motion without pain, normal range of motion and neck supple. No spinous process tenderness or muscular  tenderness. Normal range of motion.     Right lower leg: No edema.     Left lower leg: No edema.  Skin:    General: Skin is warm and dry.     Capillary Refill: Capillary refill takes less than 2 seconds.     Findings: No ecchymosis, erythema, rash or wound.  Neurological:     General: No focal deficit present.     Mental Status: She is alert and oriented to person, place, and time.     GCS: GCS eye subscore is 4. GCS verbal subscore is 5. GCS motor subscore is 6.     Cranial Nerves: Cranial nerves 2-12 are intact.     Sensory: Sensation is intact.     Motor: Motor function is intact.     Coordination: Coordination is intact.     Comments: Extraocular muscle movement: normal No visual field cut Pupils: equal and reactive both direct and consensual response is normal No nystagmus present    Sensory function is intact to light touch, pinprick Proprioception intact  Grip strength 5/5 symmetric in upper extremities No pronator drift Normal finger to nose bilaterally  Lower extremity strength 5/5 against gravity Normal heel to shin bilaterally  Gait: normal   Psychiatric:        Attention and Perception: Attention normal.        Mood and Affect: Mood normal.        Speech: Speech normal.        Behavior: Behavior normal.     (all labs ordered are listed, but only abnormal results are displayed) Labs Reviewed - No data to display  EKG: None  Radiology: CT HEAD WO CONTRAST ( ) Result Date: 12/26/2023 EXAM: CT HEAD WITHOUT CONTRAST 12/26/2023 04:03:55 AM TECHNIQUE: CT of the head was performed without the administration of intravenous contrast. Automated exposure control, iterative reconstruction, and/or weight based adjustment of the mA/kV was utilized to reduce the radiation dose to as low as reasonably achievable. COMPARISON: CT of the head dated Jul 14, 2021. CLINICAL HISTORY: Patient presents with a new onset headache, specifically a throbbing headache on the left side,  with a blood pressure of 170/98. FINDINGS: BRAIN AND VENTRICLES: No acute hemorrhage. No evidence of acute infarct. No hydrocephalus. No extra-axial collection. No mass effect or midline shift. ORBITS: No acute abnormality. SINUSES: No acute abnormality. SOFT TISSUES AND SKULL: No acute soft tissue abnormality. No skull fracture. IMPRESSION: 1. No acute intracranial abnormality. Electronically signed by: Evalene Coho MD 12/26/2023 04:17 AM EDT RP Workstation: [id number]     Procedures   Medications Ordered in the ED  ALPRAZolam  (XANAX ) tablet 0.25 mg (0.25 mg Oral Given 12/26/23 0357)  Medical Decision Making Amount and/or Complexity of Data Reviewed Radiology: ordered and independent interpretation performed. Decision-making details documented in ED Course.  Risk Prescription drug management.   Differential Diagnosis considered includes, but not limited to: Hypertensive emergencies, Idiopathic intracranial hypertension, Space occupying lesions (tumors, abscesses, cysts), Acute hydrocephalus, Dural sinus thrombosis, Intracranial hemorrhage, Cerebrovascular accident or stroke, Meningitis and encephalitis, Migraine HA, Tension HA.  Patient presents for evaluation of elevated blood pressure and headache.  She did take her nighttime blood pressure medications.  Blood pressure was noted to be elevated at bedtime after having some stressful interactions with family members.  Pressure had been coming down, however.  She awakened with a left-sided headache.  She took Tylenol  and the headache has now resolved.  Patient has a normal neurologic exam.  She does take Eliquis  because of atrial fibrillation.  CT head without acute bleed.  No other abnormality noted.  Neurologic exam is entirely normal.  Patient without vision change, jaw claudication or worrying history that would raise concern for temporal arteritis.  No stroke symptoms.  Patient reassured, no  further workup necessary.  Could continue to monitor her blood pressure at home and follow-up with primary care.     Final diagnoses:  Bad headache    ED Discharge Orders     None          Haze Lonni PARAS, MD 12/26/23 [phone]

## 2024-01-20 ENCOUNTER — Other Ambulatory Visit: Payer: Self-pay | Admitting: Gastroenterology

## 2024-01-20 DIAGNOSIS — R7989 Other specified abnormal findings of blood chemistry: Secondary | ICD-10-CM

## 2024-02-04 DIAGNOSIS — M65341 Trigger finger, right ring finger: Secondary | ICD-10-CM | POA: Diagnosis not present

## 2024-02-06 ENCOUNTER — Ambulatory Visit
Admission: RE | Admit: 2024-02-06 | Discharge: 2024-02-06 | Disposition: A | Source: Ambulatory Visit | Attending: Gastroenterology

## 2024-02-06 DIAGNOSIS — R945 Abnormal results of liver function studies: Secondary | ICD-10-CM | POA: Diagnosis not present

## 2024-02-06 DIAGNOSIS — R7989 Other specified abnormal findings of blood chemistry: Secondary | ICD-10-CM

## 2024-02-13 ENCOUNTER — Other Ambulatory Visit: Payer: Self-pay | Admitting: Gastroenterology

## 2024-02-13 DIAGNOSIS — R935 Abnormal findings on diagnostic imaging of other abdominal regions, including retroperitoneum: Secondary | ICD-10-CM

## 2024-02-26 DIAGNOSIS — R82998 Other abnormal findings in urine: Secondary | ICD-10-CM | POA: Diagnosis not present

## 2024-03-30 ENCOUNTER — Ambulatory Visit
Admission: RE | Admit: 2024-03-30 | Discharge: 2024-03-30 | Disposition: A | Discharge status: Home / Self Care | Source: Ambulatory Visit | Attending: Gastroenterology | Admitting: Gastroenterology

## 2024-03-30 DIAGNOSIS — R935 Abnormal findings on diagnostic imaging of other abdominal regions, including retroperitoneum: Secondary | ICD-10-CM

## 2024-03-30 MED ORDER — GADOPICLENOL 0.5 MMOL/ML IV SOLN
7.0000 mL | Freq: Once | INTRAVENOUS | Status: AC | PRN
  Administered 2024-03-30: 7 mL via INTRAVENOUS

## 2024-04-10 ENCOUNTER — Encounter (HOSPITAL_BASED_OUTPATIENT_CLINIC_OR_DEPARTMENT_OTHER)

## 2024-04-13 ENCOUNTER — Ambulatory Visit: Admitting: Primary Care

## 2024-04-13 ENCOUNTER — Encounter (HOSPITAL_BASED_OUTPATIENT_CLINIC_OR_DEPARTMENT_OTHER)

## 2024-04-16 ENCOUNTER — Other Ambulatory Visit

## 2024-04-21 ENCOUNTER — Ambulatory Visit (HOSPITAL_BASED_OUTPATIENT_CLINIC_OR_DEPARTMENT_OTHER)

## 2024-04-21 ENCOUNTER — Ambulatory Visit
Admission: RE | Admit: 2024-04-21 | Discharge: 2024-04-21 | Disposition: A | Source: Ambulatory Visit | Attending: Pulmonary Disease

## 2024-04-21 DIAGNOSIS — J849 Interstitial pulmonary disease, unspecified: Secondary | ICD-10-CM | POA: Diagnosis not present

## 2024-04-21 LAB — PULMONARY FUNCTION TEST
DL/VA % pred: 104 %
DL/VA: 4.2 ml/min/mmHg/L
DLCO unc % pred: 92 %
DLCO unc: 19.87 ml/min/mmHg
FEF 25-75 Post: 2.91 L/s
FEF 25-75 Pre: 2.72 L/s
FEF2575-%Change-Post: 6 %
FEF2575-%Pred-Post: 149 %
FEF2575-%Pred-Pre: 139 %
FEV1-%Change-Post: 0 %
FEV1-%Pred-Post: 106 %
FEV1-%Pred-Pre: 106 %
FEV1-Post: 2.65 L
FEV1-Pre: 2.64 L
FEV1FVC-%Change-Post: 1 %
FEV1FVC-%Pred-Pre: 108 %
FEV6-%Change-Post: 0 %
FEV6-%Pred-Post: 102 %
FEV6-%Pred-Pre: 101 %
FEV6-Post: 3.21 L
FEV6-Pre: 3.21 L
FEV6FVC-%Change-Post: 0 %
FEV6FVC-%Pred-Post: 104 %
FEV6FVC-%Pred-Pre: 103 %
FVC-%Change-Post: 0 %
FVC-%Pred-Post: 97 %
FVC-%Pred-Pre: 98 %
FVC-Post: 3.22 L
FVC-Pre: 3.24 L
Post FEV1/FVC ratio: 82 %
Post FEV6/FVC ratio: 100 %
Pre FEV1/FVC ratio: 82 %
Pre FEV6/FVC Ratio: 99 %
RV % pred: 421 %
RV: 10.17 L
TLC % pred: 239 %
TLC: 13.29 L

## 2024-04-21 NOTE — Patient Instructions (Signed)
 Full PFT performed today.

## 2024-04-21 NOTE — Progress Notes (Signed)
 Full PFT performed today.

## 2024-04-24 ENCOUNTER — Encounter: Payer: Self-pay | Admitting: Pulmonary Disease

## 2024-04-24 ENCOUNTER — Ambulatory Visit: Admitting: Pulmonary Disease

## 2024-04-24 VITALS — BP 148/88 | HR 68 | Temp 97.9°F | Ht 68.0 in | Wt 173.0 lb

## 2024-04-24 DIAGNOSIS — R918 Other nonspecific abnormal finding of lung field: Secondary | ICD-10-CM

## 2024-04-24 DIAGNOSIS — J849 Interstitial pulmonary disease, unspecified: Secondary | ICD-10-CM

## 2024-04-24 NOTE — Progress Notes (Signed)
 "              Amanda Frost    [id number]    22-Sep-1950  Primary Care Physician:Perini, Oneil, MD  Referring Physician: Shayne Oneil, MD [address] [place],  [place] [address]  Chief complaint: Follow up for abnormal CT, lung nodules  HPI: 74 y.o. who  has a past medical history of Hypertension, Liver cirrhosis secondary to NASH (nonalcoholic steatohepatitis) (HCC), Ocular migraine, Palpitations, and Paroxysmal atrial fibrillation (HCC).   Referred here for abnormal CT, pulmonary nodule.  Had a lesion CT performed on 01/06/2022 for follow-up of pulmonary nodule showed mild groundglass opacities in the mid to lower lungs and has been referred here for further evaluation Overall she feels well with no dyspnea, cough, chest congestion  Interim History: Discussed the use of AI scribe software for clinical note transcription with the patient, who gave verbal consent to proceed.  History of Present Illness Amanda Frost is a 74 year old female who presents for follow-up of her lung condition.  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  She has a feather pillow in her living room couch Smoking history: Never smoker Travel history: No significant travel history Relevant family history: No family history of lung disease  Outpatient Encounter Medications as of 04/24/2024  Medication Sig   apixaban  (ELIQUIS ) 5 MG  TABS tablet Take 1 tablet (5 mg total) by mouth 2 (two) times daily.   metoprolol  succinate (TOPROL -XL) 100 MG 24 hr tablet TAKE 1 TABLET BY MOUTH TWICE A DAY   olmesartan (BENICAR) 5 MG tablet Take 5 mg by mouth at bedtime.   adapalene (DIFFERIN) 0.1 % cream Apply 1 application  topically daily as needed (acne). (Patient not taking: Reported on 04/24/2024)   Cholecalciferol (VITAMIN D3) 5000 units CAPS Take 10,000 Units by mouth every Monday, Tuesday, Wednesday, Thursday, and Friday. (Patient not taking: Reported on 04/24/2024)   dicyclomine  (BENTYL ) 20 MG tablet Take 1 tablet (20 mg total) by mouth 3 (three) times daily as needed for spasms (abdominal cramps). (Patient not taking: Reported on 04/24/2024)   diltiazem  (CARDIZEM ) 30 MG tablet Take 1 tablet (30 mg total) by mouth every 6 (six) hours as needed for up to 10 doses (PALPITATIONS, heart rate higher than 120 BPM). (Patient not taking: Reported on 04/24/2024)   ondansetron  (ZOFRAN -ODT) 4 MG disintegrating tablet Take 1 tablet (4 mg total) by mouth every 8 (eight) hours as needed. (Patient not taking: Reported on 04/24/2024)   Semaglutide (OZEMPIC, 0.25 OR 0.5 MG/DOSE, [place]) Inject 0.5 mg into the skin once a week. (Patient not taking: Reported on 04/24/2024)   No facility-administered encounter medications on file as of 04/24/2024.   Vitals:   04/24/24 0847 04/24/24 0935  BP: (!) 159/88 (!) 148/88  Pulse: 68   Temp: 97.9 F (36.6 C)   Height: 5' 8 (1.727 m)   Weight: 173 lb (78.5 kg)   SpO2: 97%   TempSrc: Oral  BMI (Calculated): 26.31      Physical Exam GEN: No acute distress. CV: Regular rate and rhythm, no murmurs. LUNGS: Clear to auscultation bilaterally, normal respiratory effort. SKIN JOINTS: Warm and dry, no rash.    Data Reviewed: Imaging: CT chest 12/27/2021-stable pulmonary nodules compared to 2022 High-res CT 01/06/2022-mild groundglass opacities in the mid to lower lungs, stable pulmonary nodules High resolution CT  09/19/2022-mild bibasilar subpleural groundglass minimally progressive High-resolution CT 04/21/2024-stable mild basilar subpleural ground glass opacities.  Indeterminate pattern I have reviewed the images personally.  PFTs: 04/19/2023 FVC 3.28 [98%], FEV1 2.65 [104%], F/F81, TLC 12.62 [227%, DLCO 21.14 [98% Normal test.  Elevated lung volumes are likely an error due to inability to complete test  04/21/2024 FVC 3.22 [97%], FEV1 2.65 [106%], F/F82, TLC 13.29 [239%],, DLCO 18.87 [92%] Elevated lung volumes unclear etiology.  Likely an error in testing  Labs: CTD serologies 06/08/2022-ANA 1: 40 nuclear homogeneous Assessment & Plan Evaluation for interstitial lung disease Mild non specific changes on CT suggestive of inflammation or scarring. Borderline elevation in ANA, but no other indicators of autoimmune disease. Possible exposure to feather pillow. Lung volumes are markedly elevated which is not physiologically possible.  This is likely an error in testing.  No evidence of interstitial lung disease. Breathing is normal, and she can perform activities such as climbing stairs without dyspnea. Condition is well-managed with no progression. - Will repeat CT scan and PFTs in one year to monitor stability. - If stable for a few years, will consider extending CT scan interval to every other year.   Plan/Recommendations: Follow-up in 1 year with repeat CT, PFTs  Lonna Coder MD [facility] 04/24/2024, 9:16 AM  CC: Shayne Anes, MD    "

## 2024-04-24 NOTE — Patient Instructions (Signed)
" °  VISIT SUMMARY: Today we discussed your lung condition and recent back surgery. Your lung function is stable, and you are able to perform activities without shortness of breath. Your leg pain is improving after the surgery.  YOUR PLAN: MILD BASAL PULMONARY FIBROSIS: You have very mild changes at the base of your lungs, likely from past pneumonia or bronchial issues. Your lung function tests are normal and stable, and you have no difficulty breathing. -We will repeat a CT scan in one year to monitor the stability of your lung condition. -If your condition remains stable for a few years, we may extend the interval between CT scans to every other year.    Contains text generated by [employer].   "
